# Patient Record
Sex: Female | Born: 1960
Health system: Southern US, Community
[De-identification: ages and names within clinical notes are randomized; demographics above are authoritative.]

## PROBLEM LIST (undated history)

## (undated) DIAGNOSIS — I10 Essential (primary) hypertension: Secondary | ICD-10-CM

## (undated) DIAGNOSIS — M1990 Unspecified osteoarthritis, unspecified site: Secondary | ICD-10-CM

## (undated) DIAGNOSIS — J349 Unspecified disorder of nose and nasal sinuses: Secondary | ICD-10-CM

## (undated) DIAGNOSIS — M2550 Pain in unspecified joint: Secondary | ICD-10-CM

## (undated) DIAGNOSIS — N644 Mastodynia: Secondary | ICD-10-CM

## (undated) DIAGNOSIS — K219 Gastro-esophageal reflux disease without esophagitis: Secondary | ICD-10-CM

## (undated) DIAGNOSIS — U071 COVID-19: Secondary | ICD-10-CM

## (undated) HISTORY — DX: Pain in unspecified joint: M25.50

## (undated) HISTORY — DX: Mastodynia: N64.4

## (undated) HISTORY — DX: COVID-19: U07.1

## (undated) HISTORY — PX: COLONOSCOPY: SHX174

## (undated) HISTORY — DX: Unspecified disorder of nose and nasal sinuses: J34.9

## (undated) HISTORY — DX: Unspecified osteoarthritis, unspecified site: M19.90

---

## 2003-11-02 ENCOUNTER — Ambulatory Visit: Payer: Self-pay | Admitting: Family Medicine

## 2003-11-07 ENCOUNTER — Ambulatory Visit: Payer: Self-pay | Admitting: Family Medicine

## 2004-11-28 ENCOUNTER — Ambulatory Visit: Payer: Self-pay | Admitting: Family Medicine

## 2005-05-16 ENCOUNTER — Ambulatory Visit: Payer: Self-pay | Admitting: Internal Medicine

## 2005-06-16 ENCOUNTER — Ambulatory Visit: Payer: Self-pay

## 2006-01-08 ENCOUNTER — Ambulatory Visit: Payer: Self-pay

## 2007-02-11 ENCOUNTER — Ambulatory Visit: Payer: Self-pay | Admitting: Family Medicine

## 2007-07-08 DIAGNOSIS — J309 Allergic rhinitis, unspecified: Secondary | ICD-10-CM | POA: Insufficient documentation

## 2008-02-15 ENCOUNTER — Ambulatory Visit: Payer: Self-pay | Admitting: Family Medicine

## 2009-01-27 HISTORY — PX: COLONOSCOPY: SHX174

## 2009-02-15 ENCOUNTER — Ambulatory Visit: Payer: Self-pay

## 2009-04-17 ENCOUNTER — Ambulatory Visit: Payer: Self-pay | Admitting: Gastroenterology

## 2009-04-17 LAB — HM COLONOSCOPY

## 2010-03-13 ENCOUNTER — Ambulatory Visit: Payer: Self-pay

## 2011-03-19 ENCOUNTER — Ambulatory Visit: Payer: Self-pay

## 2011-09-25 ENCOUNTER — Ambulatory Visit: Payer: Self-pay | Admitting: Internal Medicine

## 2012-03-23 ENCOUNTER — Ambulatory Visit: Payer: Self-pay

## 2012-11-11 ENCOUNTER — Ambulatory Visit: Payer: Self-pay | Admitting: Internal Medicine

## 2013-04-01 ENCOUNTER — Ambulatory Visit: Payer: Self-pay

## 2014-04-03 ENCOUNTER — Ambulatory Visit: Payer: Self-pay | Admitting: Family Medicine

## 2014-06-19 ENCOUNTER — Other Ambulatory Visit: Payer: Self-pay | Admitting: Otolaryngology

## 2014-06-19 DIAGNOSIS — R1310 Dysphagia, unspecified: Secondary | ICD-10-CM

## 2014-06-29 ENCOUNTER — Ambulatory Visit
Admission: RE | Admit: 2014-06-29 | Discharge: 2014-06-29 | Disposition: A | Discharge status: Home / Self Care | Payer: BLUE CROSS/BLUE SHIELD | Source: Ambulatory Visit | Attending: Otolaryngology | Admitting: Otolaryngology

## 2014-06-29 DIAGNOSIS — R1310 Dysphagia, unspecified: Secondary | ICD-10-CM | POA: Insufficient documentation

## 2014-06-29 DIAGNOSIS — R1314 Dysphagia, pharyngoesophageal phase: Secondary | ICD-10-CM

## 2014-06-29 NOTE — Therapy (Addendum)
[facility] DIAGNOSTIC RADIOLOGY [address] [place], [place], [address] Phone: [phone]   Fax:     Modified Barium Swallow  Patient Details  Name: Susan Allison MRN: [id number] Date of Birth: 1961/01/14 Referring Provider:  Bud Face, MD  Encounter Date: 06/29/2014   Subjective: Patient behavior: (alertness, ability to follow instructions, etc.): Pt A/O x4, verbally conversive. Followed all instructions.  Chief complaint: feeling of having to swallow "over something" when swallowing during meals and even w/ just saliva; further description of globus feeling. Pt denied any overt s/s of aspiration (coughing or choking w/ po's) or declined respiratory status in the past 6-12 months.   Objective:  Radiological Procedure: A videoflouroscopic evaluation of oral-preparatory, reflex initiation, and pharyngeal phases of the swallow was performed; as well as a screening of the upper esophageal phase.  I. POSTURE: upright II. VIEW: lateral; A-P  III. COMPENSATORY STRATEGIES: none IV. BOLUSES ADMINISTERED:  Thin Liquid: 6 via cup  Nectar-thick Liquid: 1 via cup  Honey-thick Liquid: n/a  Puree: 3 via tsp  Mechanical Soft: 2 trials V. RESULTS OF EVALUATION: A. ORAL PREPARATORY PHASE: (The lips, tongue, and velum are observed for strength and coordination)       **Overall Severity Rating: WFL.  Pt demo. adequate oral phase c/b good bolus control and timely oral control/management w/ timely A-P transfer of bolus material; appropriate oral clearing post swallowing.  B. SWALLOW INITIATION/REFLEX: (The reflex is normal if "triggered" by the time the bolus reached the base of the tongue)  **Overall Severity Rating: [facility].  Pt exhibited adequate pharyngeal swallow initiation w/ all consistencies; thin liquids tended to trigger the swallow at/slightly spilling from the valleculae. No laryngeal penetration or aspiration occurred during this study.    C. PHARYNGEAL PHASE: (Pharyngeal function is normal if the bolus shows rapid, smooth, and continuous transit through the pharynx and there is no pharyngeal residue after the swallow)  **Overall Severity Rating: [employer].  Bolus material cleared the pharynx w/ each trial consistency; no residue remained indicating adequate laryngeal excursion and pharyngeal pressure.   D. LARYNGEAL PENETRATION: (Material entering into the laryngeal inlet/vestibule but not aspirated): NONE  E. ASPIRATION: NONE F. ESOPHAGEAL PHASE: (Screening of the upper esophagus): Pt exhibited decreased Esophageal motility w/ trials of solids; bolus material remained in the Esophagus longer.  G. ASSESSMENT:  Pt appeared to present w/ a wfl oropharyngeal swallow function during this exam today. No oral phase deficits noted, and pharyngeal phase of swallowing was adequate in it's timeliness of the swallow initiation as well as the bolus transit and clearing of the pharynx. During the Esophageal phase, pt exhibited decreased Esophageal motility w/ trials of solids; bolus material remained in the Esophagus longer. This may be impacting the pharyngeal phase of pt's swallowing giving her the "globus feeling" she describes as her complaint bringing her in for this study today. Education was given on the Esophageal phase of swallowing; pt viewed the study results as well. General education was given on aspiration and Reflux precautions.    PLAN/RECOMMENDATIONS:  A. Diet: Regular; thin liquids  B. Swallowing Precautions: general aspiration precautions; Reflux precautions  C. Recommended consultation to GI for full Esophageal assessment as indicated  D. Therapy recommendations: none  E. Results and recommendations were explained to pt; faxed to MD.      End of Session - 06/29/14 1426    Visit Number 1   Number of Visits 1   SLP Start Time 1300   SLP Stop  Time  1400   SLP Time Calculation (min) 60 min   Activity Tolerance Patient  tolerated treatment well      No past medical history on file.  No past surgical history on file.  There were no vitals filed for this visit.  Visit Diagnosis: Pharyngoesophageal dysphagia  Dysphagia - Plan: DG SWALLOWING FUNC-SPEECH PATHOLOGY, DG SWALLOWING FUNC-SPEECH PATHOLOGY                               Problem List There are no active problems to display for this patient.   Mccormick Macon 06/29/2014, 2:32 PM  [facility] DIAGNOSTIC RADIOLOGY [address] [place], [place], [address] Phone: [phone]   Fax:

## 2014-08-14 ENCOUNTER — Encounter
Admission: RE | Disposition: A | Discharge status: Home Health | Payer: Self-pay | Source: Ambulatory Visit | Attending: Gastroenterology

## 2014-08-14 ENCOUNTER — Ambulatory Visit: Payer: BLUE CROSS/BLUE SHIELD | Admitting: Anesthesiology

## 2014-08-14 ENCOUNTER — Ambulatory Visit
Admission: RE | Admit: 2014-08-14 | Discharge: 2014-08-14 | Disposition: A | Discharge status: Home Health | Payer: BLUE CROSS/BLUE SHIELD | Source: Ambulatory Visit | Attending: Gastroenterology | Admitting: Gastroenterology

## 2014-08-14 ENCOUNTER — Encounter: Payer: Self-pay | Admitting: Anesthesiology

## 2014-08-14 DIAGNOSIS — R1310 Dysphagia, unspecified: Secondary | ICD-10-CM | POA: Insufficient documentation

## 2014-08-14 DIAGNOSIS — K317 Polyp of stomach and duodenum: Secondary | ICD-10-CM | POA: Insufficient documentation

## 2014-08-14 DIAGNOSIS — K219 Gastro-esophageal reflux disease without esophagitis: Secondary | ICD-10-CM | POA: Insufficient documentation

## 2014-08-14 DIAGNOSIS — K224 Dyskinesia of esophagus: Secondary | ICD-10-CM | POA: Diagnosis not present

## 2014-08-14 DIAGNOSIS — R1013 Epigastric pain: Secondary | ICD-10-CM | POA: Diagnosis present

## 2014-08-14 DIAGNOSIS — Z79899 Other long term (current) drug therapy: Secondary | ICD-10-CM | POA: Diagnosis not present

## 2014-08-14 HISTORY — DX: Gastro-esophageal reflux disease without esophagitis: K21.9

## 2014-08-14 HISTORY — PX: ESOPHAGOGASTRODUODENOSCOPY: SHX5428

## 2014-08-14 SURGERY — EGD (ESOPHAGOGASTRODUODENOSCOPY)
Anesthesia: General

## 2014-08-14 MED ORDER — FENTANYL CITRATE (PF) 100 MCG/2ML IJ SOLN
INTRAMUSCULAR | Status: DC | PRN
  Administered 2014-08-14: 50 ug via INTRAVENOUS

## 2014-08-14 MED ORDER — SODIUM CHLORIDE 0.9 % IV SOLN
INTRAVENOUS | Status: DC
  Administered 2014-08-14: 13:00:00 via INTRAVENOUS

## 2014-08-14 MED ORDER — SODIUM CHLORIDE 0.9 % IV SOLN: INTRAVENOUS | Status: DC

## 2014-08-14 MED ORDER — PROPOFOL 10 MG/ML IV BOLUS
INTRAVENOUS | Status: DC | PRN
  Administered 2014-08-14: 50 mg via INTRAVENOUS

## 2014-08-14 MED ORDER — PROPOFOL INFUSION 10 MG/ML OPTIME
INTRAVENOUS | Status: DC | PRN
  Administered 2014-08-14: 75 ug/kg/min via INTRAVENOUS

## 2014-08-14 NOTE — Anesthesia Preprocedure Evaluation (Addendum)
Anesthesia Evaluation  Patient identified by MRN, date of birth, ID band Patient awake    Reviewed: Allergy & Precautions, H&P , NPO status , Patient's Chart, lab work & pertinent test results, reviewed documented beta blocker date and time   Airway Mallampati: II  TM Distance: >3 FB Neck ROM: full    Dental no notable dental hx. (+) Teeth Intact   Pulmonary neg pulmonary ROS,  breath sounds clear to auscultation  Pulmonary exam normal       Cardiovascular negative cardio ROS Normal cardiovascular examRhythm:regular Rate:Normal     Neuro/Psych negative neurological ROS  negative psych ROS   GI/Hepatic Neg liver ROS, GERD-  Controlled and Medicated,  Endo/Other  negative endocrine ROS  Renal/GU negative Renal ROS  negative genitourinary   Musculoskeletal   Abdominal   Peds  Hematology negative hematology ROS (+)   Anesthesia Other Findings Past Medical History:   Ear infection                                                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   Dental Advisory Given  Plan Discussed with: Anesthesiologist, CRNA and Surgeon  Anesthesia Plan Comments:        Anesthesia Quick Evaluation

## 2014-08-14 NOTE — Transfer of Care (Signed)
Immediate Anesthesia Transfer of Care Note  Patient: Susan Allison  Procedure(s) Performed: Procedure(s): ESOPHAGOGASTRODUODENOSCOPY (EGD) (N/A)  Patient Location: PACU and Endoscopy Unit  Anesthesia Type:General  Level of Consciousness: sedated  Airway & Oxygen Therapy: Patient Spontanous Breathing  Post-op Assessment: Report given to RN  Post vital signs: stable  Last Vitals:  Filed Vitals:   08/14/14 1350  BP: 101/60  Pulse: 79  Temp: 35.7 C  Resp: 21    Complications: No apparent anesthesia complications

## 2014-08-14 NOTE — Op Note (Signed)
[facility] Gastroenterology Patient Name: Susan Allison Procedure Date: 08/14/2014 1:06 PM MRN: [id number] Account #: [id number] Date of Birth: 09/17/1960 Admit Type: Outpatient Age: 54 [facility] ENDO ROOM 4 Gender: Female Note Status: Finalized Procedure:         Upper GI endoscopy Indications:       Epigastric abdominal pain, Dysphagia, Suspected esophageal                     reflux Providers:         Ezzard StandingPaul Y. Bluford Kaufmannh, MD Referring MD:      Serita ShellerErnest B. Maryellen PileEason, MD (Referring MD) Medicines:         Monitored Anesthesia Care Complications:     No immediate complications. Procedure:         Pre-Anesthesia Assessment:                    - Prior to the procedure, a History and Physical was                     performed, and patient medications, allergies and                     sensitivities were reviewed. The patient's tolerance of                     previous anesthesia was reviewed.                    - The risks and benefits of the procedure and the sedation                     options and risks were discussed with the patient. All                     questions were answered and informed consent was obtained.                    - After reviewing the risks and benefits, the patient was                     deemed in satisfactory condition to undergo the procedure.                    After obtaining informed consent, the endoscope was passed                     under direct vision. Throughout the procedure, the                     patient's blood pressure, pulse, and oxygen saturations                     were monitored continuously. The Olympus GIF-160 endoscope                     (S#. [id number]) was introduced through the mouth, and                     advanced to the second part of duodenum. The upper GI                     endoscopy was accomplished without difficulty. The patient  tolerated the procedure well. Findings:      No endoscopic  abnormality was evident in the esophagus to explain the       patient's complaint of dysphagia. It was decided, however, to proceed       with dilation of the entire esophagus. The scope was withdrawn. Dilation       was performed with a Maloney dilator with mild resistance at 54 Fr.      A few small sessile polyps were found in the gastric body.      The exam was otherwise without abnormality.      The examined duodenum was normal. Impression:        - No endoscopic esophageal abnormality to explain                     patient's dysphagia. Esophagus dilated. Dilated.                    - A few gastric polyps.                    - The examination was otherwise normal.                    - Normal examined duodenum.                    - No specimens collected. Recommendation:    - Discharge patient to home.                    - Observe patient's clinical course.                    - The findings and recommendations were discussed with the                     patient.                    - Switch to daily PPI Procedure Code(s): --- Professional ---                    [phone], Esophagogastroduodenoscopy, flexible, transoral;                     diagnostic, including collection of specimen(s) by                     brushing or washing, when performed (separate procedure)                    43450, Dilation of esophagus, by unguided sound or bougie,                     single or multiple passes Diagnosis Code(s): --- Professional ---                    R13.10, Dysphagia, unspecified                    K31.7, Polyp of stomach and duodenum                    R10.13, Epigastric pain CPT copyright 2014 American Medical Association. All rights reserved. The codes documented in this report are preliminary and upon coder review may  be revised to meet current compliance requirements. [name], MD 08/14/2014 1:38:10 PM This report has been signed  electronically. Number of Addenda: 0 Note Initiated  On: 08/14/2014 1:06 PM      [facility]

## 2014-08-14 NOTE — H&P (Signed)
  Date of Initial H&P: 07/20/2014 History reviewed, patient examined, no change in status, stable for surgery. 

## 2014-08-14 NOTE — Anesthesia Postprocedure Evaluation (Signed)
  Anesthesia Post-op Note  Patient: Susan Allison  Procedure(s) Performed: Procedure(s): ESOPHAGOGASTRODUODENOSCOPY (EGD) (N/A)  Anesthesia type:General  Patient location: PACU  Post pain: Pain level controlled  Post assessment: Post-op Vital signs reviewed, Patient's Cardiovascular Status Stable, Respiratory Function Stable, Patent Airway and No signs of Nausea or vomiting  Post vital signs: Reviewed and stable  Last Vitals:  Filed Vitals:   08/14/14 1420  BP: 120/74  Pulse: 75  Temp:   Resp: 18    Level of consciousness: awake, alert  and patient cooperative  Complications: No apparent anesthesia complications

## 2014-08-21 ENCOUNTER — Encounter: Payer: Self-pay | Admitting: Gastroenterology

## 2015-02-27 LAB — HM PAP SMEAR: HM PAP: NORMAL

## 2015-03-14 ENCOUNTER — Other Ambulatory Visit: Payer: Self-pay | Admitting: Obstetrics and Gynecology

## 2015-03-14 DIAGNOSIS — Z1231 Encounter for screening mammogram for malignant neoplasm of breast: Secondary | ICD-10-CM

## 2015-04-04 ENCOUNTER — Ambulatory Visit
Admission: RE | Admit: 2015-04-04 | Discharge: 2015-04-04 | Disposition: A | Discharge status: Home / Self Care | Payer: BLUE CROSS/BLUE SHIELD | Source: Ambulatory Visit | Attending: Obstetrics and Gynecology | Admitting: Obstetrics and Gynecology

## 2015-04-04 DIAGNOSIS — Z1231 Encounter for screening mammogram for malignant neoplasm of breast: Secondary | ICD-10-CM | POA: Diagnosis not present

## 2016-03-04 ENCOUNTER — Other Ambulatory Visit: Payer: Self-pay | Admitting: Obstetrics and Gynecology

## 2016-03-20 DIAGNOSIS — Z01419 Encounter for gynecological examination (general) (routine) without abnormal findings: Secondary | ICD-10-CM | POA: Diagnosis not present

## 2016-03-20 DIAGNOSIS — Z1239 Encounter for other screening for malignant neoplasm of breast: Secondary | ICD-10-CM | POA: Diagnosis not present

## 2016-03-31 ENCOUNTER — Other Ambulatory Visit: Payer: Self-pay | Admitting: Obstetrics and Gynecology

## 2016-03-31 DIAGNOSIS — Z1231 Encounter for screening mammogram for malignant neoplasm of breast: Secondary | ICD-10-CM

## 2016-04-15 DIAGNOSIS — K5900 Constipation, unspecified: Secondary | ICD-10-CM | POA: Diagnosis not present

## 2016-04-15 DIAGNOSIS — D500 Iron deficiency anemia secondary to blood loss (chronic): Secondary | ICD-10-CM | POA: Diagnosis not present

## 2016-04-15 DIAGNOSIS — Z0000 Encounter for general adult medical examination without abnormal findings: Secondary | ICD-10-CM | POA: Diagnosis not present

## 2016-04-15 DIAGNOSIS — E559 Vitamin D deficiency, unspecified: Secondary | ICD-10-CM | POA: Diagnosis not present

## 2016-04-15 DIAGNOSIS — E782 Mixed hyperlipidemia: Secondary | ICD-10-CM | POA: Diagnosis not present

## 2016-04-15 DIAGNOSIS — K760 Fatty (change of) liver, not elsewhere classified: Secondary | ICD-10-CM | POA: Diagnosis not present

## 2016-04-23 ENCOUNTER — Other Ambulatory Visit: Payer: Self-pay | Admitting: Otolaryngology

## 2016-04-23 DIAGNOSIS — H9209 Otalgia, unspecified ear: Secondary | ICD-10-CM | POA: Diagnosis not present

## 2016-04-29 ENCOUNTER — Ambulatory Visit
Admission: RE | Admit: 2016-04-29 | Discharge: 2016-04-29 | Disposition: A | Discharge status: Home / Self Care | Payer: BLUE CROSS/BLUE SHIELD | Source: Ambulatory Visit | Attending: Obstetrics and Gynecology | Admitting: Obstetrics and Gynecology

## 2016-04-29 ENCOUNTER — Encounter: Payer: Self-pay | Admitting: Obstetrics and Gynecology

## 2016-04-29 DIAGNOSIS — Z1231 Encounter for screening mammogram for malignant neoplasm of breast: Secondary | ICD-10-CM | POA: Diagnosis not present

## 2016-04-30 ENCOUNTER — Ambulatory Visit
Admission: RE | Admit: 2016-04-30 | Discharge: 2016-04-30 | Disposition: A | Discharge status: Home / Self Care | Payer: BLUE CROSS/BLUE SHIELD | Source: Ambulatory Visit | Attending: Otolaryngology | Admitting: Otolaryngology

## 2016-04-30 DIAGNOSIS — R930 Abnormal findings on diagnostic imaging of skull and head, not elsewhere classified: Secondary | ICD-10-CM | POA: Diagnosis not present

## 2016-04-30 DIAGNOSIS — H9202 Otalgia, left ear: Secondary | ICD-10-CM | POA: Insufficient documentation

## 2016-04-30 DIAGNOSIS — R51 Headache: Secondary | ICD-10-CM | POA: Diagnosis not present

## 2016-04-30 DIAGNOSIS — H9209 Otalgia, unspecified ear: Secondary | ICD-10-CM

## 2016-04-30 MED ORDER — IOPAMIDOL (ISOVUE-300) INJECTION 61%
75.0000 mL | Freq: Once | INTRAVENOUS | Status: AC | PRN
Start: 2016-04-30 — End: 2016-04-30
  Administered 2016-04-30: 75 mL via INTRAVENOUS

## 2016-06-25 DIAGNOSIS — H40003 Preglaucoma, unspecified, bilateral: Secondary | ICD-10-CM | POA: Diagnosis not present

## 2016-07-02 DIAGNOSIS — H40003 Preglaucoma, unspecified, bilateral: Secondary | ICD-10-CM | POA: Diagnosis not present

## 2016-09-23 DIAGNOSIS — H6982 Other specified disorders of Eustachian tube, left ear: Secondary | ICD-10-CM | POA: Diagnosis not present

## 2017-02-27 DIAGNOSIS — M1990 Unspecified osteoarthritis, unspecified site: Secondary | ICD-10-CM | POA: Diagnosis not present

## 2017-02-27 DIAGNOSIS — R031 Nonspecific low blood-pressure reading: Secondary | ICD-10-CM | POA: Diagnosis not present

## 2017-02-27 DIAGNOSIS — R011 Cardiac murmur, unspecified: Secondary | ICD-10-CM | POA: Diagnosis not present

## 2017-03-03 ENCOUNTER — Ambulatory Visit: Payer: Self-pay | Admitting: Medical

## 2017-03-03 VITALS — BP 118/78 | HR 80 | Temp 97.5°F | Resp 16 | Wt 129.8 lb

## 2017-03-03 DIAGNOSIS — Z0130 Encounter for examination of blood pressure without abnormal findings: Secondary | ICD-10-CM

## 2017-03-03 DIAGNOSIS — Z8669 Personal history of other diseases of the nervous system and sense organs: Secondary | ICD-10-CM

## 2017-03-03 NOTE — Progress Notes (Signed)
   Subjective:    Patient ID: Susan Allison, female    DOB: [birth year], 57 y.o.   MRN: [id number]  HPI  57 yo female in non acute distress Came in for blood pressure check for her doctor . Wanted ears checked for wax.   Review of Systems  body aches being worked up for  Rhematoid Arthritis  by her doctor.  Pending lab work. Sister with a hix of RA.    Objective:   Physical Exam    Bilateral ears within normal limits no wax.    Assessment & Plan:  Ear check , blood pressure check Return to the clinic as needed for blood pressure check per your doctor.  BP 118/78 today. Return to clinic as needed. Patient verbalizes understanding and has no questions at this time.

## 2017-03-23 ENCOUNTER — Encounter: Payer: Self-pay | Admitting: Obstetrics and Gynecology

## 2017-03-23 ENCOUNTER — Ambulatory Visit (INDEPENDENT_AMBULATORY_CARE_PROVIDER_SITE_OTHER): Payer: BLUE CROSS/BLUE SHIELD | Admitting: Obstetrics and Gynecology

## 2017-03-23 VITALS — BP 138/96 | Ht 59.0 in | Wt 131.0 lb

## 2017-03-23 DIAGNOSIS — Z1231 Encounter for screening mammogram for malignant neoplasm of breast: Secondary | ICD-10-CM | POA: Diagnosis not present

## 2017-03-23 DIAGNOSIS — Z01419 Encounter for gynecological examination (general) (routine) without abnormal findings: Secondary | ICD-10-CM

## 2017-03-23 DIAGNOSIS — Z1239 Encounter for other screening for malignant neoplasm of breast: Secondary | ICD-10-CM

## 2017-03-23 NOTE — Progress Notes (Signed)
PCP: Derwood KaplanEason, Ernest B, MD   Chief Complaint  Patient presents with  . Annual Exam    HPI:      [name] is a 57 y.o. [phone] who LMP was No LMP recorded. Patient is postmenopausal., presents today for her annual examination.  Her menses are absent due to menopause. She does not have intermenstrual bleeding. She does have occas vasomotor sx.   Sex activity: single partner, contraception - post menopausal status. She does not have vaginal dryness.  Last Pap: February 27, 2015  Results were: no abnormalities /neg HPV DNA.  Hx of STDs: none  Last mammogram: April 29, 2016  Results were: normal--routine follow-up in 12 months There is a FH of breast cancer in her mat niece, genetic testing not indicated for pt. There is no FH of ovarian cancer. The patient does do self-breast exams.  Colonoscopy: colonoscopy 8 years ago without abnormalities.  Repeat due after 5 years.   Tobacco use: The patient denies current or previous tobacco use. Alcohol use: none Exercise: moderately active  She does get adequate calcium and Vitamin D in her diet.  Labs with PCP. Having some elevated BP readings recently.   Past Medical History:  Diagnosis Date  . Ear infection   . GERD (gastroesophageal reflux disease)   . Mastodynia     Past Surgical History:  Procedure Laterality Date  . COLONOSCOPY    . ESOPHAGOGASTRODUODENOSCOPY N/A 08/14/2014   Procedure: ESOPHAGOGASTRODUODENOSCOPY (EGD);  Surgeon: Wallace CullensPaul Y Oh, MD;  [facility];  Service: Gastroenterology;  Laterality: N/A;    Family History  Problem Relation Age of Onset  . Breast cancer Other        40's  . Hypertension Mother   . Liver disease Mother   . Prostate cancer Father     Social History   Socioeconomic History  . Marital status: Married    Spouse name: Not on file  . Number of children: Not on file  . Years of education: Not on file  . Highest education level: Not on file  Social Needs  . Financial  resource strain: Not on file  . Food insecurity - worry: Not on file  . Food insecurity - inability: Not on file  . Transportation needs - medical: Not on file  . Transportation needs - non-medical: Not on file  Occupational History  . Not on file  Tobacco Use  . Smoking status: Never Smoker  . Smokeless tobacco: Never Used  Substance and Sexual Activity  . Alcohol use: No  . Drug use: No  . Sexual activity: Yes    Birth control/protection: Post-menopausal  Other Topics Concern  . Not on file  Social History Narrative  . Not on file    Current Meds  Medication Sig  . calcium carbonate (OS-CAL) 600 MG TABS tablet Take 600 mg by mouth 2 (two) times daily with a meal.  . cetirizine (ZYRTEC) 10 MG tablet Take 10 mg by mouth daily.  . Vitamin D, Ergocalciferol, (DRISDOL) 50000 units CAPS capsule Take 50,000 Units by mouth every 7 (seven) days.      ROS:  Review of Systems  Constitutional: Negative for fatigue, fever and unexpected weight change.  Respiratory: Negative for cough, shortness of breath and wheezing.   Cardiovascular: Negative for chest pain, palpitations and leg swelling.  Gastrointestinal: Negative for blood in stool, constipation, diarrhea, nausea and vomiting.  Endocrine: Negative for cold intolerance, heat intolerance and polyuria.  Genitourinary: Negative for  dyspareunia, dysuria, flank pain, frequency, genital sores, hematuria, menstrual problem, pelvic pain, urgency, vaginal bleeding, vaginal discharge and vaginal pain.  Musculoskeletal: Positive for arthralgias. Negative for back pain, joint swelling and myalgias.  Skin: Negative for rash.  Neurological: Positive for headaches. Negative for dizziness, syncope, light-headedness and numbness.  Hematological: Negative for adenopathy.  Psychiatric/Behavioral: Negative for agitation, confusion, sleep disturbance and suicidal ideas. The patient is not nervous/anxious.      Objective: BP (!) 138/96   Ht 4'  11" (1.499 m)   Wt 131 lb (59.4 kg)   BMI 26.46 kg/m    Physical Exam  Constitutional: She is oriented to person, place, and time. She appears well-developed and well-nourished.  Genitourinary: Vagina normal and uterus normal. There is no rash or tenderness on the right labia. There is no rash or tenderness on the left labia. No erythema or tenderness in the vagina. No vaginal discharge found. Right adnexum does not display mass and does not display tenderness. Left adnexum does not display mass and does not display tenderness. Cervix does not exhibit motion tenderness or polyp. Uterus is not enlarged or tender.  Neck: Normal range of motion. No thyromegaly present.  Cardiovascular: Normal rate, regular rhythm and normal heart sounds.  No murmur heard. Pulmonary/Chest: Effort normal and breath sounds normal. Right breast exhibits no mass, no nipple discharge, no skin change and no tenderness. Left breast exhibits no mass, no nipple discharge, no skin change and no tenderness.  Abdominal: Soft. There is no tenderness. There is no guarding.  Musculoskeletal: Normal range of motion.  Neurological: She is alert and oriented to person, place, and time. No cranial nerve deficit.  Psychiatric: She has a normal mood and affect. Her behavior is normal.  Vitals reviewed.  Assessment/Plan:  Encounter for annual routine gynecological examination  Screening for breast cancer - Pt to sched mammo. - Plan: MM DIGITAL SCREENING BILATERAL          GYN counsel breast self exam, mammography screening, menopause, adequate intake of calcium and vitamin D, diet and exercise    F/U  Return in about 1 year (around 03/23/2018).  Alicia B. Copland, PA-C 03/23/2017 8:34 AM

## 2017-03-23 NOTE — Patient Instructions (Addendum)
I value your feedback and entrusting us with your care. If you get a [facility] patient survey, I would appreciate you taking the time to let us know about your experience today. Thank you!  [facility]: [phone]  [facility]: [phone]  

## 2017-04-01 DIAGNOSIS — R030 Elevated blood-pressure reading, without diagnosis of hypertension: Secondary | ICD-10-CM | POA: Diagnosis not present

## 2017-04-09 DIAGNOSIS — R748 Abnormal levels of other serum enzymes: Secondary | ICD-10-CM | POA: Diagnosis not present

## 2017-04-09 DIAGNOSIS — I10 Essential (primary) hypertension: Secondary | ICD-10-CM | POA: Diagnosis not present

## 2017-04-28 ENCOUNTER — Ambulatory Visit
Admission: RE | Admit: 2017-04-28 | Discharge: 2017-04-28 | Disposition: A | Discharge status: Home / Self Care | Payer: BLUE CROSS/BLUE SHIELD | Source: Ambulatory Visit | Attending: Medical | Admitting: Medical

## 2017-04-28 ENCOUNTER — Ambulatory Visit: Payer: BLUE CROSS/BLUE SHIELD | Admitting: Medical

## 2017-04-28 VITALS — BP 142/75 | HR 77 | Temp 97.5°F | Resp 16 | Wt 125.8 lb

## 2017-04-28 DIAGNOSIS — M79642 Pain in left hand: Secondary | ICD-10-CM

## 2017-04-28 MED ORDER — PREDNISONE 10 MG (21) PO TBPK: ORAL_TABLET | ORAL | 6 refills | Status: DC

## 2017-04-28 NOTE — Progress Notes (Signed)
   Subjective:    Patient ID: Susan Allison, female    DOB: 1960-08-17, 57 y.o.   MRN: [id number]  HPI 57 yo female in non acute distress.  Presents to day with complaints of left hand starting " for a while" 6 months historyt per patient. Tingling  In hand  all fingers.Swelling in base of fingers 1-3 rd fingers and into hand.  Patient is left handed. Has noticed some burning in fingers with using hand during work.   Review of Systems  Constitutional: Positive for chills (last week hot and cold went home early last week , resolved.). Negative for fever.  HENT: Positive for congestion and ear pain (left ear radiates from her  left TMJ). Negative for ear discharge and sore throat.   Respiratory: Negative for cough (dry cough, started the lisinopril started it 3 weeks ago  started by Dr. Laural BenesJohnson).    Dr. Maryellen PileEason retired and she just started seeing Dr. Laural BenesJohnson.  Sister with Rheumatoid Arthritis. Patient  was tested by her doctor. And she states the  tests came back negative.      Objective:   Physical Exam  Constitutional: She is oriented to person, place, and time.  Cardiovascular: Normal rate, regular rhythm and normal heart sounds.  Pulmonary/Chest: Effort normal and breath sounds normal. No respiratory distress. She has no wheezes. She has no rales.  Neurological: She is alert and oriented to person, place, and time.  Skin: Skin is warm and dry.  Psychiatric: She has a normal mood and affect. Her behavior is normal. Judgment and thought content normal.   No cough noted in room 5/5 grip strength  Most painful at the  2nd metacarpal with the most swelling and more painful than the  1st or 2nd metacarpal which have minimal swelling. No erthyma, some warmth. Able to have  FROM of fingers and able to oppose thumb without difficulty.  Tinels test negtive Phalens test  Painful at  2nd metacarpal site not into fingers).    Assessment & Plan:  Left hand pain  ( mostly at the 2nd  metacarpal). Most likely arthritis or tendonitis  x-ray  Of left hand, Will call patient in the morning with results. Meds ordered this encounter  Medications  . predniSONE (STERAPRED UNI-PAK 21 TAB) 10 MG (21) TBPK tablet    Sig: Take  6 tablets by mouth today then  5 tablets tomorrow then one less each day thereafter. Take with food.    Dispense:  21 tablet    Refill:  6  changed to  0 refills. I called pharmacy. [employer]  1st shift.  Using right hand more at work, declines restrictive note for work at this point.  Call your Dr. Laural BenesJohnson for recheck on hypertensive medication (cough to make follow up appointment,) and to recheck on her left hand.  CLINICAL DATA:  Worsening pain in the first, second, and third metacarpals.  EXAM: LEFT HAND - COMPLETE 3+ VIEW  COMPARISON:  None.  FINDINGS: No evidence for an acute fracture. No subluxation or dislocation. Foreshortening of the fourth metacarpal is chronic and may be developmental or related to remote trauma. No worrisome lytic or sclerotic osseous abnormality.  IMPRESSION: No acute bony abnormality.   Electronically Signed   By: Kennith CenterEric  Mansell M.D.  Called patient with results. Patient to follow up on  05/08/17. Patient verbalizes understanding and has no questions at discharge.

## 2017-04-28 NOTE — Patient Instructions (Signed)
Arthritis Arthritis means joint pain. It can also mean joint disease. A joint is a place where bones come together. People who have arthritis may have:  Red joints.  Swollen joints.  Stiff joints.  Warm joints.  A fever.  A feeling of being sick.  Follow these instructions at home: Pay attention to any changes in your symptoms. Take these actions to help with your pain and swelling. Medicines  Take over-the-counter and prescription medicines only as told by your doctor.  Do not take aspirin for pain if your doctor says that you may have gout. Activity  Rest your joint if your doctor tells you to.  Avoid activities that make the pain worse.  Exercise your joint regularly as told by your doctor. Try doing exercises like: ? Swimming. ? Water aerobics. ? Biking. ? Walking. Joint Care   If your joint is swollen, keep it raised (elevated) if told by your doctor.  If your joint feels stiff in the morning, try taking a warm shower.  If you have diabetes, do not apply heat without asking your doctor.  If told, apply heat to the joint: ? Put a towel between the joint and the hot pack or heating pad. ? Leave the heat on the area for 20-30 minutes.  If told, apply ice to the joint: ? Put ice in a plastic bag. ? Place a towel between your skin and the bag. ? Leave the ice on for 20 minutes, 2-3 times per day.  Keep all follow-up visits as told by your doctor. Contact a doctor if:  The pain gets worse.  You have a fever. Get help right away if:  You have very bad pain in your joint.  You have swelling in your joint.  Your joint is red.  Many joints become painful and swollen.  You have very bad back pain.  Your leg is very weak.  You cannot control your pee (urine) or poop (stool). This information is not intended to replace advice given to you by your health care provider. Make sure you discuss any questions you have with your health care provider. Document  Released: 04/09/2009 Document Revised: 06/21/2015 Document Reviewed: 04/10/2014 Elsevier Interactive Patient Education  2018 [employer]  

## 2017-04-29 ENCOUNTER — Telehealth: Payer: Self-pay | Admitting: *Deleted

## 2017-04-30 ENCOUNTER — Ambulatory Visit
Admission: RE | Admit: 2017-04-30 | Discharge: 2017-04-30 | Disposition: A | Discharge status: Home / Self Care | Payer: BLUE CROSS/BLUE SHIELD | Source: Ambulatory Visit | Attending: Obstetrics and Gynecology | Admitting: Obstetrics and Gynecology

## 2017-04-30 DIAGNOSIS — Z1239 Encounter for other screening for malignant neoplasm of breast: Secondary | ICD-10-CM

## 2017-04-30 DIAGNOSIS — Z1231 Encounter for screening mammogram for malignant neoplasm of breast: Secondary | ICD-10-CM | POA: Insufficient documentation

## 2017-05-01 ENCOUNTER — Encounter: Payer: Self-pay | Admitting: Obstetrics and Gynecology

## 2017-05-08 ENCOUNTER — Ambulatory Visit: Payer: BLUE CROSS/BLUE SHIELD | Admitting: Adult Health

## 2017-05-08 VITALS — BP 141/66 | HR 84 | Temp 97.9°F | Resp 16 | Ht 60.0 in | Wt 127.0 lb

## 2017-05-08 DIAGNOSIS — I10 Essential (primary) hypertension: Secondary | ICD-10-CM

## 2017-05-08 DIAGNOSIS — Z0189 Encounter for other specified special examinations: Secondary | ICD-10-CM

## 2017-05-08 DIAGNOSIS — M25532 Pain in left wrist: Secondary | ICD-10-CM | POA: Insufficient documentation

## 2017-05-08 DIAGNOSIS — R202 Paresthesia of skin: Secondary | ICD-10-CM | POA: Insufficient documentation

## 2017-05-08 MED ORDER — LISINOPRIL-HYDROCHLOROTHIAZIDE 10-12.5 MG PO TABS: 1.0000 | ORAL_TABLET | Freq: Every day | ORAL | 0 refills | Status: DC

## 2017-05-08 NOTE — Progress Notes (Addendum)
Subjective:     Patient ID: Susan Allison, female   DOB: [birth year], 57 y.o.   MRN: [id number]  HPI  Blood pressure (!) 141/66, pulse 84, temperature 97.9 F (36.6 C), resp. rate 16, height 5' (1.524 m), weight 127 lb (57.6 kg), SpO2 100 %.  Patient is a 57 year old female who comes to the clinic for recheck on her left hand, was diagnosed with tendonitis. She took prednisone dose pack and had no improvement. She notices with twisting, lifting or putting pressure on her hand that she has burning and stinging. She reports this has radiated from her hand to her lower arm at times with these activities. 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 Onset of symptoms " 6 months ago " or over per patient. Paresthesia from wrist to lower left forearm started since last office visit 04/28/16.  She reports she was in the 160's systolic before starting Lisinopril/HCTZ one month ago prescribed by Garnet KoyanagiJohnson, Julie R, FNP , she reports her MD Maryellen PileEason retired and she saw Garnet KoyanagiJohnson, Julie R, FNP as her new primary care. She reports she went to her doctor for her left hand and  blood pressure follow up ( [facility] as she had an appointment but office was not open and had a sign on the door closed. She has called this morning but unable to get office on the phone to speak with her provider. She requests information on primary care providers available if this does not work out with the other office.  She reports blood pressure at home has been 144/80 - is highest home reading on lisinopril other documented home readings patient brings with her while on antihypertensive medication is  119/76, 129/74. Heart rate ranging from 76 to 86.   She report she ran out of her blood pressure medication for the past three days and has not taken. She reports she feels lisinopril has given her a mild occasional dry cough that started after the medication. She denies any other  side effects.   Sister history of Rheumatoid Arthritis - she reports her tests all came back negative.   Patient  denies any fever, body aches,chills, rash, chest pain, shortness of breath, nausea, vomiting, or diarrhea currently.  Denies any recent infections, denies any known injury or trauma.   No LMP recorded. Patient is postmenopausal.   Review of Systems  Constitutional: Negative.   HENT: Negative.   Respiratory: Positive for cough (dry cough mild- intermittent since starting Lisinopril three weeks ago). Negative for apnea, choking, chest tightness, shortness of breath, wheezing and stridor.   Cardiovascular: Negative for chest pain, palpitations and leg swelling.  Gastrointestinal: Negative.   Genitourinary: Negative.   Musculoskeletal: Positive for arthralgias (" generalized " ). Negative for back pain, gait problem, joint swelling, myalgias, neck pain and neck stiffness.       Left hand pain - see HPI   Neurological: Negative.   Hematological: Negative.   Psychiatric/Behavioral: Negative.        Objective:   Physical Exam  Constitutional: She is oriented to person, place, and time. She appears well-developed and well-nourished.  HENT:  Head: Normocephalic and atraumatic.  Nose: Nose normal.  Mouth/Throat: Oropharynx is clear and moist.  Eyes: Pupils are equal, round, and reactive to light. Conjunctivae are normal.  Neck: Normal range of motion. Neck supple. No JVD present. No tracheal deviation present. No thyromegaly  present.  Cardiovascular: Normal rate, regular rhythm, normal heart sounds and intact distal pulses. Exam reveals no gallop and no friction rub.  No murmur heard. Pulmonary/Chest: Effort normal and breath sounds normal. No stridor. No respiratory distress. She has no wheezes. She has no rales. She exhibits no tenderness.  Abdominal: Soft. Bowel sounds are normal. She exhibits no distension and no mass. There is no tenderness. There is no rebound and no  guarding.  Musculoskeletal: Normal range of motion.       Left wrist: She exhibits tenderness.       Arms:      Left hand: She exhibits tenderness and swelling (minimal swelling metacarpal area ). She exhibits normal range of motion, no bony tenderness, normal two-point discrimination, normal capillary refill, no deformity and no laceration. Normal sensation noted. Normal strength noted. She exhibits no finger abduction, no thumb/finger opposition and no wrist extension trouble.   Most painful at the  2nd metacarpal with moderate  Swelling. 1st or 2nd metacarpal which have minimal swelling. No erythema. Full range of motion of  bilateral fingers and able to oppose thumb bilaterally  without difficulty. Rates pain 5/10   Tinels test negtive Phalens test painful 2nd and 3rd metacarpal not into finger.  5/5 grip strength  2+ left radial pulse. Skin appears normal color and texture. Normal skin temperature.  Lymphadenopathy:    She has no cervical adenopathy.  Neurological: She is alert and oriented to person, place, and time. She has normal reflexes.  Skin: Skin is warm and dry. No rash noted. No erythema. No pallor.  Psychiatric: She has a normal mood and affect. Her behavior is normal. Judgment and thought content normal.  Vitals reviewed.      Assessment:     Left wrist pain - Plan: Ambulatory referral to Orthopedic Surgery  Routine lab draw - Plan: Comprehensive metabolic panel, CBC w/Diff, TSH  Paresthesias in left hand - Plan: Ambulatory referral to Orthopedic Surgery  Paresthesia of left arm - Plan: Ambulatory referral to Orthopedic Surgery  Hypertension, unspecified type       Plan:      Meds ordered this encounter  Medications  . lisinopril-hydrochlorothiazide (PRINZIDE,ZESTORETIC) 10-12.5 MG tablet    Sig: Take 1 tablet by mouth daily.    Dispense:  15 tablet    Refill:  0   Orders Placed This Encounter  Procedures  . Comprehensive metabolic panel    Order  Specific Question:   Has the patient fasted?    Answer:   No  . CBC w/Diff  . TSH  . Ambulatory referral to Orthopedic Surgery    Referral Priority:   Routine    Referral Type:   Surgical    Referral Reason:   Specialty Services Required    Referred to Provider:   Dominica Severin, MD    Requested Specialty:   Orthopedic Surgery    Number of Visits Requested:   1       Advised she needs to follow up with Primary and if indeed office of Garnet Koyanagi, FNP is closed - gave information for Provider also recommends patient see primary care physician for a routine physical and to establish primary care. Patient may chose provider of choice. Also gave the [place]  PHYSICIAN REFERRAL LINE at [phone] or web site at [email or web address] to help assist with finding a primary care doctor. Patient understands this office is acute care and no longer taking new primary care patients. Patient verbalized  understanding of all instructions given and denies any further questions at this time.    Follow up after restarting blood pressure medication since today's reading is without medication for three days. Recheck next week and review of labs since no labs in Epic or available to provider. Discussed side effect of cough with lisinopril and since minimal will give bridge of 15 days until recheck and labs return and can discuss with supervising after labs regarding changing medication or having PCP if established or able to return to office of Garnet Koyanagi, FNP. Return to this office by next Thursday for recheck blood pressure and review of labs.   Advised patient call the office or your primary care doctor for an appointment if no improvement within 72 hours or if any symptoms change or worsen at any time  Advised ER or urgent Care if after hours or on weekend. Call 911 for emergency symptoms at any time.Patinet verbalized understanding of all instructions given/reviewed and treatment plan and has no  further questions or concerns at this time.   Patient verbalized understanding of all instructions given and denies any further questions at this time.

## 2017-05-08 NOTE — Patient Instructions (Signed)
Hypertension Hypertension, commonly called high blood pressure, is when the force of blood pumping through the arteries is too strong. The arteries are the blood vessels that carry blood from the heart throughout the body. Hypertension forces the heart to work harder to pump blood and may cause arteries to become narrow or stiff. Having untreated or uncontrolled hypertension can cause heart attacks, strokes, kidney disease, and other problems. A blood pressure reading consists of a higher number over a lower number. Ideally, your blood pressure should be below 120/80. The first ("top") number is called the systolic pressure. It is a measure of the pressure in your arteries as your heart beats. The second ("bottom") number is called the diastolic pressure. It is a measure of the pressure in your arteries as the heart relaxes. What are the causes? The cause of this condition is not known. What increases the risk? Some risk factors for high blood pressure are under your control. Others are not. Factors you can change  Smoking.  Having type 2 diabetes mellitus, high cholesterol, or both.  Not getting enough exercise or physical activity.  Being overweight.  Having too much fat, sugar, calories, or salt (sodium) in your diet.  Drinking too much alcohol. Factors that are difficult or impossible to change  Having chronic kidney disease.  Having a family history of high blood pressure.  Age. Risk increases with age.  Race. You may be at higher risk if you are African-American.  Gender. Men are at higher risk than women before age 47. After age 23, women are at higher risk than men.  Having obstructive sleep apnea.  Stress. What are the signs or symptoms? Extremely high blood pressure (hypertensive crisis) may cause:  Headache.  Anxiety.  Shortness of breath.  Nosebleed.  Nausea and vomiting.  Severe chest pain.  Jerky movements you cannot control (seizures).  How  is this diagnosed? This condition is diagnosed by measuring your blood pressure while you are seated, with your arm resting on a surface. The cuff of the blood pressure monitor will be placed directly against the skin of your upper arm at the level of your heart. It should be measured at least twice using the same arm. Certain conditions can cause a difference in blood pressure between your right and left arms. Certain factors can cause blood pressure readings to be lower or higher than normal (elevated) for a short period of time:  When your blood pressure is higher when you are in a health care provider's office than when you are at home, this is called white coat hypertension. Most people with this condition do not need medicines.  When your blood pressure is higher at home than when you are in a health care provider's office, this is called masked hypertension. Most people with this condition may need medicines to control blood pressure.  If you have a high blood pressure reading during one visit or you have normal blood pressure with other risk factors:  You may be asked to return on a different day to have your blood pressure checked again.  You may be asked to monitor your blood pressure at home for 1 week or longer.  If you are diagnosed with hypertension, you may have other blood or imaging tests to help your health care provider understand your overall risk for other conditions. How is this treated? This condition is treated by making healthy lifestyle changes, such as eating healthy foods, exercising more, and reducing your  alcohol intake. Your health care provider may prescribe medicine if lifestyle changes are not enough to get your blood pressure under control, and if:  Your systolic blood pressure is above 130.  Your diastolic blood pressure is above 80.  Your personal target blood pressure may vary depending on your medical conditions, your age, and other factors. Follow these  instructions at home: Eating and drinking  Eat a diet that is high in fiber and potassium, and low in sodium, added sugar, and fat. An example eating plan is called the DASH (Dietary Approaches to Stop Hypertension) diet. To eat this way: ? Eat plenty of fresh fruits and vegetables. Try to fill half of your plate at each meal with fruits and vegetables. ? Eat whole grains, such as whole wheat pasta, brown rice, or whole grain bread. Fill about one quarter of your plate with whole grains. ? Eat or drink low-fat dairy products, such as skim milk or low-fat yogurt. ? Avoid fatty cuts of meat, processed or cured meats, and poultry with skin. Fill about one quarter of your plate with lean proteins, such as fish, chicken without skin, beans, eggs, and tofu. ? Avoid premade and processed foods. These tend to be higher in sodium, added sugar, and fat.  Reduce your daily sodium intake. Most people with hypertension should eat less than 1,500 mg of sodium a day.  Limit alcohol intake to no more than 1 drink a day for nonpregnant women and 2 drinks a day for men. One drink equals 12 oz of beer, 5 oz of wine, or 1 oz of hard liquor. Lifestyle  Work with your health care provider to maintain a healthy body weight or to lose weight. Ask what an ideal weight is for you.  Get at least 30 minutes of exercise that causes your heart to beat faster (aerobic exercise) most days of the week. Activities may include walking, swimming, or biking.  Include exercise to strengthen your muscles (resistance exercise), such as pilates or lifting weights, as part of your weekly exercise routine. Try to do these types of exercises for 30 minutes at least 3 days a week.  Do not use any products that contain nicotine or tobacco, such as cigarettes and e-cigarettes. If you need help quitting, ask your health care provider.  Monitor your blood pressure at home as told by your health care provider.  Keep all follow-up visits as  told by your health care provider. This is important. Medicines  Take over-the-counter and prescription medicines only as told by your health care provider. Follow directions carefully. Blood pressure medicines must be taken as prescribed.  Do not skip doses of blood pressure medicine. Doing this puts you at risk for problems and can make the medicine less effective.  Ask your health care provider about side effects or reactions to medicines that you should watch for. Contact a health care provider if:  You think you are having a reaction to a medicine you are taking.  You have headaches that keep coming back (recurring).  You feel dizzy.  You have swelling in your ankles.  You have trouble with your vision. Get help right away if:  You develop a severe headache or confusion.  You have unusual weakness or numbness.  You feel faint.  You have severe pain in your chest or abdomen.  You vomit repeatedly.  You have trouble breathing. Summary  Hypertension is when the force of blood pumping through your arteries is too strong. If this  condition is not controlled, it may put you at risk for serious complications.  Your personal target blood pressure may vary depending on your medical conditions, your age, and other factors. For most people, a normal blood pressure is less than 120/80.  Hypertension is treated with lifestyle changes, medicines, or a combination of both. Lifestyle changes include weight loss, eating a healthy, low-sodium diet, exercising more, and limiting alcohol. This information is not intended to replace advice given to you by your health care provider. Make sure you discuss any questions you have with your health care provider. Document Released: 01/13/2005 Document Revised: 12/12/2015 Document Reviewed: 12/12/2015 Elsevier Interactive Patient Education  2018 [employer]. Possible Carpal Tunnel Syndrome Carpal tunnel syndrome is a condition that causes pain  in your hand and arm. The carpal tunnel is a narrow area that is on the palm side of your wrist. Repeated wrist motion or certain diseases may cause swelling in the tunnel. This swelling can pinch the main nerve in the wrist (median nerve). Follow these instructions at home: If you have a splint:  Wear it as told by your doctor. Remove it only as told by your doctor.  Loosen the splint if your fingers: ? Become numb and tingle. ? Turn blue and cold.  Keep the splint clean and dry. General instructions  Take over-the-counter and prescription medicines only as told by your doctor.  Rest your wrist from any activity that may be causing your pain. If needed, talk to your employer about changes that can be made in your work, such as getting a wrist pad to use while typing.  If directed, apply ice to the painful area: ? Put ice in a plastic bag. ? Place a towel between your skin and the bag. ? Leave the ice on for 20 minutes, 2-3 times per day.  Keep all follow-up visits as told by your doctor. This is important.  Do any exercises as told by your doctor, physical therapist, or occupational therapist. Contact a doctor if:  You have new symptoms.  Medicine does not help your pain.  Your symptoms get worse. This information is not intended to replace advice given to you by your health care provider. Make sure you discuss any questions you have with your health care provider. Document Released: 01/02/2011 Document Revised: 06/21/2015 Document Reviewed: 05/31/2014 Elsevier Interactive Patient Education  [employer]2018 Else[employer] Inc.

## 2017-05-09 LAB — CBC WITH DIFFERENTIAL/PLATELET
BASOS: 0 %
Basophils Absolute: 0 10*3/uL (ref 0.0–0.2)
EOS (ABSOLUTE): 0.2 10*3/uL (ref 0.0–0.4)
Eos: 5 %
HEMOGLOBIN: 12.4 g/dL (ref 11.1–15.9)
Hematocrit: 38 % (ref 34.0–46.6)
IMMATURE GRANS (ABS): 0 10*3/uL (ref 0.0–0.1)
Immature Granulocytes: 0 %
LYMPHS: 32 %
Lymphocytes Absolute: 1.3 10*3/uL (ref 0.7–3.1)
MCH: 27.9 pg (ref 26.6–33.0)
MCHC: 32.6 g/dL (ref 31.5–35.7)
MCV: 86 fL (ref 79–97)
MONOCYTES: 9 %
Monocytes Absolute: 0.3 10*3/uL (ref 0.1–0.9)
Neutrophils Absolute: 2.1 10*3/uL (ref 1.4–7.0)
Neutrophils: 54 %
Platelets: 260 10*3/uL (ref 150–379)
RBC: 4.44 x10E6/uL (ref 3.77–5.28)
RDW: 14.9 % (ref 12.3–15.4)
WBC: 3.9 10*3/uL (ref 3.4–10.8)

## 2017-05-09 LAB — COMPREHENSIVE METABOLIC PANEL
A/G RATIO: 2 (ref 1.2–2.2)
ALT: 11 IU/L (ref 0–32)
AST: 17 IU/L (ref 0–40)
Albumin: 4.4 g/dL (ref 3.5–5.5)
Alkaline Phosphatase: 86 IU/L (ref 39–117)
BUN/Creatinine Ratio: 11 (ref 9–23)
BUN: 11 mg/dL (ref 6–24)
Bilirubin Total: <0.2 mg/dL (ref 0.0–1.2)
CALCIUM: 9.3 mg/dL (ref 8.7–10.2)
CO2: 26 mmol/L (ref 20–29)
Chloride: 104 mmol/L (ref 96–106)
Creatinine, Ser: 0.97 mg/dL (ref 0.57–1.00)
GFR, EST AFRICAN AMERICAN: 74 mL/min/{1.73_m2} (ref >59)
GFR, EST NON AFRICAN AMERICAN: 65 mL/min/{1.73_m2} (ref >59)
GLUCOSE: 90 mg/dL (ref 65–99)
Globulin, Total: 2.2 g/dL (ref 1.5–4.5)
Potassium: 5.1 mmol/L (ref 3.5–5.2)
Sodium: 143 mmol/L (ref 134–144)
TOTAL PROTEIN: 6.6 g/dL (ref 6.0–8.5)

## 2017-05-09 LAB — TSH: TSH: 1.41 u[IU]/mL (ref 0.450–4.500)

## 2017-05-12 ENCOUNTER — Ambulatory Visit: Payer: BLUE CROSS/BLUE SHIELD | Admitting: Medical

## 2017-05-12 VITALS — BP 146/89 | HR 90 | Temp 98.1°F | Resp 16 | Wt 125.2 lb

## 2017-05-12 DIAGNOSIS — N3000 Acute cystitis without hematuria: Secondary | ICD-10-CM

## 2017-05-12 DIAGNOSIS — J0100 Acute maxillary sinusitis, unspecified: Secondary | ICD-10-CM

## 2017-05-12 DIAGNOSIS — E559 Vitamin D deficiency, unspecified: Secondary | ICD-10-CM

## 2017-05-12 DIAGNOSIS — R079 Chest pain, unspecified: Secondary | ICD-10-CM

## 2017-05-12 DIAGNOSIS — R300 Dysuria: Secondary | ICD-10-CM

## 2017-05-12 LAB — POCT URINALYSIS DIPSTICK
Bilirubin, UA: NEGATIVE
GLUCOSE UA: NEGATIVE
Ketones, UA: NEGATIVE
Nitrite, UA: 0.2
PROTEIN UA: NEGATIVE
Spec Grav, UA: 1.01 (ref 1.010–1.025)
Urobilinogen, UA: NEGATIVE E.U./dL — AB
pH, UA: 7.5 (ref 5.0–8.0)

## 2017-05-12 MED ORDER — AMOXICILLIN-POT CLAVULANATE 875-125 MG PO TABS: 1.0000 | ORAL_TABLET | Freq: Two times a day (BID) | ORAL | 0 refills | Status: DC

## 2017-05-12 NOTE — Progress Notes (Signed)
Subjective:    Patient ID: Susan Allison, female    DOB: 18-Feb-1960, 58 y.o.   MRN: [id number]  HPI 57 yo female in non acute distress returns to clinic for blood presssure recheck and review of labs. Patient restarted blood pressure medication on  (lisiniopril -HCTZ ). Picked up blood pressure medication on 05/08/17 and restarted medication that night.   Primary doctor Susan Allison was closed. So now patient has a appointment May 14th with Susan Rieger NP at [facility].    Review of Systems  Constitutional: Positive for fatigue. Negative for chills and fever.  HENT: Positive for ear pain (left mild). Negative for congestion and sore throat.   Eyes: Negative for discharge, itching and visual disturbance.  Respiratory: Positive for cough (comes and goes ) and shortness of breath.   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 In no pain now.Marland Kitchen).  Gastrointestinal: Negative for abdominal pain.  Genitourinary: Positive for dysuria (still feels like she has to go after going).  Musculoskeletal: Positive for myalgias (feels ill).  Skin: Negative for rash.  Allergic/Immunologic: Positive for environmental allergies. Negative for food allergies.  Neurological: Positive for dizziness (getting up quickly) and light-headedness. Negative for syncope.  Hematological: Negative for adenopathy.  Psychiatric/Behavioral: Negative for behavioral problems, self-injury and suicidal ideas.   Still having left hand pain that radiates upwards into arm.   Takes OTC Zyrtec ( takes it 3 days a week) and Astelin (Takes it some days).   Chest pain would start upper left side of back and then radiate to the front left sided chest.    Objective:   Physical Exam  Constitutional: She appears well-developed and well-nourished.  HENT:  Head: Normocephalic and atraumatic.  Right Ear: Hearing,  external ear and ear canal normal. A middle ear effusion is present.  Left Ear: Hearing, external ear and ear canal normal. A middle ear effusion is present.  Nose: Mucosal edema and rhinorrhea present.  Mouth/Throat: Uvula is midline, oropharynx is clear and moist and mucous membranes are normal.  Eyes: Pupils are equal, round, and reactive to light. Conjunctivae and EOM are normal.  Neck: Normal range of motion. Neck supple.  Cardiovascular: Normal rate and normal heart sounds.  Pulmonary/Chest: Effort normal and breath sounds normal.  Lymphadenopathy:    She has no cervical adenopathy.  Nursing note and vitals reviewed.  [name] gets on /off the exam table without difficulty , gait steady and wnl. May be a little erythema on the left TM  Urine dip  small to moderate  Leuks, blood trace  EKG HR 76  non specific T wave changes Reviewed with [name].( start Asprin and patient to follow up with her primary doctor.)    No cough noted in room Assessment & Plan  Sinusitis maxillalry , Eustachian tube dysfunction Dysuria, UTI  Non specific Chest pain Reviewed blood labs with patient all wnl. Will add Vit D level since patient is taking Vitamin D.  Drink plenty of fluids, Rest. Work note given for today and tomorrow. Continue Zyrtec and Nasal spray Astelin every day for allergies wile allergy season is in .  Follow up with your doctor as scheduled.  Reviewed with patient if chest pain returns to call  911 and go to the hospital where they can do further evaluation. She verbalizes understanding and has no questions at dishcarge.  Placed  on Asprin  / day. Meds ordered this encounter  Medications  . amoxicillin-clavulanate (AUGMENTIN) 875-125 MG tablet    Sig: Take 1 tablet by mouth 2 (two) times daily.    Dispense:  20 tablet    Refill:  0   If urinary symptoms do not improve in 3 days to return to the clinic for reevaluation. Patient also verbalizes understanding and has no  questions about her UTI or Sinus infection.

## 2017-05-12 NOTE — Patient Instructions (Addendum)
If chest pain returns to follow up with an Emergency Department, do not drive yourself. Call 911.  Otherwise follow up with your doctor as scheduled. Take  Asprin 81 mg daily .     Nonspecific Chest Pain Chest pain can be caused by many different conditions. There is a chance that your pain could be related to something serious, such as a heart attack or a blood clot in your lungs. Chest pain can also be caused by conditions that are not life-threatening. If you have chest pain, it is very important to follow up with your doctor. Follow these instructions at home: Medicines  If you were prescribed an antibiotic medicine, take it as told by your doctor. Do not stop taking the antibiotic even if you start to feel better.  Take over-the-counter and prescription medicines only as told by your doctor. Lifestyle  Do not use any products that contain nicotine or tobacco, such as cigarettes and e-cigarettes. If you need help quitting, ask your doctor.  Do not drink alcohol.  Make lifestyle changes as told by your doctor. These may include: ? Getting regular exercise. Ask your doctor for some activities that are safe for you. ? Eating a heart-healthy diet. A diet specialist (dietitian) can help you to learn healthy eating options. ? Staying at a healthy weight. ? Managing diabetes, if needed. ? Lowering your stress, as with deep breathing or spending time in nature. General instructions  Avoid any activities that make you feel chest pain.  If your chest pain is because of heartburn: ? Raise (elevate) the head of your bed about 6 inches (15 cm). You can do this by putting blocks under the bed legs at the head of the bed. ? Do not sleep with extra pillows under your head. That does not help heartburn.  Keep all follow-up visits as told by your doctor. This is important. This includes any further testing if your chest pain does not go away. Contact a doctor if:  Your chest pain does not go  away.  You have a rash with blisters on your chest.  You have a fever.  You have chills. Get help right away if:  Your chest pain is worse.  You have a cough that gets worse, or you cough up blood.  You have very bad (severe) pain in your belly (abdomen).  You are very weak.  You pass out (faint).  You have either of these for no clear reason: ? Sudden chest discomfort. ? Sudden discomfort in your arms, back, neck, or jaw.  You have shortness of breath at any time.  You suddenly start to sweat, or your skin gets clammy.  You feel sick to your stomach (nauseous).  You throw up (vomit).  You suddenly feel light-headed or dizzy.  Your heart starts to beat fast, or it feels like it is skipping beats. These symptoms may be an emergency. Do not wait to see if the symptoms will go away. Get medical help right away. Call your local emergency services (911 in the [place] Do not drive yourself to the hospital. This information is not intended to replace advice given to you by your health care provider. Make sure you discuss any questions you have with your health care provider. Document Released: 07/02/2007 Document Revised: 10/08/2015 Document Reviewed: 10/08/2015 Elsevier Interactive Patient Education  2017 [employer]. Sinusitis, Adult Sinusitis is soreness and inflammation of your sinuses. Sinuses are hollow spaces in the bones around your face. They are located:  Around your eyes.  In the middle of your forehead.  Behind your nose.  In your cheekbones.  Your sinuses and nasal passages are lined with a stringy fluid (mucus). Mucus normally drains out of your sinuses. When your nasal tissues get inflamed or swollen, the mucus can get trapped or blocked so air cannot flow through your sinuses. This lets bacteria, viruses, and funguses grow, and that leads to infection. Follow these instructions at home: Medicines  Take, use, or apply over-the-counter and prescription  medicines only as told by your doctor. These may include nasal sprays.  If you were prescribed an antibiotic medicine, take it as told by your doctor. Do not stop taking the antibiotic even if you start to feel better. Hydrate and Humidify  Drink enough water to keep your pee (urine) clear or pale yellow.  Use a cool mist humidifier to keep the humidity level in your home above 50%.  Breathe in steam for 10-15 minutes, 3-4 times a day or as told by your doctor. You can do this in the bathroom while a hot shower is running.  Try not to spend time in cool or dry air. Rest  Rest as much as possible.  Sleep with your head raised (elevated).  Make sure to get enough sleep each night. General instructions  Put a warm, moist washcloth on your face 3-4 times a day or as told by your doctor. This will help with discomfort.  Wash your hands often with soap and water. If there is no soap and water, use hand sanitizer.  Do not smoke. Avoid being around people who are smoking (secondhand smoke).  Keep all follow-up visits as told by your doctor. This is important. Contact a doctor if:  You have a fever.  Your symptoms get worse.  Your symptoms do not get better within 10 days. Get help right away if:  You have a very bad headache.  You cannot stop throwing up (vomiting).  You have pain or swelling around your face or eyes.  You have trouble seeing.  You feel confused.  Your neck is stiff.  You have trouble breathing. This information is not intended to replace advice given to you by your health care provider. Make sure you discuss any questions you have with your health care provider. Document Released: 07/02/2007 Document Revised: 09/09/2015 Document Reviewed: 11/08/2014 Elsevier Interactive Patient Education  2018 [employer].  Urinary Tract Infection, Adult A urinary tract infection (UTI) is an infection of any part of the urinary tract. The urinary tract includes  the:  Kidneys.  Ureters.  Bladder.  Urethra.  These organs make, store, and get rid of pee (urine) in the body. Follow these instructions at home:  Take over-the-counter and prescription medicines only as told by your doctor.  If you were prescribed an antibiotic medicine, take it as told by your doctor. Do not stop taking the antibiotic even if you start to feel better.  Avoid the following drinks: ? Alcohol. ? Caffeine. ? Tea. ? Carbonated drinks.  Drink enough fluid to keep your pee clear or pale yellow.  Keep all follow-up visits as told by your doctor. This is important.  Make sure to: ? Empty your bladder often and completely. Do not to hold pee for long periods of time. ? Empty your bladder before and after sex. ? Wipe from front to back after a bowel movement if you are female. Use each tissue one time when you wipe. Contact a doctor if:  You have back pain.  You have a fever.  You feel sick to your stomach (nauseous).  You throw up (vomit).  Your symptoms do not get better after 3 days.  Your symptoms go away and then come back. Get help right away if:  You have very bad back pain.  You have very bad lower belly (abdominal) pain.  You are throwing up and cannot keep down any medicines or water. This information is not intended to replace advice given to you by your health care provider. Make sure you discuss any questions you have with your health care provider. Document Released: 07/02/2007 Document Revised: 06/21/2015 Document Reviewed: 12/04/2014 Elsevier Interactive Patient Education  2018 Elsevier Inc.  Eustachian Tube Dysfunction The eustachian tube connects the middle ear to the back of the nose. It regulates air pressure in the middle ear by allowing air to move between the ear and nose. It also helps to drain fluid from the middle ear space. When the eustachian tube does not function properly, air pressure, fluid, or both can build up in the  middle ear. Eustachian tube dysfunction can affect one or both ears. What are the causes? This condition happens when the eustachian tube becomes blocked or cannot open normally. This may result from:  Ear infections.  Colds and other upper respiratory infections.  Allergies.  Irritation, such as from cigarette smoke or acid from the stomach coming up into the esophagus (gastroesophageal reflux).  Sudden changes in air pressure, such as from descending in an airplane.  Abnormal growths in the nose or throat, such as nasal polyps, tumors, or enlarged tissue at the back of the throat (adenoids).  What increases the risk? This condition may be more likely to develop in people who smoke and people who are overweight. Eustachian tube dysfunction may also be more likely to develop in children, especially children who have:  Certain birth defects of the mouth, such as cleft palate.  Large tonsils and adenoids.  What are the signs or symptoms? Symptoms of this condition may include:  A feeling of fullness in the ear.  Ear pain.  Clicking or popping noises in the ear.  Ringing in the ear.  Hearing loss.  Loss of balance.  Symptoms may get worse when the air pressure around you changes, such as when you travel to an area of high elevation or fly on an airplane. How is this diagnosed? This condition may be diagnosed based on:  Your symptoms.  A physical exam of your ear, nose, and throat.  Tests, such as those that measure: ? The movement of your eardrum (tympanogram). ? Your hearing (audiometry).  How is this treated? Treatment depends on the cause and severity of your condition. If your symptoms are mild, you may be able to relieve your symptoms by moving air into ("popping") your ears. If you have symptoms of fluid in your ears, treatment may include:  Decongestants.  Antihistamines.  Nasal sprays or ear drops that contain medicines that reduce swelling  (steroids).  In some cases, you may need to have a procedure to drain the fluid in your eardrum (myringotomy). In this procedure, a small tube is placed in the eardrum to:  Drain the fluid.  Restore the air in the middle ear space.  Follow these instructions at home:  Take over-the-counter and prescription medicines only as told by your health care provider.  Use techniques to help pop your ears as recommended by your health care provider. These may include: ?  Chewing gum. ? Yawning. ? Frequent, forceful swallowing. ? Closing your mouth, holding your nose closed, and gently blowing as if you are trying to blow air out of your nose.  Do not do any of the following until your health care provider approves: ? Travel to high altitudes. ? Fly in airplanes. ? Work in a Estate agent or room. ? Scuba dive.  Keep your ears dry. Dry your ears completely after showering or bathing.  Do not smoke.  Keep all follow-up visits as told by your health care provider. This is important. Contact a health care provider if:  Your symptoms do not go away after treatment.  Your symptoms come back after treatment.  You are unable to pop your ears.  You have: ? A fever. ? Pain in your ear. ? Pain in your head or neck. ? Fluid draining from your ear.  Your hearing suddenly changes.  You become very dizzy.  You lose your balance. This information is not intended to replace advice given to you by your health care provider. Make sure you discuss any questions you have with your health care provider. Document Released: 02/09/2015 Document Revised: 06/21/2015 Document Reviewed: 02/01/2014 Elsevier Interactive Patient Education  [employer].

## 2017-05-14 LAB — VITAMIN D 25 HYDROXY (VIT D DEFICIENCY, FRACTURES): Vit D, 25-Hydroxy: 46.6 ng/mL (ref 30.0–100.0)

## 2017-05-14 LAB — SPECIMEN STATUS REPORT

## 2017-05-26 ENCOUNTER — Ambulatory Visit: Payer: BLUE CROSS/BLUE SHIELD | Admitting: Medical

## 2017-05-26 ENCOUNTER — Encounter: Payer: Self-pay | Admitting: Medical

## 2017-05-26 VITALS — BP 138/79 | HR 95 | Resp 16 | Wt 124.0 lb

## 2017-05-26 DIAGNOSIS — R748 Abnormal levels of other serum enzymes: Secondary | ICD-10-CM

## 2017-05-26 DIAGNOSIS — R110 Nausea: Secondary | ICD-10-CM

## 2017-05-26 DIAGNOSIS — R112 Nausea with vomiting, unspecified: Secondary | ICD-10-CM

## 2017-05-26 LAB — POCT URINALYSIS DIPSTICK
Bilirubin, UA: NEGATIVE
Blood, UA: NEGATIVE
Glucose, UA: NEGATIVE
Ketones, UA: NEGATIVE
LEUKOCYTES UA: NEGATIVE
NITRITE UA: NEGATIVE
PH UA: 6.5 (ref 5.0–8.0)
PROTEIN UA: NEGATIVE
Spec Grav, UA: 1.02 (ref 1.010–1.025)
UROBILINOGEN UA: 0.2 U/dL

## 2017-05-26 MED ORDER — ONDANSETRON 4 MG PO TBDP
4.0000 mg | ORAL_TABLET | Freq: Once | ORAL | Status: AC
  Administered 2017-05-26: 4 mg via ORAL

## 2017-05-26 MED ORDER — ONDANSETRON 4 MG PO TBDP: 4.0000 mg | ORAL_TABLET | Freq: Three times a day (TID) | ORAL | 0 refills | Status: DC | PRN

## 2017-05-26 NOTE — Progress Notes (Addendum)
Subjective:    Patient ID: Susan Allison, female    DOB: 1961/01/15, 57 y.o.   MRN: [id number]  HPI  57 yo female in non acute distress. Nausea on Sunday and Monday, ate lightly, after work ate a regular meal and then vomited one time. Mild nausea now. Itching notice on Sunday. Takes Zyrtec daily.  Itching all over now. Ate grits/ tosast and orange juice for breakfast.Takes nothing for her reflux. Only gets it some of the time and feels she does not need any medication daily.  Sinus infection feels better.Denies shortness of breath and trouble swallowing. Patient works first shift.  Sees Ce[facility] Vaught  ENT last seen 8-9 months ago for diagnosied with Eustachain tube dysfunction.  Appointment on June  14  With Alma Friendly FNP .  Blood pressure 138/79, pulse 95, resp. rate 16, weight 124 lb (56.2 kg), SpO2 100 %.  Review of Systems  Constitutional: Positive for appetite change (decreased ). Negative for chills and fever.  HENT: Negative for congestion, ear pain and sore throat.   Eyes: Negative for discharge, itching and visual disturbance.  Respiratory: Positive for cough and shortness of breath.   Cardiovascular: Positive for chest pain (periodically and ache on the left side.).  Gastrointestinal: Positive for abdominal pain (cramping), nausea and vomiting. Negative for abdominal distention, anal bleeding, blood in stool, constipation and diarrhea.  Endocrine: Positive for polydipsia (has cut back fluids due to nausea). Negative for polyphagia and polyuria.  Genitourinary: Negative for dysuria.  Musculoskeletal: Negative for myalgias.  Skin: Negative for rash.  Allergic/Immunologic: Positive for environmental allergies (seasonal allergies).  Neurological: Positive for light-headedness. Negative for dizziness.  Hematological: Negative for adenopathy.  Psychiatric/Behavioral: Negative for behavioral problems, self-injury and suicidal ideas.   Denies blood in vomit, may have  been a little green she is not sure. Denies any rash or hives, or trouble swallowing or breathing. Mother with heart disease.mother with history of non alcoholic cirrohsis.  She is decased now  At the ago  57 yo.  No tick removal this year  Improvement of chest pain since treatment with antibiotic and ASA 81 mg started.    Objective:   Physical Exam  Constitutional: She is oriented to person, place, and time. She appears well-developed and well-nourished.  HENT:  Head: Normocephalic and atraumatic.  Eyes: Pupils are equal, round, and reactive to light. Conjunctivae and EOM are normal.  Neck: Normal range of motion. Neck supple.  Cardiovascular: Normal rate, regular rhythm and normal heart sounds.  Pulmonary/Chest: Effort normal and breath sounds normal.  Abdominal: Soft. Bowel sounds are normal. She exhibits no distension and no mass. There is no tenderness. There is no rebound and no guarding. No hernia.  Lymphadenopathy:    She has no cervical adenopathy.  Neurological: She is alert and oriented to person, place, and time.  Skin: Skin is warm and dry.  Psychiatric: She has a normal mood and affect. Her behavior is normal. Judgment and thought content normal.  Nursing note and vitals reviewed.   Urine dip wnl  Polyp possible right side of nose vs swollen pale turbinate..     patient not itching in room Easily gets off the exam table , gait steady and wnl.  Assessment & Plan:  Nausea and vomiting non intractable CBC and Met C and Urine dip ordered in Epic.will call patient once received. Given Zofran 4 mg ODT for nausea  25 min later patient felt better. Meds ordered this encounter  Medications  .  ondansetron (ZOFRAN ODT) 4 MG disintegrating tablet    Sig: Take 1 tablet (4 mg total) by mouth every 8 (eight) hours as needed for nausea or vomiting.    Dispense:  6 tablet    Refill:  0  Take OTC Zantac 75 mg to prevent any heartburn. Gatorade/Giinger Ale/ Clear liquid diet if  tolerating can move to a bland diet. Return back to clinic as needed.  Patient verbalizes understanding and has no questions at discharge.  Reviwed labs with Dr. Rosanna Randy Stat Limited RUQ) patient ate breakfast so needs do be 6 hours since last eaten).  Alk phos 322 AST 312 ALT 703. Scheduled for ultrasound limited abdomen 2:15 pm today at [facility]. Patient called and made aware of location and schedule. Lipase and Amylase ordered STAT.

## 2017-05-26 NOTE — Patient Instructions (Signed)
Gastritis, Adult Gastritis is swelling (inflammation) of the stomach. When you have this condition, you can have these problems (symptoms):  Pain in your stomach.  A burning feeling in your stomach.  Feeling sick to your stomach (nauseous).  Throwing up (vomiting).  Feeling too full after you eat.  It is important to get help for this condition. Without help, your stomach can bleed, and you can get sores (ulcers) in your stomach. Follow these instructions at home:  Take over-the-counter and prescription medicines only as told by your doctor.  If you were prescribed an antibiotic medicine, take it as told by your doctor. Do not stop taking it even if you start to feel better.  Drink enough fluid to keep your pee (urine) clear or pale yellow.  Instead of eating big meals, eat small meals often. Contact a health care provider if:  Your problems get worse.  Your problems go away and then come back. Get help right away if:  You throw up blood or something that looks like coffee grounds.  You have black or dark red poop (stools).  You cannot keep fluids down.  Your stomach pain gets worse.  You have a fever.  You do not feel better after 1 week. This information is not intended to replace advice given to you by your health care provider. Make sure you discuss any questions you have with your health care provider. Document Released: 07/02/2007 Document Revised: 09/12/2015 Document Reviewed: 10/07/2014 Elsevier Interactive Patient Education  2018 [employer] Inc. Clear Liquid Diet A clear liquid diet means that you only have liquids that you can see through. You do not eat any food on this diet. Most people need to follow this diet for only a short time. What do I need to know about this diet?  A clear liquid is a liquid that you can see through when you hold it up to a light.  This diet does not give you all the nutrients that you need. Choose a variety of the liquids that  your doctor says you can drink on this diet. That way, you will get as many nutrients as possible.  If you are not sure whether you can have certain items, ask your doctor. What can I have?  Water and flavored water.  Fruit juices that do not have pulp, such as cranberry juice and apple juice.  Tea and coffee without milk or cream.  Clear bouillon or broth.  Broth-based soups that have been strained.  Flavored gelatins.  Honey.  Sugar water.  Frozen ice or frozen ice pops that do not have any milk, yogurt, fruit pieces, or fruit pulp in them.  Clear sodas.  Clear sports drinks. The items listed above may not be a complete list of recommended liquids. Contact your food and nutrition expert (dietitian) for more options. What can I not have?  Juices that have pulp.  Milk.  Cream or cream-based soups.  Yogurt. The items listed above may not be a complete list of liquids to avoid. Contact your food and nutrition expert for more information. Summary  A clear liquid diet is a diet that includes only liquids that you can see through.  The goal of this diet is to help you recover.  Make sure to avoid liquids with milk, cream, or pulp while you are on this diet. This information is not intended to replace advice given to you by your health care provider. Make sure you discuss any questions you have with your  health care provider. Document Released: 12/27/2007 Document Revised: 08/27/2015 Document Reviewed: 12/10/2012 Elsevier Interactive Patient Education  2017 [employer].

## 2017-05-27 ENCOUNTER — Telehealth: Payer: Self-pay | Admitting: Adult Health

## 2017-05-27 ENCOUNTER — Other Ambulatory Visit: Payer: Self-pay | Admitting: Medical

## 2017-05-27 ENCOUNTER — Telehealth: Payer: Self-pay | Admitting: *Deleted

## 2017-05-27 ENCOUNTER — Ambulatory Visit
Admission: RE | Admit: 2017-05-27 | Discharge: 2017-05-27 | Disposition: A | Discharge status: Home / Self Care | Payer: BLUE CROSS/BLUE SHIELD | Source: Ambulatory Visit | Attending: Medical | Admitting: Medical

## 2017-05-27 DIAGNOSIS — R112 Nausea with vomiting, unspecified: Secondary | ICD-10-CM

## 2017-05-27 DIAGNOSIS — R748 Abnormal levels of other serum enzymes: Secondary | ICD-10-CM | POA: Diagnosis not present

## 2017-05-27 DIAGNOSIS — K8020 Calculus of gallbladder without cholecystitis without obstruction: Secondary | ICD-10-CM

## 2017-05-27 DIAGNOSIS — R110 Nausea: Secondary | ICD-10-CM

## 2017-05-27 LAB — CBC WITH DIFFERENTIAL/PLATELET
Basophils Absolute: 0 10*3/uL (ref 0.0–0.2)
Basos: 1 %
EOS (ABSOLUTE): 0.1 10*3/uL (ref 0.0–0.4)
EOS: 3 %
HEMATOCRIT: 40.2 % (ref 34.0–46.6)
Hemoglobin: 13.3 g/dL (ref 11.1–15.9)
IMMATURE GRANULOCYTES: 0 %
Immature Grans (Abs): 0 10*3/uL (ref 0.0–0.1)
Lymphocytes Absolute: 1.2 10*3/uL (ref 0.7–3.1)
Lymphs: 35 %
MCH: 28.5 pg (ref 26.6–33.0)
MCHC: 33.1 g/dL (ref 31.5–35.7)
MCV: 86 fL (ref 79–97)
MONOS ABS: 0.4 10*3/uL (ref 0.1–0.9)
Monocytes: 11 %
NEUTROS PCT: 50 %
Neutrophils Absolute: 1.7 10*3/uL (ref 1.4–7.0)
Platelets: 266 10*3/uL (ref 150–379)
RBC: 4.67 x10E6/uL (ref 3.77–5.28)
RDW: 15.7 % — AB (ref 12.3–15.4)
WBC: 3.5 10*3/uL (ref 3.4–10.8)

## 2017-05-27 LAB — COMPREHENSIVE METABOLIC PANEL
ALT: 703 IU/L — AB (ref 0–32)
AST: 312 IU/L — AB (ref 0–40)
Albumin/Globulin Ratio: 1.7 (ref 1.2–2.2)
Albumin: 4.8 g/dL (ref 3.5–5.5)
Alkaline Phosphatase: 322 IU/L — ABNORMAL HIGH (ref 39–117)
BUN/Creatinine Ratio: 7 — ABNORMAL LOW (ref 9–23)
BUN: 8 mg/dL (ref 6–24)
Bilirubin Total: 1.2 mg/dL (ref 0.0–1.2)
CALCIUM: 9.9 mg/dL (ref 8.7–10.2)
CO2: 25 mmol/L (ref 20–29)
CREATININE: 1.07 mg/dL — AB (ref 0.57–1.00)
Chloride: 102 mmol/L (ref 96–106)
GFR calc Af Amer: 66 mL/min/{1.73_m2} (ref >59)
GFR, EST NON AFRICAN AMERICAN: 57 mL/min/{1.73_m2} — AB (ref >59)
GLOBULIN, TOTAL: 2.9 g/dL (ref 1.5–4.5)
GLUCOSE: 99 mg/dL (ref 65–99)
Potassium: 4.3 mmol/L (ref 3.5–5.2)
SODIUM: 145 mmol/L — AB (ref 134–144)
Total Protein: 7.7 g/dL (ref 6.0–8.5)

## 2017-05-27 LAB — SPECIMEN STATUS REPORT

## 2017-05-27 LAB — AMYLASE: Amylase: 85 U/L (ref 31–124)

## 2017-05-27 LAB — LIPASE: Lipase: 97 U/L — ABNORMAL HIGH (ref 14–72)

## 2017-05-27 NOTE — Telephone Encounter (Signed)
   Patient called with results of ultrasound results of RUQ limited abdominal showing as below EXAM: ULTRASOUND ABDOMEN LIMITED RIGHT UPPER QUADRANT COMPARISON:  None FINDINGS: Gallbladder:Dependent calculi in gallbladder up to 11 mm diameter. No gallbladder wall thickening, pericholecystic fluid or sonographic Murphy sign. Common bile duct: Diameter: 3 mm diameter, normal Liver:Upper normal echogenicity. No attic mass or nodularity. Portal vein is patent on color Doppler imaging with normal direction of blood flow towards the liver.  No RIGHT upper quadrant free fluid.  IMPRESSION: Cholelithiasis without evidence acute cholecystitis. - no obstruction  she reports she has no more vomiting since seen in office and denies any abdominal pain resolved so far with diet changes and decreased eating the past day - she was advised to do clear liquid diet and could advance to band diet as tolerated. She denied any other new symptoms since office visit with Ellie Lunch PA-C on 05/15/17.  Reviewed labs as well- elevated liver enzymes avoidance of alcohol, tylenol containing products as well.  Kidney function mildly decreased - avoiding use of NSAID's if not needed for pain. Discussed hydration.  Discussed referral to surgeon for evaluation and need for repeat labs on 06/02/17. She is advised to call office if she has not heard from referral by Friday this week. She will call the office if symptoms change or worsen at anytime. She will seek after hours e emergency room if office is closed and emergency symptoms arise or symptoms worsen.  Advised 911 for emergencies. She will call back to schedule lab appointment when office reopens 05/28/17. Urgent referral placed and Armando Gang RN aware and send over referral.   Orders Placed This Encounter  Procedures  . Comprehensive metabolic panel    Standing Status:   Future    Standing Expiration Date:   05/28/2018  . CBC w/Diff    Standing Status:    Future    Standing Expiration Date:   06/27/2017  . Lipase    Standing Status:   Future    Standing Expiration Date:   05/28/2018  . Amylase    Standing Status:   Future    Standing Expiration Date:   05/28/2018  . Ambulatory referral to General Surgery    Referral Priority:   Urgent    Referral Type:   Surgical    Referral Reason:   Specialty Services Required    Referred to Provider:   Earline Mayotte, MD    Requested Specialty:   General Surgery    Number of Visits Requested:   1

## 2017-05-27 NOTE — Addendum Note (Signed)
Addended by: Sharin Grave on: 05/27/2017 08:53 AM   Modules accepted: Orders

## 2017-05-27 NOTE — Addendum Note (Signed)
Addended by: RATCLIFFE, Herbert Seta R on: 05/27/2017 11:12 AM   Modules accepted: Orders

## 2017-05-28 ENCOUNTER — Encounter: Payer: Self-pay | Admitting: *Deleted

## 2017-05-29 ENCOUNTER — Telehealth: Payer: Self-pay

## 2017-05-29 NOTE — Telephone Encounter (Signed)
Contacted patient to follow up with urgent referral made to [facility].  Patient was contacted by [facility] and appointment was made for May 7th with Dr.  Lemar Livings.

## 2017-06-01 NOTE — Progress Notes (Signed)
05/26/2017 urine results were negative

## 2017-06-02 ENCOUNTER — Encounter: Payer: Self-pay | Admitting: General Surgery

## 2017-06-02 ENCOUNTER — Ambulatory Visit: Payer: BLUE CROSS/BLUE SHIELD | Admitting: General Surgery

## 2017-06-02 VITALS — BP 122/82 | HR 79 | Resp 12 | Ht 59.0 in | Wt 124.0 lb

## 2017-06-02 DIAGNOSIS — R945 Abnormal results of liver function studies: Secondary | ICD-10-CM | POA: Diagnosis not present

## 2017-06-02 DIAGNOSIS — K8020 Calculus of gallbladder without cholecystitis without obstruction: Secondary | ICD-10-CM | POA: Diagnosis not present

## 2017-06-02 DIAGNOSIS — R7989 Other specified abnormal findings of blood chemistry: Secondary | ICD-10-CM | POA: Insufficient documentation

## 2017-06-02 NOTE — Patient Instructions (Signed)
Follow up appointment to be announced.  

## 2017-06-02 NOTE — Progress Notes (Signed)
Patient ID: Susan Allison, female   DOB: 1960/03/06, 57 y.o.   MRN: [id number]  Chief Complaint  Patient presents with  . Other    HPI Susan Allison is a 57 y.o. female.  Here for evaluation of gall stones referred by Marvell Fuller NP. She states she hasn't been feeling well with epigastric pain. She states she had an ultrasound that showed gall stones. She states she has had the pain in the past lasting about an hour. She did have nausea and vomited couple a weeks ago as well. Bowels are regular. Not associated with an particular foods.She has been having blood pressure problems for six week.  Marland Kitchen  HPI   Past Medical History:  Diagnosis Date  . Ear infection   . GERD (gastroesophageal reflux disease)   . Joint pain   . Mastodynia   . Sinus problem     Past Surgical History:  Procedure Laterality Date  . COLONOSCOPY  2011  . ESOPHAGOGASTRODUODENOSCOPY N/A 08/14/2014   Procedure: ESOPHAGOGASTRODUODENOSCOPY (EGD);  Surgeon: [name], MD;  Location: [facility] ENDOSCOPY;  Service: Gastroenterology;  Laterality: N/A;    Family History  Problem Relation Age of Onset  . Breast cancer Other        40's  . Hypertension Mother   . Liver disease Mother   . Prostate cancer Father     Social History Social History   Tobacco Use  . Smoking status: Never Smoker  . Smokeless tobacco: Never Used  Substance Use Topics  . Alcohol use: No  . Drug use: No    No Known Allergies  Current Outpatient Medications  Medication Sig Dispense Refill  . aspirin EC 81 MG tablet Take 81 mg by mouth daily.    Marland Kitchen azelastine (ASTELIN) 0.1 % nasal spray Place 2 sprays into both nostrils 2 (two) times daily. Use in each nostril as directed    . calcium carbonate (OS-CAL) 600 MG TABS tablet Take 600 mg by mouth 2 (two) times daily with a meal.    . cetirizine (ZYRTEC) 10 MG tablet Take 10 mg by mouth daily.    Marland Kitchen lisinopril-hydrochlorothiazide (PRINZIDE,ZESTORETIC) 10-12.5 MG tablet Take 1 tablet by  mouth daily. 15 tablet 0  . ondansetron (ZOFRAN ODT) 4 MG disintegrating tablet Take 1 tablet (4 mg total) by mouth every 8 (eight) hours as needed for nausea or vomiting. 6 tablet 0  . Vitamin D, Ergocalciferol, (DRISDOL) 50000 units CAPS capsule Take 50,000 Units by mouth every 7 (seven) days.     No current facility-administered medications for this visit.     Review of Systems Review of Systems  Constitutional: Negative.   Respiratory: Negative.   Cardiovascular: Negative.   Gastrointestinal: Positive for abdominal pain, nausea and vomiting. Negative for constipation and diarrhea.    Blood pressure 122/82, pulse 79, resp. rate 12, height  (1.499 m), weight 124 lb (56.2 kg), SpO2 99 %.  Physical Exam Physical Exam  Constitutional: She is oriented to person, place, and time. She appears well-developed and well-nourished.  Eyes: Conjunctivae are normal.  No scleral icterus.  Neck: Normal range of motion.  Cardiovascular: Normal rate, regular rhythm and normal heart sounds.  Pulmonary/Chest: Effort normal and breath sounds normal.  Abdominal: Soft. Bowel sounds are normal. There is no tenderness.  Lymphadenopathy:    She has no cervical adenopathy.  Neurological: She is alert and oriented to person, place, and time.  Skin: Skin is warm and dry.  The patient is very  dark skin, although I see no evidence of jaundice.    Data Reviewed  From [employer] re: lisinopril. Dermatologic Uncommon (0.1% to 1%): Rash, pruritus, erythema Hepatic Common (1% to 10%): Creatinine increased Very rare (less than 0.01%): Cholestatic jaundice that progresses to fulminant hepatic necrosis and sometimes death (discontinue of therapy if jaundice or markedly elevated hepatic serum enzymes develop)  Laboratory studies dated May 26, 2017 were reviewed.   Lipase 97 (upper limit of normal 72). Comprehensive metabolic panel notable for alkaline phosphatase of 322, AST: 312, ALT: 703.  Total  bilirubin 1.2. CBC of the same date showed a hemoglobin of 13.3, MCV of 86, white blood cell count 3500 with normal differential.  Platelet count of 266,000.  Laboratory studies of May 08, 2017: Comprehensive metabolic panel entirely normal.  Bilirubin less than 0.2.  Alkaline phosphatase 86, AST: 17; ALT: 11.  Abdominal ultrasound of May 27, 2017: FINDINGS: Gallbladder:  Dependent calculi in gallbladder up to 11 mm diameter. No gallbladder wall thickening, pericholecystic fluid or sonographic Murphy sign.  Common bile duct:  Diameter: 3 mm diameter, normal  Liver:  Upper normal echogenicity. No attic mass or nodularity. Portal vein is patent on color Doppler imaging with normal direction of blood flow towards the liver.  No RIGHT upper quadrant free fluid.  IMPRESSION: Cholelithiasis without evidence acute cholecystitis.  Assessment    Sudden jump and serum transaminases and alkaline phosphatase temporally related to a 24-hour history of illness with associated with nausea and vomiting.  6 weeks post initiation of lisinopril for hypertension.  Pruritus since initiation of lisinopril therapy.  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    Plan    Patient to get her function studies repeated as part today.    If her liver functions have returned to normal, it is most likely that she passed a common bile duct stone based on the tiny rise in the serum lipase and the marked elevation in the transaminases.  If they remain elevated we will need to look for other occult causes including possible lisinopril reaction or hepatitis.   If her LFTs have returned to normal, I would encourage her to have a cholecystectomy with the idea that this recent episode could have been a passed duct stone even with a normal common bile duct on ultrasound 1 week after symptoms had resolved.  Patient will present to the [facility] today for repeat LFTs. HPI, Physical Exam, Assessment and Plan have been scribed under the direction and in the presence of Earline Mayotte, MD. Dorathy Daft, RN   I have completed the exam and reviewed the above documentation for accuracy and completeness.  I agree with the above.  [employer] has been used and any errors in dictation or transcription are unintentional.  Donnalee Curry, M.D., F.A.C.S. .  Merrily Pew Yandiel Bergum 06/02/2017, 5:30 PM

## 2017-06-03 ENCOUNTER — Other Ambulatory Visit: Payer: BLUE CROSS/BLUE SHIELD | Admitting: Medical

## 2017-06-03 ENCOUNTER — Telehealth: Payer: Self-pay | Admitting: General Surgery

## 2017-06-03 ENCOUNTER — Telehealth: Payer: Self-pay | Admitting: Adult Health

## 2017-06-03 DIAGNOSIS — R748 Abnormal levels of other serum enzymes: Secondary | ICD-10-CM

## 2017-06-03 LAB — HEPATIC FUNCTION PANEL
ALT: 505 IU/L (ref 0–32)
AST: 215 IU/L — ABNORMAL HIGH (ref 0–40)
Albumin: 4.5 g/dL (ref 3.5–5.5)
Alkaline Phosphatase: 243 IU/L — ABNORMAL HIGH (ref 39–117)
Bilirubin Total: 1.9 mg/dL — ABNORMAL HIGH (ref 0.0–1.2)
Bilirubin, Direct: 1.4 mg/dL — ABNORMAL HIGH (ref 0.00–0.40)
TOTAL PROTEIN: 7.5 g/dL (ref 6.0–8.5)

## 2017-06-03 NOTE — Telephone Encounter (Signed)
Function results reviewed.  Improvement in the serum transaminases and alkaline phosphatase, increase in the bilirubin to 1.9 with a direct component of 1.4. Cholestatic jaundice is not uncommon problem associated with lisinopril, and the present laboratory studies speak more to a direct hyperbilirubinemia.  With a normal common bile duct, no abdominal symptoms after the 1 day of nausea and vomiting 8-10 days ago and yesterday's benign exam, I suspect that this is more likely drug related than related to hepatitis or common bile duct stones.  I have spoken with Marvell Fuller, FNP, at the [facility] clinic.  We will make arrangements for the patient to have an acute hepatitis panel now, and repeat liver function studies next week.  The patient has been asked to discontinue her lisinopril.  If she has worsening abdominal symptoms, will reassess for possible choledocholithiasis, although I do not think that the issue at this time.  Follow-up will be based on next week liver function studies.

## 2017-06-03 NOTE — Addendum Note (Signed)
Addended by: Berniece Pap on: 06/03/2017 02:52 PM   Modules accepted: Orders

## 2017-06-03 NOTE — Telephone Encounter (Addendum)
Provider spoke with Dr. Lemar Livings by phone and see below note from surgeon. He consulted with gastrointestinal MD and suspects suspect that this is more likely drug related than related to hepatitis or common bile duct stones as patient had benign exam in his office on 06/02/17 and complained of worsening itching.  Patient is not a primary care patinet here at [facility], she has primary care appointment scheduled 06/09/17 with Doreene Nest, NP.  She will come to this office today 06/03/17 for Acute Hepatitis panel today at 2:30pm for lab and will at that time schedule a repeat lab appointment for recheck of Hepatic function panel.  Patient will discontinue blood pressure medication Lisinopril and is advised to record her blood pressure readings at home and follow anti-hypertensive diet at home. Call if blood pressure outside of parameters or over 140/90 given.  She denies any other symptoms besides itching at time of call. She denies any signs of angioedema.Will send hepatic panel to Dr. Lemar Livings when resulted.  Patient verbalized understanding of all instructions given and denies any further questions at this time.    Medications Discontinued During This Encounter  Medication Reason  . lisinopril-hydrochlorothiazide (PRINZIDE,ZESTORETIC) 10-12.5 MG tablet Side effect (s)   Orders Placed This Encounter  Procedures  . Hepatitis panel, acute  . Hepatic function panel    Standing Status:   Future    Standing Expiration Date:   06/04/2018   I have already ordered this as a future order in the computer.   - Message from Earline Mayotte, MD sent at 06/03/2017 12:07 PM EDT ----- Discussed with PCP. They will obtain acute hepatitis panel, d/c lisinopril/ HCT and arrange for a repeat hepatic function panel in one week. Function results reviewed.  Improvement in the serum transaminases and alkaline phosphatase, increase in the bilirubin to 1.9 with a direct component of 1.4. Cholestatic jaundice is not uncommon  problem associated with lisinopril, and the present laboratory studies speak more to a direct hyperbilirubinemia.  With a normal common bile duct, no abdominal symptoms after the 1 day of nausea and vomiting 8-10 days ago and yesterday's benign exam, I suspect that this is more likely drug related than related to hepatitis or common bile duct stones.  I have spoken with Marvell Fuller, FNP, at the [facility] clinic.  We will make arrangements for the patient to have an acute hepatitis panel now, and repeat liver function studies next week.  The patient has been asked to discontinue her lisinopril.  If she has worsening abdominal symptoms, will reassess for possible choledocholithiasis, although I do not think that the issue at this time.  Follow-up will be based on next week liver function studies.

## 2017-06-04 LAB — HEPATITIS PANEL, ACUTE
HEP B S AG: NEGATIVE
Hep A IgM: NEGATIVE
Hep B C IgM: NEGATIVE
Hep C Virus Ab: 0.2 s/co ratio (ref 0.0–0.9)

## 2017-06-09 ENCOUNTER — Ambulatory Visit: Payer: BLUE CROSS/BLUE SHIELD | Admitting: Primary Care

## 2017-06-09 ENCOUNTER — Encounter: Payer: Self-pay | Admitting: Primary Care

## 2017-06-09 ENCOUNTER — Ambulatory Visit: Payer: BLUE CROSS/BLUE SHIELD | Admitting: General Surgery

## 2017-06-09 VITALS — BP 130/80 | HR 78 | Temp 98.1°F | Ht 59.0 in | Wt 126.2 lb

## 2017-06-09 DIAGNOSIS — R7989 Other specified abnormal findings of blood chemistry: Secondary | ICD-10-CM

## 2017-06-09 DIAGNOSIS — R945 Abnormal results of liver function studies: Secondary | ICD-10-CM | POA: Diagnosis not present

## 2017-06-09 DIAGNOSIS — L299 Pruritus, unspecified: Secondary | ICD-10-CM | POA: Diagnosis not present

## 2017-06-09 DIAGNOSIS — K8020 Calculus of gallbladder without cholecystitis without obstruction: Secondary | ICD-10-CM

## 2017-06-09 DIAGNOSIS — R079 Chest pain, unspecified: Secondary | ICD-10-CM | POA: Diagnosis not present

## 2017-06-09 DIAGNOSIS — I10 Essential (primary) hypertension: Secondary | ICD-10-CM

## 2017-06-09 MED ORDER — FAMOTIDINE 20 MG PO TABS: 20.0000 mg | ORAL_TABLET | Freq: Two times a day (BID) | ORAL | 0 refills | Status: DC

## 2017-06-09 NOTE — Patient Instructions (Signed)
Continue Zyrtec daily.   Start famotidine (Pepcid) 20 mg once to twice daily for itching.  Continue to monitor your blood pressure as discussed and report readings at or above 140/90.  Recheck your labs as scheduled tomorrow, please have them send me the results.  Please schedule a follow up appointment in 3 months for blood pressure check  It was a pleasure to meet you today! Please don't hesitate to call or message me with any questions. Welcome to [facility]!   DASH Eating Plan DASH stands for "Dietary Approaches to Stop Hypertension." The DASH eating plan is a healthy eating plan that has been shown to reduce high blood pressure (hypertension). It may also reduce your risk for type 2 diabetes, heart disease, and stroke. The DASH eating plan may also help with weight loss. What are tips for following this plan? General guidelines  Avoid eating more than 2,300 mg (milligrams) of salt (sodium) a day. If you have hypertension, you may need to reduce your sodium intake to 1,500 mg a day.  Limit alcohol intake to no more than 1 drink a day for nonpregnant women and 2 drinks a day for men. One drink equals 12 oz of beer, 5 oz of wine, or 1 oz of hard liquor.  Work with your health care provider to maintain a healthy body weight or to lose weight. Ask what an ideal weight is for you.  Get at least 30 minutes of exercise that causes your heart to beat faster (aerobic exercise) most days of the week. Activities may include walking, swimming, or biking.  Work with your health care provider or diet and nutrition specialist (dietitian) to adjust your eating plan to your individual calorie needs. Reading food labels  Check food labels for the amount of sodium per serving. Choose foods with less than 5 percent of the Daily Value of sodium. Generally, foods with less than 300 mg of sodium per serving fit into this eating plan.  To find whole grains, look for the word "whole" as the first word in  the ingredient list. Shopping  Buy products labeled as "low-sodium" or "no salt added."  Buy fresh foods. Avoid canned foods and premade or frozen meals. Cooking  Avoid adding salt when cooking. Use salt-free seasonings or herbs instead of table salt or sea salt. Check with your health care provider or pharmacist before using salt substitutes.  Do not fry foods. Cook foods using healthy methods such as baking, boiling, grilling, and broiling instead.  Cook with heart-healthy oils, such as olive, canola, soybean, or sunflower oil. Meal planning   Eat a balanced diet that includes: ? 5 or more servings of fruits and vegetables each day. At each meal, try to fill half of your plate with fruits and vegetables. ? Up to 6-8 servings of whole grains each day. ? Less than 6 oz of lean meat, poultry, or fish each day. A 3-oz serving of meat is about the same size as a deck of cards. One egg equals 1 oz. ? 2 servings of low-fat dairy each day. ? A serving of nuts, seeds, or beans 5 times each week. ? Heart-healthy fats. Healthy fats called Omega-3 fatty acids are found in foods such as flaxseeds and coldwater fish, like sardines, salmon, and mackerel.  Limit how much you eat of the following: ? Canned or prepackaged foods. ? Food that is high in trans fat, such as fried foods. ? Food that is high in saturated fat, such as fatty  meat. ? Sweets, desserts, sugary drinks, and other foods with added sugar. ? Full-fat dairy products.  Do not salt foods before eating.  Try to eat at least 2 vegetarian meals each week.  Eat more home-cooked food and less restaurant, buffet, and fast food.  When eating at a restaurant, ask that your food be prepared with less salt or no salt, if possible. What foods are recommended? The items listed may not be a complete list. Talk with your dietitian about what dietary choices are best for you. Grains Whole-grain or whole-wheat bread. Whole-grain or  whole-wheat pasta. Brown rice. [place]. Bulgur. Whole-grain and low-sodium cereals. Pita bread. Low-fat, low-sodium crackers. Whole-wheat flour tortillas. Vegetables Fresh or frozen vegetables (raw, steamed, roasted, or grilled). Low-sodium or reduced-sodium tomato and vegetable juice. Low-sodium or reduced-sodium tomato sauce and tomato paste. Low-sodium or reduced-sodium canned vegetables. Fruits All fresh, dried, or frozen fruit. Canned fruit in natural juice (without added sugar). Meat and other protein foods Skinless chicken or [place]. Ground chicken or [place]. Pork with fat trimmed off. Fish and seafood. Egg whites. Dried beans, peas, or lentils. Unsalted nuts, nut butters, and seeds. Unsalted canned beans. Lean cuts of beef with fat trimmed off. Low-sodium, lean deli meat. Dairy Low-fat (1%) or fat-free (skim) milk. Fat-free, low-fat, or reduced-fat cheeses. Nonfat, low-sodium ricotta or cottage cheese. Low-fat or nonfat yogurt. Low-fat, low-sodium cheese. Fats and oils Soft margarine without trans fats. Vegetable oil. Low-fat, reduced-fat, or light mayonnaise and salad dressings (reduced-sodium). Canola, safflower, olive, soybean, and sunflower oils. Avocado. Seasoning and other foods Herbs. Spices. Seasoning mixes without salt. Unsalted popcorn and pretzels. Fat-free sweets. What foods are not recommended? The items listed may not be a complete list. Talk with your dietitian about what dietary choices are best for you. Grains Baked goods made with fat, such as croissants, muffins, or some breads. Dry pasta or rice meal packs. Vegetables Creamed or fried vegetables. Vegetables in a cheese sauce. Regular canned vegetables (not low-sodium or reduced-sodium). Regular canned tomato sauce and paste (not low-sodium or reduced-sodium). Regular tomato and vegetable juice (not low-sodium or reduced-sodium). Angie Fava. Olives. Fruits Canned fruit in a light or heavy syrup. Fried fruit. Fruit  in cream or butter sauce. Meat and other protein foods Fatty cuts of meat. Ribs. Fried meat. Berniece Salines. Sausage. Bologna and other processed lunch meats. Salami. Fatback. Hotdogs. Bratwurst. Salted nuts and seeds. Canned beans with added salt. Canned or smoked fish. Whole eggs or egg yolks. Chicken or [place] with skin. Dairy Whole or 2% milk, cream, and half-and-half. Whole or full-fat cream cheese. Whole-fat or sweetened yogurt. Full-fat cheese. Nondairy creamers. Whipped toppings. Processed cheese and cheese spreads. Fats and oils Butter. Stick margarine. Lard. Shortening. Ghee. Bacon fat. Tropical oils, such as coconut, palm kernel, or palm oil. Seasoning and other foods Salted popcorn and pretzels. Onion salt, garlic salt, seasoned salt, table salt, and sea salt. Worcestershire sauce. Tartar sauce. Barbecue sauce. Teriyaki sauce. Soy sauce, including reduced-sodium. Steak sauce. Canned and packaged gravies. Fish sauce. Oyster sauce. Cocktail sauce. Horseradish that you find on the shelf. Ketchup. Mustard. Meat flavorings and tenderizers. Bouillon cubes. Hot sauce and Tabasco sauce. Premade or packaged marinades. Premade or packaged taco seasonings. Relishes. Regular salad dressings. Where to find more information:  National Heart, Lung, and [place]: https://wilson-eaton.com/  American Heart Association: www.heart.org Summary  The DASH eating plan is a healthy eating plan that has been shown to reduce high blood pressure (hypertension). It may also reduce your risk for type  2 diabetes, heart disease, and stroke.  With the DASH eating plan, you should limit salt (sodium) intake to 2,300 mg a day. If you have hypertension, you may need to reduce your sodium intake to 1,500 mg a day.  When on the DASH eating plan, aim to eat more fresh fruits and vegetables, whole grains, lean proteins, low-fat dairy, and heart-healthy fats.  Work with your health care provider or diet and nutrition specialist  (dietitian) to adjust your eating plan to your individual calorie needs. This information is not intended to replace advice given to you by your health care provider. Make sure you discuss any questions you have with your health care provider. Document Released: 01/02/2011 Document Revised: 01/07/2016 Document Reviewed: 01/07/2016 Elsevier Interactive Patient Education  [employer].

## 2017-06-09 NOTE — Progress Notes (Signed)
Subjective:    Patient ID: Susan Allison, female    DOB: 05-Sep-1960, 57 y.o.   MRN: [id number]  HPI  Susan Allison is a 57 year old female who presents today to establish care and discuss the problems mentioned below. Will obtain old records.  1) Essential Hypertension: Previously managed on lisinopril-HCTZ 10-12.5 mg which was recently discontinued given evidence of cholestatic jaundice. She had been on lisinopril-HCTZ since April 12th which was a renewed prescription as she ran out of this medication. She couldn't get refills as her prior PCP's office was closed. She did experience a dry/tickle cough occasionally with this medication.  She's been without her medication recently since 06/04/17. She's checking her BP at home off of her medication and is getting readings of 130's-140's/70's. She denies headaches, shortness of breath, dizziness. She has noticed chest pain.   BP Readings from Last 3 Encounters:  06/09/17 130/80  06/02/17 122/82  05/26/17 138/79     2) Susan Allison: Diagnosed in early May 2019 and previously following with general surgery. Abdominal ultrasound in early May with 11 mm dependant calculi in gallbladder.   LFT's on 05/08/17 with Alk Phos of 86, AST of 17, ALT of 11. LFT's on 05/26/17 with Alk Phos of 322, AST of 312, ALT of 703. LFT's from 06/02/17 with Alk Phos of 243, AST of 215, ALT of 505.   She is due [facility]. Her elevated LFT's were presumed to be secondary to lisinopril.   3) Pruritis: Located to the entire body including upper and lower extremities, ears, anterior and posterior chest. This has been present constantly for the past 3 weeks. She's tried OTC cortisone cream, anti itch cream, Eucerin lotion, and benadryl without improvement. She is compliant to her Zyrtec daily for the past one week.   She denies changes in detergents, soaps, foods, medications. No one else in her household is itching. She's not  been working in the yard. Denies rashes.   4) Chest Pain: Located intermittently to the left chest behind left breast lwhich began about one month ago. She'll notice her pain with random, mild movement (preparing for bedtime). Her pain doesn't improve with rest as she'll notice it when laying down. She describes her pain as sharp, "lightening" pain. She denies radiation of pain, nausea, dizziness, increased stress. She also endorses fatigue, and occasional esophageal burning with certain foods.   She mentioned this during an appointment through the employee health clinic on 05/12/17 who completed an ECG. ECG with NSR with rate of 76, "non specific T-wave changes". She was advised to start aspirin 81 mg and follow up with PCP.   Her last mammogram was without evidence of malignancy on 04/30/2017.   Review of Systems  Constitutional: Positive for fatigue.  HENT: Negative for congestion.   Eyes: Negative for visual disturbance.  Respiratory: Negative for shortness of breath.   Cardiovascular: Positive for chest pain.  Gastrointestinal: Negative for abdominal pain and nausea.       Occasional esophageal burning  Endocrine: Negative for polyuria.  Musculoskeletal: Negative for myalgias.  Skin: Negative for color change and rash.       Pruritus   Neurological: Negative for dizziness and headaches.       Past Medical History:  Diagnosis Date  . GERD (gastroesophageal reflux disease)   . Joint pain   . Mastodynia   . Sinus problem      Social History   Socioeconomic History  .  Marital status: Married    Spouse name: Not on file  . Number of children: Not on file  . Years of education: Not on file  . Highest education level: Not on file  Occupational History  . Not on file  Social Needs  . Financial resource strain: Not on file  . Food insecurity:    Worry: Not on file    Inability: Not on file  . Transportation needs:    Medical: Not on file    Non-medical: Not on file    Tobacco Use  . Smoking status: Never Smoker  . Smokeless tobacco: Never Used  Substance and Sexual Activity  . Alcohol use: No  . Drug use: No  . Sexual activity: Yes    Birth control/protection: Post-menopausal  Lifestyle  . Physical activity:    Days per week: Not on file    Minutes per session: Not on file  . Stress: Not on file  Relationships  . Social connections:    Talks on phone: Not on file    Gets together: Not on file    Attends religious service: Not on file    Active member of club or organization: Not on file    Attends meetings of clubs or organizations: Not on file    Relationship status: Not on file  . Intimate partner violence:    Fear of current or ex partner: Not on file    Emotionally abused: Not on file    Physically abused: Not on file    Forced sexual activity: Not on file  Other Topics Concern  . Not on file  Social History Narrative   Married.   2 children.    Works as a Administrator at [employer].   Enjoys gardening, Secretary/administrator, baking, movies.     Past Surgical History:  Procedure Laterality Date  . COLONOSCOPY  2011  . ESOPHAGOGASTRODUODENOSCOPY N/A 08/14/2014   Procedure: ESOPHAGOGASTRODUODENOSCOPY (EGD);  Surgeon: Hulen Luster, MD;  Location: [facility] ENDOSCOPY;  Service: Gastroenterology;  Laterality: N/A;    Family History  Problem Relation Age of Onset  . Breast cancer Other        40's  . Hypertension Mother   . Liver disease Mother   . Prostate cancer Father   . Diabetes Brother     Allergies  Allergen Reactions  . Lisinopril     Possible cause of itching/ elevated liver enzymes - discontinued 06/03/17.     Current Outpatient Medications on File Prior to Visit  Medication Sig Dispense Refill  . aspirin EC 81 MG tablet Take 81 mg by mouth daily.    Marland Kitchen azelastine (ASTELIN) 0.1 % nasal spray Place 2 sprays into both nostrils 2 (two) times daily. Use in each nostril as directed    . calcium carbonate (OS-CAL) 600 MG TABS tablet  Take 600 mg by mouth 2 (two) times daily with a meal.    . cetirizine (ZYRTEC) 10 MG tablet Take 10 mg by mouth daily.    . cholecalciferol (VITAMIN D) 1000 units tablet Take 1,000 Units by mouth daily.     No current facility-administered medications on file prior to visit.     BP 130/80   Pulse 78   Temp 98.1 F (36.7 C) (Oral)   Ht 4' 11"  (1.499 m)   Wt 126 lb 4 oz (57.3 kg)   SpO2 99%   BMI 25.50 kg/m    Objective:   Physical Exam  Constitutional: She is oriented to  person, place, and time. She appears well-nourished.  Neck: Neck supple.  Cardiovascular: Normal rate and regular rhythm.  Pulmonary/Chest: Effort normal and breath sounds normal.  Abdominal: Soft.  Musculoskeletal: Normal range of motion.  Neurological: She is alert and oriented to person, place, and time.  Skin: Skin is warm and dry.  Psychiatric: She has a normal mood and affect.          Assessment & Plan:

## 2017-06-09 NOTE — Assessment & Plan Note (Signed)
Steady rise since re-initiation of lisinopril on 05/08/17, decline overall since medication was removed. Due for repeat LFT's tomorrow. Continue off lisinopril.

## 2017-06-09 NOTE — Assessment & Plan Note (Signed)
Stable in the office today off of BP medications. Discussed for her to continue to monitor BP at home and report readings at or above 140/90.

## 2017-06-09 NOTE — Assessment & Plan Note (Signed)
To her entire body for the last 3 weeks. Could have been secondary to lisinopril and disruption to liver enzymes as there have been no there changes/encouters to suggest otherwise.  Continue Zyrtec daily, add in Pepcid once to twice daily.   She will update if symptoms persist.

## 2017-06-09 NOTE — Assessment & Plan Note (Signed)
Occurs with movement, continues with rest. Exam today without tenderness upon palpation. ECG today with NSR, rate of 70. No ST elevation/depression. T-wave inversion in AVR and V1, otherwise unremarkable.  ECG from 05/12/17 similar.  She's really unsure of when her pain will present so she's going to monitor. Also no obvious signs of ACS today, especially given grossly normal ECG. Continue aspirin, consider stress test to rule out exertional ischemia.

## 2017-06-09 NOTE — Assessment & Plan Note (Signed)
Evidence on Ultrasound on 05/27/17. Suspected to be secondary to re-initiation of lisinopril which was discontinued.   Repeat LFT's pending for tomorrow.

## 2017-06-10 ENCOUNTER — Other Ambulatory Visit: Payer: BLUE CROSS/BLUE SHIELD

## 2017-06-10 DIAGNOSIS — R748 Abnormal levels of other serum enzymes: Secondary | ICD-10-CM

## 2017-06-10 DIAGNOSIS — K8020 Calculus of gallbladder without cholecystitis without obstruction: Secondary | ICD-10-CM

## 2017-06-11 LAB — HEPATIC FUNCTION PANEL: Bilirubin, Direct: 1.08 mg/dL — ABNORMAL HIGH (ref 0.00–0.40)

## 2017-06-11 LAB — COMPREHENSIVE METABOLIC PANEL
ALBUMIN: 4.1 g/dL (ref 3.5–5.5)
ALT: 306 IU/L — ABNORMAL HIGH (ref 0–32)
AST: 149 IU/L — AB (ref 0–40)
Albumin/Globulin Ratio: 1.7 (ref 1.2–2.2)
Alkaline Phosphatase: 188 IU/L — ABNORMAL HIGH (ref 39–117)
BUN / CREAT RATIO: 10 (ref 9–23)
BUN: 11 mg/dL (ref 6–24)
Bilirubin Total: 1.5 mg/dL — ABNORMAL HIGH (ref 0.0–1.2)
CALCIUM: 9.4 mg/dL (ref 8.7–10.2)
CO2: 22 mmol/L (ref 20–29)
CREATININE: 1.11 mg/dL — AB (ref 0.57–1.00)
Chloride: 108 mmol/L — ABNORMAL HIGH (ref 96–106)
GFR calc Af Amer: 63 mL/min/{1.73_m2} (ref >59)
GFR, EST NON AFRICAN AMERICAN: 55 mL/min/{1.73_m2} — AB (ref >59)
GLOBULIN, TOTAL: 2.4 g/dL (ref 1.5–4.5)
GLUCOSE: 86 mg/dL (ref 65–99)
Potassium: 4.9 mmol/L (ref 3.5–5.2)
Sodium: 144 mmol/L (ref 134–144)
Total Protein: 6.5 g/dL (ref 6.0–8.5)

## 2017-06-11 LAB — CBC WITH DIFFERENTIAL/PLATELET
BASOS ABS: 0 10*3/uL (ref 0.0–0.2)
Basos: 1 %
EOS (ABSOLUTE): 0.2 10*3/uL (ref 0.0–0.4)
EOS: 5 %
HEMATOCRIT: 35.3 % (ref 34.0–46.6)
HEMOGLOBIN: 11.6 g/dL (ref 11.1–15.9)
IMMATURE GRANULOCYTES: 0 %
Immature Grans (Abs): 0 10*3/uL (ref 0.0–0.1)
LYMPHS: 33 %
Lymphocytes Absolute: 1.2 10*3/uL (ref 0.7–3.1)
MCH: 28.4 pg (ref 26.6–33.0)
MCHC: 32.9 g/dL (ref 31.5–35.7)
MCV: 86 fL (ref 79–97)
MONOCYTES: 7 %
Monocytes Absolute: 0.2 10*3/uL (ref 0.1–0.9)
NEUTROS PCT: 54 %
Neutrophils Absolute: 1.9 10*3/uL (ref 1.4–7.0)
Platelets: 309 10*3/uL (ref 150–379)
RBC: 4.09 x10E6/uL (ref 3.77–5.28)
RDW: 16.4 % — ABNORMAL HIGH (ref 12.3–15.4)
WBC: 3.5 10*3/uL (ref 3.4–10.8)

## 2017-06-11 LAB — LIPASE: Lipase: 59 U/L (ref 14–72)

## 2017-06-11 LAB — AMYLASE: Amylase: 87 U/L (ref 31–124)

## 2017-06-12 ENCOUNTER — Telehealth: Payer: Self-pay | Admitting: Primary Care

## 2017-06-12 DIAGNOSIS — R079 Chest pain, unspecified: Secondary | ICD-10-CM

## 2017-06-12 NOTE — Telephone Encounter (Signed)
Copied from CRM [id number]. Topic: Quick Communication - Lab Results >> Jun 11, 2017  5:13 PM Nanci Pina, [place] wrote: Called patient to inform them of 06/10/17 lab results. When patient returns call, triage nurse may disclose results.  Also, asked pt to call back letting Jae Dire know if she agrees to the stress test or not. >> Jun 12, 2017 10:53 AM Rudi Coco, [place] wrote: Pt. Agreed to take the stress test

## 2017-06-12 NOTE — Telephone Encounter (Signed)
Copied from CRM [id number]. Topic: Quick Communication - Lab Results >> Jun 11, 2017  5:13 PM Nanci Pina, [place] wrote: Called patient to inform them of 06/10/17 lab results. When patient returns call, triage nurse may disclose results.  Also, asked pt to call back letting Jae Dire know if she agrees to the stress test or not.

## 2017-06-15 ENCOUNTER — Ambulatory Visit: Payer: BLUE CROSS/BLUE SHIELD | Admitting: Family Medicine

## 2017-06-15 ENCOUNTER — Telehealth: Payer: Self-pay

## 2017-06-15 ENCOUNTER — Encounter: Payer: Self-pay | Admitting: Family Medicine

## 2017-06-15 VITALS — BP 142/78 | HR 81 | Temp 98.0°F | Ht 59.0 in | Wt 125.8 lb

## 2017-06-15 DIAGNOSIS — I10 Essential (primary) hypertension: Secondary | ICD-10-CM

## 2017-06-15 NOTE — Patient Instructions (Addendum)
Good to see you today  Your blood pressure is not dropping when you change position- this is good  Please only check your blood pressure 1 or 2 times a week, bring your readings to your next appointment  You can take Tylenol 2 tablets every 8 to 12 hours for headache  Be sure to drink enough water to make your urine light yellow How to Take Your Blood Pressure You can take your blood pressure at home with a machine. You may need to check your blood pressure at home:  To check if you have high blood pressure (hypertension).  To check your blood pressure over time.  To make sure your blood pressure medicine is working.  Supplies needed: You will need a blood pressure machine, or monitor. You can buy one at a drugstore or online. When choosing one:  Choose one with an arm cuff.  Choose one that wraps around your upper arm. Only one finger should fit between your arm and the cuff.  Do not choose one that measures your blood pressure from your wrist or finger.  Your doctor can suggest a monitor. How to prepare Avoid these things for 30 minutes before checking your blood pressure:  Drinking caffeine.  Drinking alcohol.  Eating.  Smoking.  Exercising.  Five minutes before checking your blood pressure:  Pee.  Sit in a dining chair. Avoid sitting in a soft couch or armchair.  Be quiet. Do not talk.  How to take your blood pressure Follow the instructions that came with your machine. If you have a digital blood pressure monitor, these may be the instructions: 1. Sit up straight. 2. Place your feet on the floor. Do not cross your ankles or legs. 3. Rest your left arm at the level of your heart. You may rest it on a table, desk, or chair. 4. Pull up your shirt sleeve. 5. Wrap the blood pressure cuff around the upper part of your left arm. The cuff should be 1 inch (2.5 cm) above your elbow. It is best to wrap the cuff around bare skin. 6. Fit the cuff snugly around your  arm. You should be able to place only one finger between the cuff and your arm. 7. Put the cord inside the groove of your elbow. 8. Press the power button. 9. Sit quietly while the cuff fills with air and loses air. 10. Write down the numbers on the screen. 11. Wait 2-3 minutes and then repeat steps 1-10.  What do the numbers mean? Two numbers make up your blood pressure. The first number is called systolic pressure. The second is called diastolic pressure. An example of a blood pressure reading is "120 over 80" (or 120/80). If you are an adult and do not have a medical condition, use this guide to find out if your blood pressure is normal: Normal  First number: below 120.  Second number: below 80. Elevated  First number: 120-129.  Second number: below 80. Hypertension stage 1  First number: 130-139.  Second number: 80-89. Hypertension stage 2  First number: 140 or above.  Second number: 90 or above. Your blood pressure is above normal even if only the top or bottom number is above normal. Follow these instructions at home:  Check your blood pressure as often as your doctor tells you to.  Take your monitor to your next doctor's appointment. Your doctor will: ? Make sure you are using it correctly. ? Make sure it is working right.  Make  sure you understand what your blood pressure numbers should be.  Tell your doctor if your medicines are causing side effects. Contact a doctor if:  Your blood pressure keeps being high. Get help right away if:  Your first blood pressure number is higher than 180.  Your second blood pressure number is higher than 120. This information is not intended to replace advice given to you by your health care provider. Make sure you discuss any questions you have with your health care provider. Document Released: 12/27/2007 Document Revised: 12/12/2015 Document Reviewed: 06/22/2015 Elsevier Interactive Patient Education  [employer].

## 2017-06-15 NOTE — Telephone Encounter (Signed)
I spoke with Susan Allison and she did not get seen over weekend; Susan Allison has not taken BP today; Susan Allison has just got up; Susan Allison has slight light headedness; no other symptoms. Susan Allison scheduled appt today at 3:30 with Harlin Heys FNP. If Susan Allison condition changes or worsens prior to appt Susan Allison will go to ED.

## 2017-06-15 NOTE — Progress Notes (Signed)
Subjective:    Patient ID: Susan Allison, female    DOB: October 14, 1960, 57 y.o.   MRN: [id number]  HPI This is a 57 yo female who presents today with elevated blood pressures over last several days. Was checking her blood pressure over the last couple of weeks after coming off of lisinopril. Currently not on anything for her blood pressure. Has been having a lot of itching and was recently started on daily pepcid and Zyrtec. Has headaches with blood pressure being elevated, across eyes. Feeling lightheaded with movement a couple days a week. Dinks 5 glasses of liquid a day. Feels off and on jitteriness. Noticed after starting pepcid. Not sleeping well.  Started feeling poorly for about 2 months. Feels anxious about her health. Had elevated LFTs thought secondary to lisinopril, numbers improving. Acute hep panel negative.  No palpitations or presyncope. She has had some non specific chest pain and an exercise tolerance test has been ordered and is awaiting scheduling.    Past Medical History:  Diagnosis Date  . GERD (gastroesophageal reflux disease)   . Joint pain   . Mastodynia   . Sinus problem    Past Surgical History:  Procedure Laterality Date  . COLONOSCOPY  2011  . ESOPHAGOGASTRODUODENOSCOPY N/A 08/14/2014   Procedure: ESOPHAGOGASTRODUODENOSCOPY (EGD);  Surgeon: [name], MD;  Location: [facility] ENDOSCOPY;  Service: Gastroenterology;  Laterality: N/A;   Family History  Problem Relation Age of Onset  . Breast cancer Other        40's  . Hypertension Mother   . Liver disease Mother   . Prostate cancer Father   . Diabetes Brother    Social History   Tobacco Use  . Smoking status: Never Smoker  . Smokeless tobacco: Never Used  Substance Use Topics  . Alcohol use: No  . Drug use: No      Review of Systems Per HPI    Objective:   Physical Exam  Constitutional: She is oriented to person, place, and time. She appears well-developed and well-nourished.  HENT:  Head:  Normocephalic and atraumatic.  Eyes: Conjunctivae are normal.  Cardiovascular: Normal rate, regular rhythm and normal heart sounds.  Pulmonary/Chest: Effort normal and breath sounds normal.  Neurological: She is alert and oriented to person, place, and time.  Skin: Skin is warm and dry.  Psychiatric: She has a normal mood and affect. Her behavior is normal. Judgment and thought content normal.  Mildly anxious appearing.   Vitals reviewed.     BP (!) 142/78 (BP Location: Left Arm, Patient Position: Sitting, Cuff Size: Normal)   Pulse 81   Temp 98 F (36.7 C) (Oral)   Ht  (1.499 m)   Wt 125 lb 12 oz (57 kg)   SpO2 99%   BMI 25.40 kg/m  Wt Readings from Last 3 Encounters:  06/15/17 125 lb 12 oz (57 kg)  06/09/17 126 lb 4 oz (57.3 kg)  06/02/17 124 lb (56.2 kg)       BP Readings from Last 3 Encounters:  06/09/17 130/80  06/02/17 122/82  05/26/17 138/79   Orthostatic VS for the past 24 hrs:  BP- Lying Pulse- Lying BP- Sitting Pulse- Sitting BP- Standing at 0 minutes Pulse- Standing at 0 minutes  06/15/17 1607 161/82 74 169/84 76 160/78 78     Assessment & Plan:  1. Hypertension, unspecified type - reviewed home readings and encouraged patient to check less frequently, keeping a log to bring with her to next  visit - not orthostatic today - continue low salt diet  - RTC precautions reviewed - follow up on file with PCP   Olean Ree, FNP-BC  [facility], [facility]  06/16/2017 7:09 AM

## 2017-06-15 NOTE — Telephone Encounter (Signed)
PLEASE NOTE: All timestamps contained within this report are represented as [place] Standard Time. CONFIDENTIALTY NOTICE: This fax transmission is intended only for the addressee. It contains information that is legally privileged, confidential or otherwise protected from use or disclosure. If you are not the intended recipient, you are strictly prohibited from reviewing, disclosing, copying using or disseminating any of this information or taking any action in reliance on or regarding this information. If you have received this fax in error, please notify us immediately by telephone so that we can arrange for its return to [place]. Phone: [phone], Toll-Free: [phone], Fax: [phone] Page: 1 of 2 Call Id: [id number] [facility] Night - Client TELEPHONE ADVICE RECORD [facility] Patient Name: Susan Allison Gender: Female DOB: 10/12/1960 Age: 57 Y 6 M 5 D Return Phone Number: [phone] (Primary), [phone] (Secondary) Address: [place] [address] Client [facility] Night - Client Client Site [facility] [place] - Night Physician Vernona Rieger - NP Contact Type Call Who Is Calling Patient / Member / Family / Caregiver Call Type Triage / Clinical Relationship To Patient Self Return Phone Number [phone] (Primary) Chief Complaint Headache Reason for Call Symptomatic / Request for Health Information Initial Comment Caller states if BP went over 140/90 to let the office know; today 147/78; fatigue and light headache; Fri was 158/89, yesterday 166/78; no sx; Translation No Nurse Assessment Nurse: Deatra James RN, Corrie Dandy Date/Time (Eastern Time): 06/14/2017 11:35:54 AM Confirm and document reason for call. If symptomatic, describe symptoms. ---Patient she was told if BP went over 140/90 to let the office know; today 147/78; fatigue and light headache; Fri was 158/89, yesterday 166/78; no  sx; Does the patient have any new or worsening symptoms? ---Yes Will a triage be completed? ---Yes Related visit to physician within the last 2 weeks? ---No Does the PT have any chronic conditions? (i.e. diabetes, asthma, etc.) ---Yes List chronic conditions. ---"abnormal EKG in the past" Is this a behavioral health or substance abuse call? ---No Guidelines Guideline Title Affirmed Question Affirmed Notes Nurse Date/Time (Eastern Time) High Blood Pressure [1] Systolic BP >= 130 OR Diastolic >= 80 AND [2] pregnant Carvel Getting 06/14/2017 11:37:07 AM Disp. Time Lamount Cohen Time) Disposition Final User 06/14/2017 11:39:18 AM See PCP When Office is Open (within 3 days) Yes Noe, RN, Corrie Dandy PLEASE NOTE: All timestamps contained within this report are represented as [place] Standard Time. CONFIDENTIALTY NOTICE: This fax transmission is intended only for the addressee. It contains information that is legally privileged, confidential or otherwise protected from use or disclosure. If you are not the intended recipient, you are strictly prohibited from reviewing, disclosing, copying using or disseminating any of this information or taking any action in reliance on or regarding this information. If you have received this fax in error, please notify us immediately by telephone so that we can arrange for its return to [place]. Phone: [phone], Toll-Free: [phone], Fax: [phone] Page: 2 of 2 Call Id: [id number] Caller Disagree/Comply Comply Caller Understands Yes PreDisposition Call Doctor Care Advice Given Per Guideline SEE PCP WITHIN 3 DAYS: CALL BACK IF: * Weakness or numbness of the face, arm or leg on one side of the body occurs * Difficulty walking, difficulty talking, or severe headache occurs * Chest pain or difficulty breathing occurs * Blurred vision or face swelling occurs * You become worse. CARE ADVICE given per High Blood Pressure (Adult) guideline.

## 2017-06-15 NOTE — Telephone Encounter (Signed)
Noted, orders placed for stress test.

## 2017-06-16 ENCOUNTER — Encounter: Payer: Self-pay | Admitting: Family Medicine

## 2017-06-24 ENCOUNTER — Other Ambulatory Visit (INDEPENDENT_AMBULATORY_CARE_PROVIDER_SITE_OTHER): Payer: BLUE CROSS/BLUE SHIELD

## 2017-06-24 DIAGNOSIS — R945 Abnormal results of liver function studies: Secondary | ICD-10-CM

## 2017-06-24 DIAGNOSIS — R7989 Other specified abnormal findings of blood chemistry: Secondary | ICD-10-CM

## 2017-06-25 DIAGNOSIS — M79642 Pain in left hand: Secondary | ICD-10-CM | POA: Insufficient documentation

## 2017-06-25 LAB — HEPATIC FUNCTION PANEL
ALK PHOS: 135 U/L — AB (ref 39–117)
ALT: 150 U/L — AB (ref 0–35)
AST: 81 U/L — AB (ref 0–37)
Albumin: 4.2 g/dL (ref 3.5–5.2)
BILIRUBIN DIRECT: 0.4 mg/dL — AB (ref 0.0–0.3)
BILIRUBIN TOTAL: 0.9 mg/dL (ref 0.2–1.2)
Total Protein: 7.5 g/dL (ref 6.0–8.3)

## 2017-09-09 ENCOUNTER — Ambulatory Visit: Payer: BLUE CROSS/BLUE SHIELD | Admitting: Primary Care

## 2017-09-09 ENCOUNTER — Encounter: Payer: Self-pay | Admitting: Primary Care

## 2017-09-09 VITALS — BP 130/78 | HR 73 | Temp 98.3°F | Ht 59.0 in | Wt 124.5 lb

## 2017-09-09 DIAGNOSIS — I10 Essential (primary) hypertension: Secondary | ICD-10-CM | POA: Diagnosis not present

## 2017-09-09 DIAGNOSIS — R079 Chest pain, unspecified: Secondary | ICD-10-CM

## 2017-09-09 DIAGNOSIS — R945 Abnormal results of liver function studies: Secondary | ICD-10-CM

## 2017-09-09 DIAGNOSIS — R7989 Other specified abnormal findings of blood chemistry: Secondary | ICD-10-CM

## 2017-09-09 DIAGNOSIS — L299 Pruritus, unspecified: Secondary | ICD-10-CM

## 2017-09-09 LAB — HEPATIC FUNCTION PANEL
ALBUMIN: 4.3 g/dL (ref 3.5–5.2)
ALK PHOS: 78 U/L (ref 39–117)
ALT: 11 U/L (ref 0–35)
AST: 15 U/L (ref 0–37)
BILIRUBIN TOTAL: 0.4 mg/dL (ref 0.2–1.2)
Bilirubin, Direct: 0 mg/dL (ref 0.0–0.3)
Total Protein: 7.1 g/dL (ref 6.0–8.3)

## 2017-09-09 NOTE — Progress Notes (Signed)
Subjective:    Patient ID: Susan Allison, female    DOB: 1960/10/22, 57 y.o.   MRN: [id number]  HPI  Susan Allison is a 57 year old female who presents today for follow up of hypertension.  She was last evaluated in mid May 2019 as a new patient. She had recently been removed from lisinopril after learning that it was causing an elevation in LFT's and cholestatic jaundice. Her blood pressure was stable in the office during her visit so we continued her off of anti-hypertensive treatment and asked her to monitor BP.   Since her last visit she's checking her BP at home and is getting readings of 130's/70's. She denies chest pain, headaches, dizziness, shortness of breath, pruritis, abdominal pain, nausea.   BP Readings from Last 3 Encounters:  09/09/17 130/78  06/15/17 (!) 142/78  06/09/17 130/80     Review of Systems  Respiratory: Negative for shortness of breath.   Cardiovascular: Negative for chest pain.  Gastrointestinal: Negative for abdominal pain and nausea.  Neurological: Negative for dizziness and headaches.       Past Medical History:  Diagnosis Date  . GERD (gastroesophageal reflux disease)   . Joint pain   . Mastodynia   . Sinus problem      Social History   Socioeconomic History  . Marital status: Married    Spouse name: Not on file  . Number of children: Not on file  . Years of education: Not on file  . Highest education level: Not on file  Occupational History  . Not on file  Social Needs  . Financial resource strain: Not on file  . Food insecurity:    Worry: Not on file    Inability: Not on file  . Transportation needs:    Medical: Not on file    Non-medical: Not on file  Tobacco Use  . Smoking status: Never Smoker  . Smokeless tobacco: Never Used  Substance and Sexual Activity  . Alcohol use: No  . Drug use: No  . Sexual activity: Yes    Birth control/protection: Post-menopausal  Lifestyle  . Physical activity:    Days per week: Not on  file    Minutes per session: Not on file  . Stress: Not on file  Relationships  . Social connections:    Talks on phone: Not on file    Gets together: Not on file    Attends religious service: Not on file    Active member of club or organization: Not on file    Attends meetings of clubs or organizations: Not on file    Relationship status: Not on file  . Intimate partner violence:    Fear of current or ex partner: Not on file    Emotionally abused: Not on file    Physically abused: Not on file    Forced sexual activity: Not on file  Other Topics Concern  . Not on file  Social History Narrative   Married.   2 children.    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   Enjoys gardening, Museum/gallery conservatorcanning food, baking, movies.     Past Surgical History:  Procedure Laterality Date  . COLONOSCOPY  2011  . ESOPHAGOGASTRODUODENOSCOPY N/A 08/14/2014   Procedure: ESOPHAGOGASTRODUODENOSCOPY (EGD);  Surgeon: Wallace CullensPaul Y Oh, MD;  [facility];  Service: Gastroenterology;  Laterality: N/A;    Family History  Problem Relation Age of Onset  . Breast cancer Other  40's  . Hypertension Mother   . Liver disease Mother   . Prostate cancer Father   . Diabetes Brother     Allergies  Allergen Reactions  . Lisinopril     Possible cause of itching/ elevated liver enzymes - discontinued 06/03/17.     Current Outpatient Medications on File Prior to Visit  Medication Sig Dispense Refill  . aspirin EC 81 MG tablet Take 81 mg by mouth daily.    . cetirizine (ZYRTEC) 10 MG tablet Take 10 mg by mouth daily.    . cholecalciferol (VITAMIN D) 1000 units tablet Take 1,000 Units by mouth daily.    . famotidine (PEPCID) 20 MG tablet Take 1 tablet (20 mg total) by mouth 2 (two) times daily. 60 tablet 0  . azelastine (ASTELIN) 0.1 % nasal spray Place 2 sprays into both nostrils 2 (two) times daily. Use in each nostril as directed    . calcium carbonate (OS-CAL) 600 MG TABS tablet Take 600 mg by mouth 2  (two) times daily with a meal.     No current facility-administered medications on file prior to visit.     BP 130/78   Pulse 73   Temp 98.3 F (36.8 C) (Oral)   Ht 4\' 11"  (1.499 m)   Wt 124 lb 8 oz (56.5 kg)   SpO2 99%   BMI 25.15 kg/m    Objective:   Physical Exam  Constitutional: She appears well-nourished.  Neck: Neck supple.  Cardiovascular: Normal rate and regular rhythm.  Respiratory: Effort normal and breath sounds normal.  Skin: Skin is warm and dry.           Assessment & Plan:

## 2017-09-09 NOTE — Assessment & Plan Note (Signed)
Stable in the office today, also on home readings. Continue to monitor. She will report consistent readings at or above 140/90.

## 2017-09-09 NOTE — Assessment & Plan Note (Signed)
Repeat LFT's today. Abdominal exam unremarkable.

## 2017-09-09 NOTE — Assessment & Plan Note (Signed)
Denies. Resolved.

## 2017-09-09 NOTE — Patient Instructions (Addendum)
Stop by the lab prior to leaving today. I will notify you of your results once received.   Continue to monitor your blood pressure as discussed, report consistent readings at or above 140/90.  It was a pleasure to see you today!

## 2017-09-09 NOTE — Assessment & Plan Note (Signed)
Resolved

## 2017-11-09 DIAGNOSIS — H6980 Other specified disorders of Eustachian tube, unspecified ear: Secondary | ICD-10-CM | POA: Diagnosis not present

## 2017-11-09 DIAGNOSIS — M26609 Unspecified temporomandibular joint disorder, unspecified side: Secondary | ICD-10-CM | POA: Diagnosis not present

## 2018-01-14 ENCOUNTER — Ambulatory Visit: Payer: Self-pay | Admitting: Adult Health

## 2018-01-14 ENCOUNTER — Encounter: Payer: Self-pay | Admitting: Adult Health

## 2018-01-14 VITALS — BP 179/82 | HR 81 | Temp 98.1°F | Resp 16 | Ht 59.0 in | Wt 131.0 lb

## 2018-01-14 DIAGNOSIS — J069 Acute upper respiratory infection, unspecified: Secondary | ICD-10-CM

## 2018-01-14 NOTE — Patient Instructions (Signed)

## 2018-01-14 NOTE — Progress Notes (Addendum)
Subjective:     Patient ID: Susan Allison, female   DOB: 10-Sep-1960, 57 y.o.   MRN: [id number]  HPI Blood pressure (!) 179/82, pulse 81, temperature 98.1 F (36.7 C), resp. rate 16, height 4\' 11"  (1.499 m), weight 131 lb (59.4 kg), SpO2 100 %. Patient is a 57 year old female in no acute distress who comes to the clinic for complaints of nasal congestion, sinus pressure x 3 days.   Patient  denies any fever, body aches,chills, rash, chest pain, shortness of breath, nausea, vomiting, or diarrhea.    Allergies  Allergen Reactions  . Lisinopril     Possible cause of itching/ elevated liver enzymes - discontinued 06/03/17.     Review of Systems  Constitutional: Negative for activity change, appetite change, chills, diaphoresis, fatigue, fever and unexpected weight change.  HENT: Positive for congestion, postnasal drip, rhinorrhea and sinus pressure. Negative for dental problem, drooling, ear discharge, ear pain, facial swelling, hearing loss, mouth sores, nosebleeds, sinus pain, sneezing, sore throat, tinnitus, trouble swallowing and voice change.   Respiratory: Negative.   Cardiovascular: Negative.   Gastrointestinal: Negative.   Genitourinary: Negative.   Musculoskeletal: Negative.   Skin: Negative.   Neurological: Negative.   Hematological: Negative.   Psychiatric/Behavioral: Negative.        Objective:   Physical Exam Constitutional:      General: She is not in acute distress.    Appearance: Normal appearance. She is not ill-appearing, toxic-appearing or diaphoretic.  HENT:     Head: Normocephalic and atraumatic.     Jaw: There is normal jaw occlusion.     Right Ear: Hearing, tympanic membrane, ear canal and external ear normal.     Left Ear: Hearing, tympanic membrane, ear canal and external ear normal.     Nose: Congestion and rhinorrhea present.     Right Sinus: No maxillary sinus tenderness or frontal sinus tenderness.     Left Sinus: No maxillary sinus tenderness or  frontal sinus tenderness.     Mouth/Throat:     Lips: Pink.     Mouth: Mucous membranes are moist.     Pharynx: Oropharynx is clear. Uvula midline. No pharyngeal swelling, oropharyngeal exudate, posterior oropharyngeal erythema or uvula swelling.     Tonsils: No tonsillar exudate. Swelling: 0 on the right. 0 on the left.  Eyes:     General: Lids are normal.     Pupils: Pupils are equal, round, and reactive to light.  Neck:     Musculoskeletal: Full passive range of motion without pain, normal range of motion and neck supple.     Trachea: Trachea and phonation normal.  Cardiovascular:     Rate and Rhythm: Normal rate and regular rhythm.  Pulmonary:     Effort: Pulmonary effort is normal. No respiratory distress.     Breath sounds: Normal breath sounds. No stridor. No wheezing, rhonchi or rales.  Chest:     Chest wall: No tenderness.  Abdominal:     Palpations: Abdomen is soft.  Musculoskeletal: Normal range of motion.  Lymphadenopathy:     Head:     Right side of head: No submental, submandibular, tonsillar, preauricular, posterior auricular or occipital adenopathy.     Left side of head: No submental, submandibular, tonsillar, preauricular, posterior auricular or occipital adenopathy.     Cervical: No cervical adenopathy.  Skin:    General: Skin is warm.     Capillary Refill: Capillary refill takes less than 2 seconds.  Nails: There is no clubbing.   Neurological:     Mental Status: She is alert and oriented to person, place, and time.  Psychiatric:        Attention and Perception: Attention normal.        Mood and Affect: Mood normal.        Speech: Speech normal.        Behavior: Behavior normal.        Thought Content: Thought content normal.        Cognition and Memory: Cognition normal.        Judgment: Judgment normal.        Assessment:     Upper respiratory tract infection, unspecified type   Results for orders placed or performed in visit on 01/14/18  (from the past 24 hour(s))  POCT Influenza A/B     Status: Normal   Collection Time: 01/15/18  8:38 AM  Result Value Ref Range   Influenza A, POC Negative Negative   Influenza B, POC Negative Negative       Plan:     Continue allergy medications you are currently on.  Discussed likely viral etiology.  Gave and reviewed After Visit Summary(AVS) with patient.   Monitor blood pressure schedule an appointment with your primary care provider for follow up.    Advised patient call the office or your primary care doctor for an appointment if no improvement within 72 hours or if any symptoms change or worsen at any time  Advised ER or urgent Care if after hours or on weekend. Call 911 for emergency symptoms at any time.Patinet verbalized understanding of all instructions given/reviewed and treatment plan and has no further questions or concerns at this time.

## 2018-01-15 ENCOUNTER — Encounter: Payer: Self-pay | Admitting: Adult Health

## 2018-01-15 LAB — POCT INFLUENZA A/B
Influenza A, POC: NEGATIVE
Influenza B, POC: NEGATIVE

## 2018-01-15 NOTE — Addendum Note (Signed)
Addended by: Berniece PapFLINCHUM, MICHELLE S on: 01/15/2018 08:32 AM   Modules accepted: Orders

## 2018-02-25 ENCOUNTER — Ambulatory Visit: Payer: BLUE CROSS/BLUE SHIELD | Admitting: Medical

## 2018-02-25 VITALS — BP 163/84 | HR 72 | Temp 98.0°F | Resp 16 | Ht 60.0 in | Wt 133.0 lb

## 2018-02-25 DIAGNOSIS — H6502 Acute serous otitis media, left ear: Secondary | ICD-10-CM

## 2018-02-25 DIAGNOSIS — J0100 Acute maxillary sinusitis, unspecified: Secondary | ICD-10-CM

## 2018-02-25 MED ORDER — BENZONATATE 100 MG PO CAPS: 100.0000 mg | ORAL_CAPSULE | Freq: Three times a day (TID) | ORAL | 0 refills | Status: DC | PRN

## 2018-02-25 MED ORDER — AMOXICILLIN-POT CLAVULANATE 875-125 MG PO TABS: 1.0000 | ORAL_TABLET | Freq: Two times a day (BID) | ORAL | 0 refills | Status: DC

## 2018-02-25 NOTE — Patient Instructions (Signed)
Sinusitis  Otitis Media, Adult  Otitis media means that the middle ear is red and swollen (inflamed) and full of fluid. The condition usually goes away on its own. Follow these instructions at home:  Take over-the-counter and prescription medicines only as told by your doctor.  If you were prescribed an antibiotic medicine, take it as told by your doctor. Do not stop taking the antibiotic even if you start to feel better.  Keep all follow-up visits as told by your doctor. This is important. Contact a doctor if:  You have bleeding from your nose.  There is a lump on your neck.  You are not getting better in 5 days.  You feel worse instead of better. Get help right away if:  You have pain that is not helped with medicine.  You have swelling, redness, or pain around your ear.  You get a stiff neck.  You cannot move part of your face (paralyzed).  You notice that the bone behind your ear hurts when you touch it.  You get a very bad headache. Summary  Otitis media means that the middle ear is red, swollen, and full of fluid.  This condition usually goes away on its own. In some cases, treatment may be needed.  If you were prescribed an antibiotic medicine, take it as told by your doctor. This information is not intended to replace advice given to you by your health care provider. Make sure you discuss any questions you have with your health care provider. Document Released: 07/02/2007 Document Revised: 02/04/2016 Document Reviewed: 02/04/2016 Elsevier Interactive Patient Education  2019 [employer]  Sinusitis, Adult Sinusitis is soreness and swelling (inflammation) of your sinuses. Sinuses are hollow spaces in the bones around your face. They are located:  Around your eyes.  In the middle of your forehead.  Behind your nose.  In your cheekbones. Your sinuses and nasal passages are lined with a fluid called mucus. Mucus drains out of your sinuses. Swelling can trap  mucus in your sinuses. This lets germs (bacteria, virus, or fungus) grow, which leads to infection. Most of the time, this condition is caused by a virus. What are the causes? This condition is caused by:  Allergies.  Asthma.  Germs.  Things that block your nose or sinuses.  Growths in the nose (nasal polyps).  Chemicals or irritants in the air.  Fungus (rare). What increases the risk? You are more likely to develop this condition if:  You have a weak body defense system (immune system).  You do a lot of swimming or diving.  You use nasal sprays too much.  You smoke. What are the signs or symptoms? The main symptoms of this condition are pain and a feeling of pressure around the sinuses. Other symptoms include:  Stuffy nose (congestion).  Runny nose (drainage).  Swelling and warmth in the sinuses.  Headache.  Toothache.  A cough that may get worse at night.  Mucus that collects in the throat or the back of the nose (postnasal drip).  Being unable to smell and taste.  Being very tired (fatigue).  A fever.  Sore throat.  Bad breath. How is this diagnosed? This condition is diagnosed based on:  Your symptoms.  Your medical history.  A physical exam.  Tests to find out if your condition is short-term (acute) or long-term (chronic). Your doctor may: ? Check your nose for growths (polyps). ? Check your sinuses using a tool that has a light (endoscope). ? Check  for allergies or germs. ? Do imaging tests, such as an MRI or CT scan. How is this treated? Treatment for this condition depends on the cause and whether it is short-term or long-term.  If caused by a virus, your symptoms should go away on their own within 10 days. You may be given medicines to relieve symptoms. They include: ? Medicines that shrink swollen tissue in the nose. ? Medicines that treat allergies (antihistamines). ? A spray that treats swelling of the nostrils. ? Rinses that help  get rid of thick mucus in your nose (nasal saline washes).  If caused by bacteria, your doctor may wait to see if you will get better without treatment. You may be given antibiotic medicine if you have: ? A very bad infection. ? A weak body defense system.  If caused by growths in the nose, you may need to have surgery. Follow these instructions at home: Medicines  Take, use, or apply over-the-counter and prescription medicines only as told by your doctor. These may include nasal sprays.  If you were prescribed an antibiotic medicine, take it as told by your doctor. Do not stop taking the antibiotic even if you start to feel better. Hydrate and humidify   Drink enough water to keep your pee (urine) pale yellow.  Use a cool mist humidifier to keep the humidity level in your home above 50%.  Breathe in steam for 10-15 minutes, 3-4 times a day, or as told by your doctor. You can do this in the bathroom while a hot shower is running.  Try not to spend time in cool or dry air. Rest  Rest as much as you can.  Sleep with your head raised (elevated).  Make sure you get enough sleep each night. General instructions   Put a warm, moist washcloth on your face 3-4 times a day, or as often as told by your doctor. This will help with discomfort.  Wash your hands often with soap and water. If there is no soap and water, use hand sanitizer.  Do not smoke. Avoid being around people who are smoking (secondhand smoke).  Keep all follow-up visits as told by your doctor. This is important. Contact a doctor if:  You have a fever.  Your symptoms get worse.  Your symptoms do not get better within 10 days. Get help right away if:  You have a very bad headache.  You cannot stop throwing up (vomiting).  You have very bad pain or swelling around your face or eyes.  You have trouble seeing.  You feel confused.  Your neck is stiff.  You have trouble breathing. Summary  Sinusitis is  swelling of your sinuses. Sinuses are hollow spaces in the bones around your face.  This condition is caused by tissues in your nose that become inflamed or swollen. This traps germs. These can lead to infection.  If you were prescribed an antibiotic medicine, take it as told by your doctor. Do not stop taking it even if you start to feel better.  Keep all follow-up visits as told by your doctor. This is important. This information is not intended to replace advice given to you by your health care provider. Make sure you discuss any questions you have with your health care provider. Document Released: 07/02/2007 Document Revised: 06/15/2017 Document Reviewed: 06/15/2017 Elsevier Interactive Patient Education  2019 [employer].

## 2018-02-25 NOTE — Progress Notes (Signed)
   Subjective:    Patient ID: Susan Allison, female    DOB: 1960-02-18, 58 y.o.   MRN: [id number]  HPI  58 yo female non acute distress.   About one month cough non productive, throat scartchy at times and left ear pain full ear. Mild shortness of breath with coughing, occasionally has heard wheezing. No fever or chills. No chest pain. Taking childrens mucinex.  Blood pressure (!) 163/84, pulse 72, temperature 98 F (36.7 C), resp. rate 16, height 5' (1.524 m), weight 133 lb (60.3 kg), SpO2 100 %.  Allergies  Allergen Reactions  . Lisinopril     Possible cause of itching/ elevated liver enzymes - discontinued 06/03/17.      Review of Systems  Constitutional: Positive for fatigue. Negative for chills and unexpected weight change.  HENT: Positive for congestion, ear pain (left), sinus pressure and sinus pain. Negative for ear discharge, sore throat and voice change.   Eyes: Negative for discharge, itching and visual disturbance.  Respiratory: Positive for cough (non productive), chest tightness (sometimes), shortness of breath (with coughing) and wheezing.   Cardiovascular: Negative for chest pain.  Musculoskeletal: Negative for myalgias.  Neurological: Negative for headaches.       Objective:   Physical Exam Constitutional:      Appearance: Normal appearance.  HENT:     Head: Normocephalic and atraumatic.     Jaw: There is normal jaw occlusion.     Right Ear: A middle ear effusion is present. Tympanic membrane is erythematous.     Left Ear: A middle ear effusion is present.     Nose: Congestion present.     Right Turbinates: Swollen.     Mouth/Throat:     Mouth: Mucous membranes are moist.     Pharynx: Oropharynx is clear.  Eyes:     Extraocular Movements: Extraocular movements intact.     Conjunctiva/sclera: Conjunctivae normal.     Pupils: Pupils are equal, round, and reactive to light.  Neurological:     Mental Status: She is alert.           Assessment &  Plan:  Sinusitis maxillary otits media left Elevated blood pressure to follow up with primary in the next week. Meds ordered this encounter  Medications  . amoxicillin-clavulanate (AUGMENTIN) 875-125 MG tablet    Sig: Take 1 tablet by mouth 2 (two) times daily.    Dispense:  20 tablet    Refill:  0  . benzonatate (TESSALON PERLES) 100 MG capsule    Sig: Take 1 capsule (100 mg total) by mouth 3 (three) times daily as needed.    Dispense:  30 capsule    Refill:  0  Declines Albuterol inhaler or prescribed cough medications.  reeturn or follow up with your PCP if not improving in 3-5 days. Patient verbalizes understanding and has no questions at discharge.

## 2018-03-02 DIAGNOSIS — H6983 Other specified disorders of Eustachian tube, bilateral: Secondary | ICD-10-CM | POA: Diagnosis not present

## 2018-03-17 ENCOUNTER — Telehealth: Payer: Self-pay

## 2018-03-17 NOTE — Telephone Encounter (Signed)
Ok with me pls sch   tMS

## 2018-03-17 NOTE — Telephone Encounter (Signed)
Copied from CRM [id number]. Topic: Appointment Scheduling - Transfer of Care >> Mar 17, 2018 11:08 AM Leafy Ro wrote: Pt is requesting to transfer FROM: Susan Allison  Pt is requesting to transfer TO: dr French Ana mclean-scocuzza Reason for requested transfer: [place] location is closer to her  Send CRM to patient's current PCP (transferring FROM).

## 2018-03-17 NOTE — Telephone Encounter (Signed)
Okay with me, thank you 

## 2018-03-24 NOTE — Progress Notes (Signed)
PCP: Doreene Nest, NP   Chief Complaint  Patient presents with  . Gynecologic Exam    HPI:      [name] is a 58 y.o. [phone] who LMP was No LMP recorded. Patient is postmenopausal., presents today for her annual examination.  Her menses are absent due to menopause. She does not have intermenstrual bleeding. She does have occas vasomotor sx.   Sex activity: single partner, contraception - post menopausal status. Occas sex active. She does not have vaginal dryness.  Last Pap: February 27, 2015  Results were: no abnormalities /neg HPV DNA.  Hx of STDs: none  Last mammogram: 04/30/17 Results were: normal--routine follow-up in 12 months There is a FH of breast cancer in her mat niece, genetic testing not indicated for pt. There is no FH of ovarian cancer. The patient does do self-breast exams.  Colonoscopy: colonoscopy 9 years ago without abnormalities.  Repeat due after 10 years per pt, but she will check. I had after 5 yrs in my notes.  Tobacco use: The patient denies current or previous tobacco use. Alcohol use: none Exercise: moderately active  She does get adequate calcium and Vitamin D in her diet.  Labs with PCP. Started on BP meds last yr but had allergic reaction to lisinopril, so meds d/c'd. Pt following Bps currently.   Diagnosed with gallstones 4/19 but surgery not needed. Saw Dr. Lemar Livings  Past Medical History:  Diagnosis Date  . GERD (gastroesophageal reflux disease)   . Joint pain   . Mastodynia   . Sinus problem     Past Surgical History:  Procedure Laterality Date  . COLONOSCOPY  2011  . ESOPHAGOGASTRODUODENOSCOPY N/A 08/14/2014   Procedure: ESOPHAGOGASTRODUODENOSCOPY (EGD);  Surgeon: [name], MD;  Location: [facility] ENDOSCOPY;  Service: Gastroenterology;  Laterality: N/A;    Family History  Problem Relation Age of Onset  . Breast cancer Other        40's  . Hypertension Mother   . Liver disease Mother   . Prostate cancer Father 75  .  Diabetes Brother     Social History   Socioeconomic History  . Marital status: Married    Spouse name: Not on file  . Number of children: Not on file  . Years of education: Not on file  . Highest education level: Not on file  Occupational History  . Not on file  Social Needs  . Financial resource strain: Not on file  . Food insecurity:    Worry: Not on file    Inability: Not on file  . Transportation needs:    Medical: Not on file    Non-medical: Not on file  Tobacco Use  . Smoking status: Never Smoker  . Smokeless tobacco: Never Used  Substance and Sexual Activity  . Alcohol use: No  . Drug use: No  . Sexual activity: Yes    Birth control/protection: Post-menopausal  Lifestyle  . Physical activity:    Days per week: Not on file    Minutes per session: Not on file  . Stress: Not on file  Relationships  . Social connections:    Talks on phone: Not on file    Gets together: Not on file    Attends religious service: Not on file    Active member of club or organization: Not on file    Attends meetings of clubs or organizations: Not on file    Relationship status: Not on file  . Intimate partner  violence:    Fear of current or ex partner: Not on file    Emotionally abused: Not on file    Physically abused: Not on file    Forced sexual activity: Not on file  Other Topics Concern  . Not on file  Social History Narrative   Married.   2 children.    Works as a Public house manager at [employer].   Enjoys gardening, Museum/gallery conservator, baking, movies.     Current Meds  Medication Sig  . aspirin EC 81 MG tablet Take 81 mg by mouth daily.  Marland Kitchen azelastine (ASTELIN) 0.1 % nasal spray Place 2 sprays into both nostrils 2 (two) times daily. Use in each nostril as directed  . calcium carbonate (OS-CAL) 600 MG TABS tablet Take 600 mg by mouth 2 (two) times daily with a meal.  . cetirizine (ZYRTEC) 10 MG tablet Take 10 mg by mouth daily.  . cholecalciferol (VITAMIN D) 1000 units tablet Take  1,000 Units by mouth daily.      ROS:  Review of Systems  Constitutional: Negative for fatigue, fever and unexpected weight change.  Respiratory: Negative for cough, shortness of breath and wheezing.   Cardiovascular: Negative for chest pain, palpitations and leg swelling.  Gastrointestinal: Negative for blood in stool, constipation, diarrhea, nausea and vomiting.  Endocrine: Negative for cold intolerance, heat intolerance and polyuria.  Genitourinary: Negative for dyspareunia, dysuria, flank pain, frequency, genital sores, hematuria, menstrual problem, pelvic pain, urgency, vaginal bleeding, vaginal discharge and vaginal pain.  Musculoskeletal: Positive for arthralgias. Negative for back pain, joint swelling and myalgias.  Skin: Negative for rash.  Neurological: Negative for dizziness, syncope, light-headedness, numbness and headaches.  Hematological: Negative for adenopathy.  Psychiatric/Behavioral: Negative for agitation, confusion, sleep disturbance and suicidal ideas. The patient is not nervous/anxious.      Objective: BP (!) 150/76   Pulse 86   Ht 4\' 11"  (1.499 m)   Wt 133 lb (60.3 kg)   BMI 26.86 kg/m    Physical Exam Constitutional:      Appearance: She is well-developed.  Genitourinary:     Vulva, vagina, cervix, uterus, right adnexa and left adnexa normal.     No vulval lesion or tenderness noted.     Vaginal atrophy present.     No vaginal discharge, erythema or tenderness.     No cervical polyp.     Uterus is not enlarged or tender.     No right or left adnexal mass present.     Right adnexa not tender.     Left adnexa not tender.  Neck:     Musculoskeletal: Normal range of motion.     Thyroid: No thyromegaly.  Cardiovascular:     Rate and Rhythm: Normal rate and regular rhythm.     Heart sounds: Normal heart sounds. No murmur.  Pulmonary:     Effort: Pulmonary effort is normal.     Breath sounds: Normal breath sounds.  Chest:     Breasts:         Right: No mass, nipple discharge, skin change or tenderness.        Left: No mass, nipple discharge, skin change or tenderness.  Abdominal:     Palpations: Abdomen is soft.     Tenderness: There is no abdominal tenderness. There is no guarding.  Musculoskeletal: Normal range of motion.  Neurological:     Mental Status: She is alert and oriented to person, place, and time.     Cranial Nerves: No cranial  nerve deficit.  Psychiatric:        Behavior: Behavior normal.  Vitals signs reviewed.    Assessment/Plan:  Encounter for annual routine gynecological examination  Cervical cancer screening - Plan: Cytology - PAP  Screening for HPV (human papillomavirus) - Plan: Cytology - PAP  Screening for breast cancer - Pt to sched mammo - Plan: MM 3D SCREEN BREAST BILATERAL  Screening for colon cancer - Pt to check with next colonoscopy due.           GYN counsel breast self exam, mammography screening, menopause, adequate intake of calcium and vitamin D, diet and exercise    F/U  Return in about 1 year (around 03/26/2019).  Kieren Adkison B. Ayonna Speranza, PA-C 03/25/2018 8:31 AM

## 2018-03-25 ENCOUNTER — Ambulatory Visit (INDEPENDENT_AMBULATORY_CARE_PROVIDER_SITE_OTHER): Payer: BLUE CROSS/BLUE SHIELD | Admitting: Obstetrics and Gynecology

## 2018-03-25 ENCOUNTER — Other Ambulatory Visit (HOSPITAL_COMMUNITY)
Admission: RE | Admit: 2018-03-25 | Discharge: 2018-03-25 | Disposition: A | Discharge status: Home / Self Care | Payer: BLUE CROSS/BLUE SHIELD | Source: Ambulatory Visit | Attending: Obstetrics and Gynecology | Admitting: Obstetrics and Gynecology

## 2018-03-25 ENCOUNTER — Encounter: Payer: Self-pay | Admitting: Obstetrics and Gynecology

## 2018-03-25 VITALS — BP 150/76 | HR 86 | Ht 59.0 in | Wt 133.0 lb

## 2018-03-25 DIAGNOSIS — Z124 Encounter for screening for malignant neoplasm of cervix: Secondary | ICD-10-CM | POA: Diagnosis not present

## 2018-03-25 DIAGNOSIS — Z1151 Encounter for screening for human papillomavirus (HPV): Secondary | ICD-10-CM | POA: Insufficient documentation

## 2018-03-25 DIAGNOSIS — Z01419 Encounter for gynecological examination (general) (routine) without abnormal findings: Secondary | ICD-10-CM | POA: Diagnosis not present

## 2018-03-25 DIAGNOSIS — Z1211 Encounter for screening for malignant neoplasm of colon: Secondary | ICD-10-CM

## 2018-03-25 DIAGNOSIS — Z1239 Encounter for other screening for malignant neoplasm of breast: Secondary | ICD-10-CM

## 2018-03-25 NOTE — Patient Instructions (Signed)
I value your feedback and entrusting us with your care. If you get a [facility] patient survey, I would appreciate you taking the time to let us know about your experience today. Thank you! 

## 2018-03-26 LAB — CYTOLOGY - PAP
Diagnosis: NEGATIVE
HPV: NOT DETECTED

## 2018-03-30 ENCOUNTER — Ambulatory Visit: Payer: Self-pay | Admitting: *Deleted

## 2018-03-30 ENCOUNTER — Encounter: Payer: Self-pay | Admitting: Emergency Medicine

## 2018-03-30 ENCOUNTER — Emergency Department
Admission: EM | Admit: 2018-03-30 | Discharge: 2018-03-30 | Disposition: A | Discharge status: Home / Self Care | Payer: BLUE CROSS/BLUE SHIELD | Attending: Emergency Medicine | Admitting: Emergency Medicine

## 2018-03-30 ENCOUNTER — Emergency Department: Payer: BLUE CROSS/BLUE SHIELD

## 2018-03-30 ENCOUNTER — Other Ambulatory Visit: Payer: Self-pay

## 2018-03-30 DIAGNOSIS — R51 Headache: Secondary | ICD-10-CM | POA: Insufficient documentation

## 2018-03-30 DIAGNOSIS — R519 Headache, unspecified: Secondary | ICD-10-CM

## 2018-03-30 DIAGNOSIS — I10 Essential (primary) hypertension: Secondary | ICD-10-CM | POA: Insufficient documentation

## 2018-03-30 DIAGNOSIS — H538 Other visual disturbances: Secondary | ICD-10-CM | POA: Diagnosis not present

## 2018-03-30 DIAGNOSIS — R079 Chest pain, unspecified: Secondary | ICD-10-CM | POA: Diagnosis not present

## 2018-03-30 HISTORY — DX: Essential (primary) hypertension: I10

## 2018-03-30 LAB — URINALYSIS, COMPLETE (UACMP) WITH MICROSCOPIC
BACTERIA UA: NONE SEEN
Bilirubin Urine: NEGATIVE
Glucose, UA: NEGATIVE mg/dL
Ketones, ur: NEGATIVE mg/dL
Leukocytes,Ua: NEGATIVE
Nitrite: NEGATIVE
Protein, ur: NEGATIVE mg/dL
SQUAMOUS EPITHELIAL / LPF: NONE SEEN (ref 0–5)
Specific Gravity, Urine: 1.004 — ABNORMAL LOW (ref 1.005–1.030)
pH: 7 (ref 5.0–8.0)

## 2018-03-30 LAB — BASIC METABOLIC PANEL
Anion gap: 8 (ref 5–15)
BUN: 12 mg/dL (ref 6–20)
CO2: 27 mmol/L (ref 22–32)
Calcium: 9.7 mg/dL (ref 8.9–10.3)
Chloride: 106 mmol/L (ref 98–111)
Creatinine, Ser: 0.93 mg/dL (ref 0.44–1.00)
GFR calc Af Amer: >60 mL/min (ref >60)
GFR calc non Af Amer: >60 mL/min (ref >60)
Glucose, Bld: 112 mg/dL — ABNORMAL HIGH (ref 70–99)
Potassium: 3.8 mmol/L (ref 3.5–5.1)
Sodium: 141 mmol/L (ref 135–145)

## 2018-03-30 LAB — CBC
HCT: 41 % (ref 36.0–46.0)
Hemoglobin: 13.3 g/dL (ref 12.0–15.0)
MCH: 27.8 pg (ref 26.0–34.0)
MCHC: 32.4 g/dL (ref 30.0–36.0)
MCV: 85.8 fL (ref 80.0–100.0)
Platelets: 298 10*3/uL (ref 150–400)
RBC: 4.78 MIL/uL (ref 3.87–5.11)
RDW: 13.2 % (ref 11.5–15.5)
WBC: 4.6 10*3/uL (ref 4.0–10.5)
nRBC: 0 % (ref 0.0–0.2)

## 2018-03-30 LAB — POCT PREGNANCY, URINE: Preg Test, Ur: NEGATIVE

## 2018-03-30 LAB — TROPONIN I: Troponin I: <0.03 ng/mL (ref <0.03)

## 2018-03-30 MED ORDER — SODIUM CHLORIDE 0.9% FLUSH: 3.0000 mL | Freq: Once | INTRAVENOUS | Status: DC

## 2018-03-30 MED ORDER — HYDRALAZINE HCL 20 MG/ML IJ SOLN: 10.0000 mg | Freq: Once | INTRAMUSCULAR | Status: DC

## 2018-03-30 MED ORDER — METOPROLOL TARTRATE 25 MG PO TABS: 25.0000 mg | ORAL_TABLET | Freq: Two times a day (BID) | ORAL | 0 refills | Status: DC

## 2018-03-30 MED ORDER — METOPROLOL TARTRATE 5 MG/5ML IV SOLN
10.0000 mg | Freq: Once | INTRAVENOUS | Status: AC
  Administered 2018-03-30: 10 mg via INTRAVENOUS
  Filled 2018-03-30: qty 10

## 2018-03-30 NOTE — Telephone Encounter (Signed)
Patient states she took her BP last night and it was 187/86- this morning it was 173/82. Patient is complaining of fatigue, lightheadedness, and she is starting to have breathing problems. Advised patient she needs to go to ED with the symptoms she is presenting with to have her BP evaluated and to get the number down. Patient agrees to go- she will call back with her results.  Reason for Disposition . [1] Systolic BP  >= 160 OR Diastolic >= 100 AND [2] cardiac or neurologic symptoms (e.g., chest pain, difficulty breathing, unsteady gait, blurred vision)  Answer Assessment - Initial Assessment Questions 1. BLOOD PRESSURE: "What is the blood pressure?" "Did you take at least two measurements 5 minutes apart?"     173/82 2. ONSET: "When did you take your blood pressure?"     This morning 3. HOW: "How did you obtain the blood pressure?" (e.g., visiting nurse, automatic home BP monitor)     Automatic cuff 4. HISTORY: "Do you have a history of high blood pressure?"     Recently she reports she has been running higher BP 5. MEDICATIONS: "Are you taking any medications for blood pressure?" "Have you missed any doses recently?"     No medication- she did try BP medications in the past 6. OTHER SYMPTOMS: "Do you have any symptoms?" (e.g., headache, chest pain, blurred vision, difficulty breathing, weakness)     Lightheaded- feels vision is changing, fatigue,breathing changes  7. PREGNANCY: "Is there any chance you are pregnant?" "When was your last menstrual period?"     n/a  Protocols used: HIGH BLOOD PRESSURE-A-AH

## 2018-03-30 NOTE — ED Provider Notes (Signed)
[facility] Emergency Department Provider Note  ____________________________________________  Time seen: Approximately 12:53 PM  I have reviewed the triage vital signs and the nursing notes.   HISTORY  Chief Complaint Hypertension    HPI Susan Allison is a 58 y.o. female with a history of HTN not currently on any medications presenting for hypertension.  The patient reports that for the past several weeks, she has been "ignoring" symptoms of mild headache with occasional intermittent blurred vision and the sensation like she has to sometimes take a deep breath.  She says that she might have some mild chest pain but she is unsure if she might be confusing that with the arthritis she has in her shoulder.  At this time, the patient is completely asymptomatic but her blood pressure was 205/89 upon arrival.  She states that she had been on lisinopril for 30 days in the past but is not currently taking any medications for blood pressure.   Past Medical History:  Diagnosis Date  . GERD (gastroesophageal reflux disease)   . Hypertension   . Joint pain   . Mastodynia   . Sinus problem     Patient Active Problem List   Diagnosis Date Noted  . Pain of left hand 06/25/2017  . Pruritus 06/09/2017  . Chest pain 06/09/2017  . Gallstones 06/02/2017  . Liver function test abnormality 06/02/2017  . Hypertension 05/08/2017    Past Surgical History:  Procedure Laterality Date  . COLONOSCOPY  2011  . ESOPHAGOGASTRODUODENOSCOPY N/A 08/14/2014   Procedure: ESOPHAGOGASTRODUODENOSCOPY (EGD);  Surgeon: [name], MD;  Location: [facility] ENDOSCOPY;  Service: Gastroenterology;  Laterality: N/A;    Current Outpatient Rx  . Order #: [id number] Class: Historical Med  . Order #: [id number] Class: Historical Med  . Order #: [id number] Class: Historical Med  . Order #: [id number] Class: Historical Med  . Order #: [id number] Class: Historical Med    Allergies Lisinopril  Family History   Problem Relation Age of Onset  . Breast cancer Other        40's  . Hypertension Mother   . Liver disease Mother   . Prostate cancer Father 41  . Diabetes Brother     Social History Social History   Tobacco Use  . Smoking status: Never Smoker  . Smokeless tobacco: Never Used  Substance Use Topics  . Alcohol use: No  . Drug use: No    Review of Systems  Constitutional: No fever/chills.  No lightheadedness or syncope. Eyes: No visual changes. ENT: No sore throat. No congestion or rhinorrhea. Cardiovascular: Positive chest pain. Denies palpitations.  Positive hypertension. Respiratory: Denies shortness of breath.  No cough. Gastrointestinal: No abdominal pain.  No nausea, no vomiting.  No diarrhea.  No constipation. Genitourinary: Negative for dysuria. Musculoskeletal: Negative for back pain.  No lower extremity swelling or calf pain.  Positive left shoulder pain, chronic. Skin: Negative for rash. Neurological: Positive intermittent mild headaches and blurred vision, not at this time.  No numbness weakness or tingling.  No changes in speech or difficulty walking.    ____________________________________________   PHYSICAL EXAM:  VITAL SIGNS: ED Triage Vitals  Enc Vitals Group     BP 03/30/18 1052 (!) 209/89     Pulse Rate 03/30/18 1052 (!) 105     Resp 03/30/18 1052 15     Temp 03/30/18 1052 97.8 F (36.6 C)     Temp Source 03/30/18 1052 Oral     SpO2 03/30/18 1052 100 %  Weight 03/30/18 1053 131 lb (59.4 kg)     Height 03/30/18 1053  (1.499 m)     Head Circumference --      Peak Flow --      Pain Score 03/30/18 1052 3     Pain Loc --      Pain Edu? --      Excl. in GC? --     Constitutional: Alert and oriented. Answers questions appropriately. Eyes: Conjunctivae are normal.  EOMI. No scleral icterus. Head: Atraumatic. Nose: No congestion/rhinnorhea. Mouth/Throat: Mucous membranes are moist.  Neck: No stridor.  Supple.  No JVD.  No  meningismus. Cardiovascular: Normal rate, regular rhythm. No murmurs, rubs or gallops.  Respiratory: Normal respiratory effort.  No accessory muscle use or retractions. Lungs CTAB.  No wheezes, rales or ronchi. Gastrointestinal: Soft, nontender and nondistended.  No guarding or rebound.  No peritoneal signs. Musculoskeletal: No LE edema. No ttp in the calves or palpable cords.  Negative Homan's sign. Neurologic:  A&Ox3.  Speech is clear.  Face and smile are symmetric.  EOMI.  Moves all extremities well. Skin:  Skin is warm, dry and intact. No rash noted. Psychiatric: Mood and affect are normal. Speech and behavior are normal.  Normal judgement  ____________________________________________   LABS (all labs ordered are listed, but only abnormal results are displayed)  Labs Reviewed  BASIC METABOLIC PANEL - Abnormal; Notable for the following components:      Result Value   Glucose, Bld 112 (*)    All other components within normal limits  CBC  TROPONIN I  URINALYSIS, COMPLETE (UACMP) WITH MICROSCOPIC  POCT PREGNANCY, URINE  POC URINE PREG, ED   ____________________________________________  EKG  ED ECG REPORT I, Anne-Caroline Sharma Covert, the attending physician, personally viewed and interpreted this ECG.   Date: 03/30/2018  EKG Time: 1054  Rate: 86  Rhythm: normal sinus rhythm  Axis: normal  Intervals:none  ST&T Change: No STEMI  ____________________________________________  RADIOLOGY  Dg Chest 2 View  Result Date: 03/30/2018 CLINICAL DATA:  Chest pain.  Hypertension. EXAM: CHEST - 2 VIEW COMPARISON:  11/11/2012 FINDINGS: The heart size and mediastinal contours are within normal limits. Both lungs are clear. The visualized skeletal structures are unremarkable. IMPRESSION: Normal exam. Electronically Signed   By: Francene Boyers M.D.   On: 03/30/2018 11:21    ____________________________________________   PROCEDURES  Procedure(s) performed: None  Procedures  Critical  Care performed: No ____________________________________________   INITIAL IMPRESSION / ASSESSMENT AND PLAN / ED COURSE  Pertinent labs & imaging results that were available during my care of the patient were reviewed by me and considered in my medical decision making (see chart for details).  58 y.o. F w/ history of hypertension not currently on any medications presenting with intermittent headaches, blurred vision, having to take deep breaths and chest pain.  Overall, the patient is significantly hypertensive here, but I do not see any focal neurologic deficits or any abnormalities on my cardiopulmonary examination.  Her laboratory studies are reassuring.  Her blood counts are normal, her creatinine is normal, and she has negative troponin.  I do not see any ischemic changes on her EKG.  Her chest x-ray shows no acute cardiopulmonary process.  I am awaiting the results of her urine to see if she has any proteinuria.  I have treated the patient with IV metoprolol and plan to discharge her home with oral medication as well as close PMD follow-up.  Return precautions were discussed.  ____________________________________________  FINAL CLINICAL IMPRESSION(S) / ED DIAGNOSES  Final diagnoses:  Hypertension, unspecified type  Blurred vision, bilateral  Nonintractable headache, unspecified chronicity pattern, unspecified headache type         NEW MEDICATIONS STARTED DURING THIS VISIT:  New Prescriptions   No medications on file      Rockne Menghini, MD 03/30/18 1307

## 2018-03-30 NOTE — Discharge Instructions (Addendum)
Please start taking metoprolol, blood pressure medication.  Monitor your blood pressure once daily during a calm part of your day and bring the record with you to your primary care physician's office so your medication can be adjusted as needed.  Turn to the emergency department if you develop severe pain, lightheadedness or fainting, fever, chest pain or shortness of breath, palpitations, or any other symptoms concerning to you.

## 2018-03-30 NOTE — Telephone Encounter (Signed)
Per chart review tab pt is at [facility] ED. 

## 2018-03-30 NOTE — Telephone Encounter (Signed)
Looks like pt was admitted to ED today.

## 2018-03-30 NOTE — Telephone Encounter (Signed)
Noted, patient currently under evaluation in the emergency department.

## 2018-03-30 NOTE — ED Triage Notes (Signed)
Patient presents to ED via POV from home with c/o hypertension. Patient is unable to give a clear picture at this time in regards to timeline of events. Patient states "I've been putting it off for a while. Its been going on for a long time". Patient reports headache and chest discomfort. Patient is unable to state when symptoms began.

## 2018-03-30 NOTE — Telephone Encounter (Signed)
Hi Susan Allison- patient is transitioning care- I triaged her and advised ED due to her symptoms. I hope that was not an overstep- I planned to call you if the disposition was for her to be seen in office- but it was ED. Was not sure where to direct call afterward. Sorry for the confusion

## 2018-03-30 NOTE — ED Notes (Signed)
Upon further questioning and looking at patients chart patient was on lisinopril but taken off due to a reaction (hives). Patient denies being prescribed anything else in place of it.

## 2018-03-30 NOTE — ED Notes (Signed)
Patient discharged from ED, medications and discharge instructions reviewed, questions answered.

## 2018-04-01 ENCOUNTER — Encounter: Payer: Self-pay | Admitting: Nurse Practitioner

## 2018-04-01 ENCOUNTER — Other Ambulatory Visit: Payer: Self-pay

## 2018-04-01 ENCOUNTER — Ambulatory Visit: Payer: BLUE CROSS/BLUE SHIELD | Admitting: Nurse Practitioner

## 2018-04-01 VITALS — BP 162/80 | HR 54 | Temp 97.6°F | Resp 18 | Ht 60.0 in | Wt 131.0 lb

## 2018-04-01 DIAGNOSIS — H65193 Other acute nonsuppurative otitis media, bilateral: Secondary | ICD-10-CM

## 2018-04-01 DIAGNOSIS — I10 Essential (primary) hypertension: Secondary | ICD-10-CM

## 2018-04-01 MED ORDER — HYDROCHLOROTHIAZIDE 25 MG PO TABS: 25.0000 mg | ORAL_TABLET | Freq: Every day | ORAL | 0 refills | Status: DC

## 2018-04-01 NOTE — Addendum Note (Signed)
Addended by: Tresea Mall on: 04/01/2018 12:54 PM   Modules accepted: Level of Service

## 2018-04-01 NOTE — Progress Notes (Addendum)
   Subjective:    Patient ID: Susan Allison, female    DOB: 1960/07/18, 58 y.o.   MRN: [id number]  HPI Susan Allison is here requesting blood pressure check after ED visit 03/30/2018 which she was seen d/t blurred vision, headache and uncontrolled blood pressure. All labs and imaging reviewed and were unremarkable. She has been off b/p meds x 6 months and transitioning care to new PCP with appt 3/26 to Dr. Floyce Stakes. She has been put on Metoprolol 25 mg PO BID and brings b/p log in today with HR in 46-52 range. She denies fatigue but reports continued h/a, blurred vision. She does reports she plans to get her eyes examined as she wears corrective lenses and is due for 2 year check. Meds reviewed and taking as directed. Reports FH: mom; deceased nonalcoholic cirrhosis and HTN, Father: alive and healthy, children: alive and healthy  Review of Systems  Eyes: Positive for visual disturbance.  Respiratory: Negative for shortness of breath.   Cardiovascular: Negative for chest pain.  Neurological: Positive for headaches.       Objective:   Physical Exam Vitals signs reviewed.  Constitutional:      Appearance: Normal appearance. She is well-developed.  HENT:     Head: Normocephalic and atraumatic.     Comments: Bilateral serous fluid with no erythema    Right Ear: Ear canal normal.     Left Ear: Ear canal normal.     Nose: Nose normal.     Mouth/Throat:     Mouth: Mucous membranes are moist.  Neck:     Musculoskeletal: Normal range of motion and neck supple.  Cardiovascular:     Rate and Rhythm: Normal rate and regular rhythm.     Heart sounds: Normal heart sounds. No murmur.  Pulmonary:     Effort: Pulmonary effort is normal. No respiratory distress.     Breath sounds: Normal breath sounds. No wheezing or rhonchi.  Musculoskeletal: Normal range of motion.  Skin:    General: Skin is warm and dry.  Neurological:     Mental Status: She is alert and oriented to person, place, and time.   Psychiatric:        Mood and Affect: Mood normal.           Assessment & Plan:

## 2018-04-01 NOTE — Patient Instructions (Addendum)
Stop Metoprolol and start new med as directed Monitor blood pressure twice daily 3-4 times a week Call me next week if blood pressure not trending down Continue Claritin for allergies Work on cutting back on salt If you develop increased dizziness, blurred vision or chest pain go to urgent or ED Return to clinic in 2 weeks for follow up (will check CMP, urine micro at next OV)

## 2018-04-07 ENCOUNTER — Telehealth: Payer: Self-pay | Admitting: Nurse Practitioner

## 2018-04-07 NOTE — Telephone Encounter (Signed)
Spoke with Susan Allison this morning as she sent b/p readings in over the last week. No med adjustment will be made at this time as readings are trending down and noted a difference with pre and post med administration. She will continue to log her b/p and will re-evaluate next week. Verbalized understanding.

## 2018-04-15 ENCOUNTER — Ambulatory Visit: Payer: BLUE CROSS/BLUE SHIELD | Admitting: Medical

## 2018-04-15 ENCOUNTER — Ambulatory Visit: Payer: BLUE CROSS/BLUE SHIELD | Admitting: Nurse Practitioner

## 2018-04-22 ENCOUNTER — Other Ambulatory Visit: Payer: Self-pay

## 2018-04-22 ENCOUNTER — Ambulatory Visit (INDEPENDENT_AMBULATORY_CARE_PROVIDER_SITE_OTHER): Payer: BLUE CROSS/BLUE SHIELD | Admitting: Internal Medicine

## 2018-04-22 ENCOUNTER — Encounter: Payer: BLUE CROSS/BLUE SHIELD | Admitting: Internal Medicine

## 2018-04-22 ENCOUNTER — Encounter: Payer: Self-pay | Admitting: Internal Medicine

## 2018-04-22 VITALS — BP 152/82 | HR 97 | Temp 98.7°F | Ht 60.0 in | Wt 131.4 lb

## 2018-04-22 DIAGNOSIS — Z1329 Encounter for screening for other suspected endocrine disorder: Secondary | ICD-10-CM

## 2018-04-22 DIAGNOSIS — I10 Essential (primary) hypertension: Secondary | ICD-10-CM

## 2018-04-22 DIAGNOSIS — M25542 Pain in joints of left hand: Secondary | ICD-10-CM | POA: Diagnosis not present

## 2018-04-22 DIAGNOSIS — R945 Abnormal results of liver function studies: Secondary | ICD-10-CM | POA: Diagnosis not present

## 2018-04-22 DIAGNOSIS — R7989 Other specified abnormal findings of blood chemistry: Secondary | ICD-10-CM

## 2018-04-22 DIAGNOSIS — Z1231 Encounter for screening mammogram for malignant neoplasm of breast: Secondary | ICD-10-CM | POA: Diagnosis not present

## 2018-04-22 DIAGNOSIS — G5602 Carpal tunnel syndrome, left upper limb: Secondary | ICD-10-CM

## 2018-04-22 DIAGNOSIS — R319 Hematuria, unspecified: Secondary | ICD-10-CM

## 2018-04-22 MED ORDER — HYDROCHLOROTHIAZIDE 25 MG PO TABS: 25.0000 mg | ORAL_TABLET | Freq: Every day | ORAL | 3 refills | Status: DC

## 2018-04-22 MED ORDER — AMLODIPINE BESYLATE 2.5 MG PO TABS: 2.5000 mg | ORAL_TABLET | Freq: Every day | ORAL | 3 refills | Status: DC

## 2018-04-22 NOTE — Patient Instructions (Addendum)
Shingles vaccine x 2 doses   Recombinant Zoster (Shingles) Vaccine, RZV: What You Need to Know 1. Why get vaccinated? Shingles (also called herpes zoster, or just zoster) is a painful skin rash, often with blisters. Shingles is caused by the varicella zoster virus, the same virus that causes chickenpox. After you have chickenpox, the virus stays in your body and can cause shingles later in life. You can't catch shingles from another person. However, a person who has never had chickenpox (or chickenpox vaccine) could get chickenpox from someone with shingles. A shingles rash usually appears on one side of the face or body and heals within 2 to 4 weeks. Its main symptom is pain, which can be severe. Other symptoms can include fever, headache, chills, and upset stomach. Very rarely, a shingles infection can lead to pneumonia, hearing problems, blindness, brain inflammation (encephalitis), or death. For about 1 person in 5, severe pain can continue even long after the rash has cleared up. This long-lasting pain is called post-herpetic neuralgia (PHN). Shingles is far more common in people 56 years of age and older than in younger people, and the risk increases with age. It is also more common in people whose immune system is weakened because of a disease such as cancer, or by drugs such as steroids or chemotherapy. At least 1 million people a year in the [place] get shingles. 2. Shingles vaccine (recombinant) Recombinant shingles vaccine was approved by FDA in 2017 for the prevention of shingles. In clinical trials, it was more than 90% effective in preventing shingles. It can also reduce the likelihood of PHN. Two doses, 2 to 6 months apart, are recommended for adults 50 and older. This vaccine is also recommended for people who have already gotten the live shingles vaccine (Zostavax). There is no live virus in this vaccine. 3. Some people should not get this vaccine Tell your vaccine provider if  you:  Have any severe, life-threatening allergies. A person who has ever had a life-threatening allergic reaction after a dose of recombinant shingles vaccine, or has a severe allergy to any component of this vaccine, may be advised not to be vaccinated. Ask your health care provider if you want information about vaccine components.  Are pregnant or breastfeeding. There is not much information about use of recombinant shingles vaccine in pregnant or nursing women. Your healthcare provider might recommend delaying vaccination.  Are not feeling well. If you have a mild illness, such as a cold, you can probably get the vaccine today. If you are moderately or severely ill, you should probably wait until you recover. Your doctor can advise you. 4. Risks of a vaccine reaction With any medicine, including vaccines, there is a chance of reactions. After recombinant shingles vaccination, a person might experience:  Pain, redness, soreness, or swelling at the site of the injection  Headache, muscle aches, fever, shivering, fatigue In clinical trials, most people got a sore arm with mild or moderate pain after vaccination, and some also had redness and swelling where they got the shot. Some people felt tired, had muscle pain, a headache, shivering, fever, stomach pain, or nausea. About 1 out of 6 people who got recombinant zoster vaccine experienced side effects that prevented them from doing regular activities. Symptoms went away on their own in about 2 to 3 days. Side effects were more common in younger people. You should still get the second dose of recombinant zoster vaccine even if you had one of these reactions after the  first dose. Other things that could happen after this vaccine:  People sometimes faint after medical procedures, including vaccination. Sitting or lying down for about 15 minutes can help prevent fainting and injuries caused by a fall. Tell your provider if you feel dizzy or have vision  changes or ringing in the ears.  Some people get shoulder pain that can be more severe and longer-lasting than routine soreness that can follow injections. This happens very rarely.  Any medication can cause a severe allergic reaction. Such reactions to a vaccine are estimated at about 1 in a million doses, and would happen within a few minutes to a few hours after the vaccination. As with any medicine, there is a very remote chance of a vaccine causing a serious injury or death. The safety of vaccines is always being monitored. For more information, visit: http://floyd.org/ 5. What if there is a serious problem? What should I look for?  Look for anything that concerns you, such as signs of a severe allergic reaction, very high fever, or unusual behavior. Signs of a severe allergic reaction can include hives, swelling of the face and throat, difficulty breathing, a fast heartbeat, dizziness, and weakness. These would usually start a few minutes to a few hours after the vaccination. What should I do?  If you think it is a severe allergic reaction or other emergency that can't wait, call 9-1-1 or get to the nearest hospital. Otherwise, call your health care provider. Afterward, the reaction should be reported to the Vaccine Adverse Event Reporting System (VAERS). Your doctor should file this report, or you can do it yourself through the VAERS website at www.vaers.LAgents.no, or by calling 1-(506)033-3859. VAERS does not give medical advice. 6. How can I learn more?  Ask your health care provider. He or she can give you the vaccine package insert or suggest other sources of information.  Call your local or state health department.  Contact the Centers for Disease Control and Prevention (CDC): ? Call (386) 209-3621 (1-800-CDC-INFO) or ? Visit CDC's vaccines website at PicCapture.uy CDC Vaccine Information Statement Recombinant Zoster Vaccine (03/10/2016) This information is not  intended to replace advice given to you by your health care provider. Make sure you discuss any questions you have with your health care provider. Document Released: 03/25/2016 Document Revised: 08/19/2017 Document Reviewed: 08/19/2017 Elsevier Interactive Patient Education  2019 Elsevier Inc.   Carpal Tunnel Syndrome  Carpal tunnel syndrome is a condition that causes pain in your hand and arm. The carpal tunnel is a narrow area located on the palm side of your wrist. Repeated wrist motion or certain diseases may cause swelling within the tunnel. This swelling pinches the main nerve in the wrist (median nerve). What are the causes? This condition may be caused by:  Repeated wrist motions.  Wrist injuries.  Arthritis.  A cyst or tumor in the carpal tunnel.  Fluid buildup during pregnancy. Sometimes the cause of this condition is not known. What increases the risk? The following factors may make you more likely to develop this condition:  Having a job, such as being a Haematologist, that requires you to repeatedly move your wrist in the same motion.  Being a woman.  Having certain conditions, such as: ? Diabetes. ? Obesity. ? An underactive thyroid (hypothyroidism). ? Kidney failure. What are the signs or symptoms? Symptoms of this condition include:  A tingling feeling in your fingers, especially in your thumb, index, and middle fingers.  Tingling or numbness in  your hand.  An aching feeling in your entire arm, especially when your wrist and elbow are bent for a long time.  Wrist pain that goes up your arm to your shoulder.  Pain that goes down into your palm or fingers.  A weak feeling in your hands. You may have trouble grabbing and holding items. Your symptoms may feel worse during the night. How is this diagnosed? This condition is diagnosed with a medical history and physical exam. You may also have tests, including:  Electromyogram (EMG). This test  measures electrical signals sent by your nerves into the muscles.  Nerve conduction study. This test measures how well electrical signals pass through your nerves.  Imaging tests, such as X-rays, ultrasound, and MRI. These tests check for possible causes of your condition. How is this treated? This condition may be treated with:  Lifestyle changes. It is important to stop or change the activity that caused your condition.  Doing exercise and activities to strengthen your muscles and bones (physical therapy).  Learning how to use your hand again after diagnosis (occupational therapy).  Medicines for pain and inflammation. This may include medicine that is injected into your wrist.  A wrist splint.  Surgery. Follow these instructions at home: If you have a splint:  Wear the splint as told by your health care provider. Remove it only as told by your health care provider.  Loosen the splint if your fingers tingle, become numb, or turn cold and blue.  Keep the splint clean.  If the splint is not waterproof: ? Do not let it get wet. ? Cover it with a watertight covering when you take a bath or shower. Managing pain, stiffness, and swelling   If directed, put ice on the painful area: ? If you have a removable splint, remove it as told by your health care provider. ? Put ice in a plastic bag. ? Place a towel between your skin and the bag. ? Leave the ice on for 20 minutes, 2-3 times per day. General instructions  Take over-the-counter and prescription medicines only as told by your health care provider.  Rest your wrist from any activity that may be causing your pain. If your condition is work related, talk with your employer about changes that can be made, such as getting a wrist pad to use while typing.  Do any exercises as told by your health care provider, physical therapist, or occupational therapist.  Keep all follow-up visits as told by your health care provider. This is  important. Contact a health care provider if:  You have new symptoms.  Your pain is not controlled with medicines.  Your symptoms get worse. Get help right away if:  You have severe numbness or tingling in your wrist or hand. Summary  Carpal tunnel syndrome is a condition that causes pain in your hand and arm.  It is usually caused by repeated wrist motions.  Lifestyle changes and medicines are used to treat carpal tunnel syndrome. Surgery may be recommended.  Follow your health care provider's instructions about wearing a splint, resting from activity, keeping follow-up visits, and calling for help. This information is not intended to replace advice given to you by your health care provider. Make sure you discuss any questions you have with your health care provider. Document Released: 01/11/2000 Document Revised: 05/22/2017 Document Reviewed: 05/22/2017 Elsevier Interactive Patient Education  2019 [employer].

## 2018-04-22 NOTE — Progress Notes (Signed)
Pre visit review using our clinic review tool, if applicable. No additional management support is needed unless otherwise documented below in the visit note. 

## 2018-04-22 NOTE — Progress Notes (Signed)
Chief Complaint  Patient presents with  . Follow-up  . Transitions Of Care   TOC 1. HTN uncontrolled on hctz 25 mg qam BP om a, running 1 teens to 200s/70s-80s she did have ED visit 03/30/2018 with BP >200 and was started on lopressor 25 mg bid but HR was going down to the 40s. BP is raising at night per pt and log and feels lightheaded   2. C/o tingling in left hand and forearm at times denies neck pain 3. C/o joint pain in hands FH RA sister and lupus in m aunt  4. H/o elevated lfts while she was on lisinopril in the past    Review of Systems  Constitutional: Negative for weight loss.  HENT: Negative for hearing loss.   Eyes: Negative for blurred vision.  Respiratory: Negative for shortness of breath.   Cardiovascular: Negative for chest pain.  Gastrointestinal: Negative for abdominal pain.  Musculoskeletal: Positive for joint pain.  Skin: Negative for rash.  Neurological: Positive for tingling.  Psychiatric/Behavioral: Negative for depression.   Past Medical History:  Diagnosis Date  . Arthritis   . GERD (gastroesophageal reflux disease)   . Hypertension   . Joint pain   . Mastodynia   . Sinus problem    Past Surgical History:  Procedure Laterality Date  . COLONOSCOPY  2011  . ESOPHAGOGASTRODUODENOSCOPY N/A 08/14/2014   Procedure: ESOPHAGOGASTRODUODENOSCOPY (EGD);  Surgeon: [name], MD;  [facility];  Service: Gastroenterology;  Laterality: N/A;   Family History  Problem Relation Age of Onset  . Breast cancer Other        40's  . Hypertension Mother   . Liver disease Mother   . Prostate cancer Father 80  . Diabetes Brother   . Arthritis/Rheumatoid Sister   . Lupus Maternal Aunt    Social History   Socioeconomic History  . Marital status: Married    Spouse name: Not on file  . Number of children: Not on file  . Years of education: Not on file  . Highest education level: Not on file  Occupational History  . Not on file  Social Needs  .  Financial resource strain: Not on file  . Food insecurity:    Worry: Not on file    Inability: Not on file  . Transportation needs:    Medical: Not on file    Non-medical: Not on file  Tobacco Use  . Smoking status: Never Smoker  . Smokeless tobacco: Never Used  Substance and Sexual Activity  . Alcohol use: No  . Drug use: No  . Sexual activity: Yes    Birth control/protection: Post-menopausal  Lifestyle  . Physical activity:    Days per week: Not on file    Minutes per session: Not on file  . Stress: Not on file  Relationships  . Social connections:    Talks on phone: Not on file    Gets together: Not on file    Attends religious service: Not on file    Active member of club or organization: Not on file    Attends meetings of clubs or organizations: Not on file    Relationship status: Not on file  . Intimate partner violence:    Fear of current or ex partner: Not on file    Emotionally abused: Not on file    Physically abused: Not on file    Forced sexual activity: Not on file  Other Topics Concern  . Not on file  Social  History Narrative   Married.   2 children. 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    Current Meds  Medication Sig  . aspirin EC 81 MG tablet Take 81 mg by mouth daily.  Marland Kitchen azelastine (ASTELIN) 0.1 % nasal spray Place 2 sprays into both nostrils 2 (two) times daily. Use in each nostril as directed  . calcium carbonate (OS-CAL) 600 MG TABS tablet Take 600 mg by mouth 2 (two) times daily with a meal.  . cetirizine (ZYRTEC) 10 MG tablet Take 10 mg by mouth daily.  . cholecalciferol (VITAMIN D) 1000 units tablet Take 1,000 Units by mouth daily.  . hydrochlorothiazide (HYDRODIURIL) 25 MG tablet Take 1 tablet (25 mg total) by mouth daily. In the am  . [DISCONTINUED] hydrochlorothiazide (HYDRODIURIL) 25 MG tablet Take 1 tablet (25 mg total) by mouth daily.   Allergies   Allergen Reactions  . Lisinopril     Possible cause of itching/ elevated liver enzymes - discontinued 06/03/17.  Cough    Recent Results (from the past 2160 hour(s))  Cytology - PAP     Status: None   Collection Time: 03/25/18 12:00 AM  Result Value Ref Range   Adequacy      Satisfactory for evaluation  endocervical/transformation zone component PRESENT.   Diagnosis      NEGATIVE FOR INTRAEPITHELIAL LESIONS OR MALIGNANCY.   HPV NOT DETECTED     Comment: Normal Reference Range - NOT Detected   Material Submitted CervicoVaginal Pap [ThinPrep Imaged]   Basic metabolic panel     Status: Abnormal   Collection Time: 03/30/18 10:58 AM  Result Value Ref Range   Sodium 141 135 - 145 mmol/L   Potassium 3.8 3.5 - 5.1 mmol/L   Chloride 106 98 - 111 mmol/L   CO2 27 22 - 32 mmol/L   Glucose, Bld 112 (H) 70 - 99 mg/dL   BUN 12 6 - 20 mg/dL   Creatinine, Ser 4.03 0.44 - 1.00 mg/dL   Calcium 9.7 8.9 - 47.4 mg/dL   GFR calc non Af Amer >60 >60 mL/min   GFR calc Af Amer >60 >60 mL/min   Anion gap 8 5 - 15    Comment: Performed at [facility], [address] [place], [place] [address]  CBC     Status: None   Collection Time: 03/30/18 10:58 AM  Result Value Ref Range   WBC 4.6 4.0 - 10.5 K/uL   RBC 4.78 3.87 - 5.11 MIL/uL   Hemoglobin 13.3 12.0 - 15.0 g/dL   HCT 38.7 56.4 - 33.2 %   MCV 85.8 80.0 - 100.0 fL   MCH 27.8 26.0 - 34.0 pg   MCHC 32.4 30.0 - 36.0 g/dL   RDW 95.1 88.4 - 16.6 %   Platelets 298 150 - 400 K/uL   nRBC 0.0 0.0 - 0.2 %    Comment: Performed at [facility], [address] [place], [place] [address]  Troponin I - ONCE - STAT     Status: None   Collection Time: 03/30/18 10:58 AM  Result Value Ref Range   Troponin I <0.03 <0.03 ng/mL    Comment: Performed at [facility], [address] [place], [place] [address]  Urinalysis, Complete w Microscopic     Status: Abnormal   Collection Time: 03/30/18 12:57 PM  Result Value Ref Range    Color, Urine COLORLESS (  A) YELLOW   APPearance CLEAR (A) CLEAR   Specific Gravity, Urine 1.004 (L) 1.005 - 1.030   pH 7.0 5.0 - 8.0   Glucose, UA NEGATIVE NEGATIVE mg/dL   Hgb urine dipstick SMALL (A) NEGATIVE   Bilirubin Urine NEGATIVE NEGATIVE   Ketones, ur NEGATIVE NEGATIVE mg/dL   Protein, ur NEGATIVE NEGATIVE mg/dL   Nitrite NEGATIVE NEGATIVE   Leukocytes,Ua NEGATIVE NEGATIVE   RBC / HPF 0-5 0 - 5 RBC/hpf   WBC, UA 0-5 0 - 5 WBC/hpf   Bacteria, UA NONE SEEN NONE SEEN   Squamous Epithelial / LPF NONE SEEN 0 - 5    Comment: Performed at [facility], [address] [place], [place] [address]  Pregnancy, urine POC     Status: None   Collection Time: 03/30/18  1:05 PM  Result Value Ref Range   Preg Test, Ur NEGATIVE NEGATIVE    Comment:        THE SENSITIVITY OF THIS METHODOLOGY IS >24 mIU/mL    Objective  Body mass index is 25.66 kg/m. Wt Readings from Last 3 Encounters:  04/22/18 131 lb 6.4 oz (59.6 kg)  04/01/18 131 lb (59.4 kg)  03/30/18 131 lb (59.4 kg)   Temp Readings from Last 3 Encounters:  04/22/18 98.7 F (37.1 C) (Oral)  04/01/18 97.6 F (36.4 C)  03/30/18 97.8 F (36.6 C) (Oral)   BP Readings from Last 3 Encounters:  04/22/18 (!) 152/82  04/01/18 (!) 162/80  03/30/18 (!) 187/83   Pulse Readings from Last 3 Encounters:  04/22/18 97  04/01/18 (!) 54  03/30/18 65    Physical Exam Vitals signs and nursing note reviewed.  Constitutional:      Appearance: Normal appearance. She is well-developed and well-groomed.  HENT:     Head: Normocephalic and atraumatic.     Nose: Nose normal.     Mouth/Throat:     Mouth: Mucous membranes are moist.     Pharynx: Oropharynx is clear.  Eyes:     Conjunctiva/sclera: Conjunctivae normal.     Pupils: Pupils are equal, round, and reactive to light.  Cardiovascular:     Rate and Rhythm: Normal rate and regular rhythm.     Heart sounds: Normal heart sounds. No murmur.  Pulmonary:     Effort:  Pulmonary effort is normal.     Breath sounds: Normal breath sounds.  Skin:    General: Skin is warm and dry.  Neurological:     General: No focal deficit present.     Mental Status: She is alert and oriented to person, place, and time. Mental status is at baseline.     Gait: Gait normal.  Psychiatric:        Attention and Perception: Attention and perception normal.        Mood and Affect: Mood and affect normal.        Speech: Speech normal.        Behavior: Behavior normal. Behavior is cooperative.        Thought Content: Thought content normal.        Cognition and Memory: Cognition and memory normal.        Judgment: Judgment normal.     Assessment   1. HTN  2. ? CTS left hand/wrist with joint pain/arthrlagia  3. Elevated lfts  4. Hematuria w/o UTI sxs 5. HM Plan  1. Check CMET today  Add norvasc 2.5 qhs and hctz 25 qam  Need to check fasting lipid in  future  Stopped lopressor 25 mg bid due to bradycardia  My chart in 2 weeks re BP   2. rx brace if continues refer hand ortho  3. Check cmet today  W/u autoimmune. She does have h/o GS noted [place] 05/27/17  For 2 and 3 labs today to w/u with FH + lupus and RA r/o with joint pain  4. UA and culture today  5.  utd flu shot  Check ncir Tdap  Disc shingrix today   Hep C negative  Declines HIV  Pap 03/25/2018 neg pap neg HPV  mammo due 05/01/2018 ordered today  Get copy of colonoscopy 2016 can see EGD only [facility]  DEXA 03/13/10 normal do another age 7  Provider: Dr. French Ana McLean-Scocuzza-Internal Medicine

## 2018-04-22 NOTE — Progress Notes (Signed)
tdap has been added to chart

## 2018-04-23 LAB — URINALYSIS, ROUTINE W REFLEX MICROSCOPIC
Bilirubin, UA: NEGATIVE
Glucose, UA: NEGATIVE
Ketones, UA: NEGATIVE
LEUKOCYTES UA: NEGATIVE
Nitrite, UA: NEGATIVE
Protein, UA: NEGATIVE
RBC, UA: NEGATIVE
SPEC GRAV UA: 1.008 (ref 1.005–1.030)
Urobilinogen, Ur: 0.2 mg/dL (ref 0.2–1.0)
pH, UA: 7.5 (ref 5.0–7.5)

## 2018-04-24 LAB — COMPREHENSIVE METABOLIC PANEL
ALT: 19 IU/L (ref 0–32)
AST: 20 IU/L (ref 0–40)
Albumin/Globulin Ratio: 1.9 (ref 1.2–2.2)
Albumin: 4.8 g/dL (ref 3.8–4.9)
Alkaline Phosphatase: 117 IU/L (ref 39–117)
BUN/Creatinine Ratio: 12 (ref 9–23)
BUN: 14 mg/dL (ref 6–24)
Bilirubin Total: <0.2 mg/dL (ref 0.0–1.2)
CALCIUM: 10.1 mg/dL (ref 8.7–10.2)
CO2: 28 mmol/L (ref 20–29)
Chloride: 98 mmol/L (ref 96–106)
Creatinine, Ser: 1.13 mg/dL — ABNORMAL HIGH (ref 0.57–1.00)
GFR calc Af Amer: 61 mL/min/{1.73_m2} (ref >59)
GFR calc non Af Amer: 53 mL/min/{1.73_m2} — ABNORMAL LOW (ref >59)
Globulin, Total: 2.5 g/dL (ref 1.5–4.5)
Glucose: 103 mg/dL — ABNORMAL HIGH (ref 65–99)
Potassium: 4.5 mmol/L (ref 3.5–5.2)
SODIUM: 143 mmol/L (ref 134–144)
Total Protein: 7.3 g/dL (ref 6.0–8.5)

## 2018-04-24 LAB — SEDIMENTATION RATE: Sed Rate: 33 mm/hr (ref 0–40)

## 2018-04-24 LAB — RHEUMATOID FACTOR: Rheumatoid fact SerPl-aCnc: <10 IU/mL (ref 0.0–13.9)

## 2018-04-24 LAB — ANA: Anti Nuclear Antibody (ANA): NEGATIVE

## 2018-04-24 LAB — URINE CULTURE

## 2018-04-24 LAB — TSH: TSH: 2.55 u[IU]/mL (ref 0.450–4.500)

## 2018-04-24 LAB — CYCLIC CITRUL PEPTIDE ANTIBODY, IGG/IGA: Cyclic Citrullin Peptide Ab: 10 units (ref 0–19)

## 2018-04-24 LAB — C-REACTIVE PROTEIN: CRP: 2 mg/L (ref 0–10)

## 2018-04-27 ENCOUNTER — Telehealth: Payer: Self-pay | Admitting: Internal Medicine

## 2018-04-27 NOTE — Telephone Encounter (Signed)
Patient was informed.  Patient understood and no questions, comments, or concerns at this time.  

## 2018-04-27 NOTE — Telephone Encounter (Signed)
Inform pt 08/14/2014 they did not do colonoscopy only upper endoscopy [facility])  into her stomach and stretched her stomach.   She will need a colonoscopy in the future for now this is not urgent with everything going on     TMS

## 2018-05-05 ENCOUNTER — Telehealth: Payer: Self-pay | Admitting: Internal Medicine

## 2018-05-05 NOTE — Telephone Encounter (Signed)
Please sch telephone f/u by 05/23/2018 f/u blood pressure telephone visit  Thanks TMS

## 2018-05-10 ENCOUNTER — Encounter: Payer: Self-pay | Admitting: Internal Medicine

## 2018-05-18 NOTE — Telephone Encounter (Signed)
Pt has been scheduled. Thank you.

## 2018-05-19 ENCOUNTER — Ambulatory Visit (INDEPENDENT_AMBULATORY_CARE_PROVIDER_SITE_OTHER): Payer: BLUE CROSS/BLUE SHIELD | Admitting: Internal Medicine

## 2018-05-19 DIAGNOSIS — M545 Low back pain, unspecified: Secondary | ICD-10-CM | POA: Insufficient documentation

## 2018-05-19 DIAGNOSIS — G8929 Other chronic pain: Secondary | ICD-10-CM | POA: Diagnosis not present

## 2018-05-19 DIAGNOSIS — R739 Hyperglycemia, unspecified: Secondary | ICD-10-CM

## 2018-05-19 DIAGNOSIS — I10 Essential (primary) hypertension: Secondary | ICD-10-CM | POA: Diagnosis not present

## 2018-05-19 NOTE — Progress Notes (Signed)
Telephone Note  I connected with Susan Allison  on 05/19/18 at  8:15 AM EDT by telephone and verified that I am speaking with the correct person using two identifiers.  Location patient: home Location provider:work or home office Persons participating in the virtual visit: patient, provider  I discussed the limitations of evaluation and management by telemedicine and the availability of in person appointments. The patient expressed understanding and agreed to proceed.   HPI:  1. HTN BP trend improved hctz 25 mg qd and norvasc 2.5 mg qd she reports looking down she gets lightheaded and has eye appt 06/2017 she has bifocals. Sat BP was 113/69 overall trend lower 1x 144/76 ~ 10 am then ~ 1 pm 128/78 she takes meds around 7:30/8 am each morning  2. X 2 weeks c/o low back pain and legs feel like they are going to give out at times   ROS: See pertinent positives and negatives per HPI.  Past Medical History:  Diagnosis Date  . Arthritis   . GERD (gastroesophageal reflux disease)   . Hypertension   . Joint pain   . Mastodynia   . Sinus problem     Past Surgical History:  Procedure Laterality Date  . COLONOSCOPY  2011  . ESOPHAGOGASTRODUODENOSCOPY N/A 08/14/2014   Procedure: ESOPHAGOGASTRODUODENOSCOPY (EGD);  Surgeon: [name], MD;  Location[facility] ENDOSCOPY;  Service: Gastroenterology;  Laterality: N/A;    Family History  Problem Relation Age of Onset  . Breast cancer Other        40's  . Hypertension Mother   . Liver disease Mother   . Prostate cancer Father 56  . Diabetes Brother   . Arthritis/Rheumatoid Sister   . Lupus Maternal Aunt     SOCIAL HX: married lives at home   Current Outpatient Medications:  .  amLODipine (NORVASC) 2.5 MG tablet, Take 1 tablet (2.5 mg total) by mouth daily at 6 PM., Disp: 90 tablet, Rfl: 3 .  aspirin EC 81 MG tablet, Take 81 mg by mouth daily., Disp: , Rfl:  .  azelastine (ASTELIN) 0.1 % nasal spray, Place 2 sprays into both nostrils 2 (two)  times daily. Use in each nostril as directed, Disp: , Rfl:  .  calcium carbonate (OS-CAL) 600 MG TABS tablet, Take 600 mg by mouth 2 (two) times daily with a meal., Disp: , Rfl:  .  cetirizine (ZYRTEC) 10 MG tablet, Take 10 mg by mouth daily., Disp: , Rfl:  .  cholecalciferol (VITAMIN D) 1000 units tablet, Take 1,000 Units by mouth daily., Disp: , Rfl:  .  hydrochlorothiazide (HYDRODIURIL) 25 MG tablet, Take 1 tablet (25 mg total) by mouth daily. In the am, Disp: 90 tablet, Rfl: 3  EXAM:  VITALS per patient if applicable:  GENERAL: alert, oriented, appears well and in no acute distress  PSYCH/NEURO: pleasant and cooperative, no obvious depression or anxiety, speech and thought processing grossly intact  ASSESSMENT AND PLAN:  Discussed the following assessment and plan:  Hypertension, unspecified type  BP improved  Check fasting labs in 3 months  Encouraged water intake  Cont meds   Low back pain  -will ordered Xray low back in future   HM utd flu shot  Tdap due  Disc shingrix today   Hep C negative  Declines HIV  Pap 03/25/2018 neg pap neg HPV  mammo due 05/01/2018 ordered sch 06/30/2018  Get copy of colonoscopy 2016 can see EGD only [facility]  DEXA 03/13/10 normal do another age  63    I discussed the assessment and treatment plan with the patient. The patient was provided an opportunity to ask questions and all were answered. The patient agreed with the plan and demonstrated an understanding of the instructions.   The patient was advised to call back or seek an in-person evaluation if the symptoms worsen or if the condition fails to improve as anticipated.  Time 15 minutes  Bevelyn Bucklesracy N McLean-Scocuzza, MD

## 2018-06-23 ENCOUNTER — Encounter: Payer: BLUE CROSS/BLUE SHIELD | Admitting: Internal Medicine

## 2018-06-25 ENCOUNTER — Telehealth: Payer: Self-pay

## 2018-06-25 NOTE — Telephone Encounter (Signed)
Called pt to get a little more information and to advise [facility] out of office until Monday, no answer, LVMTRC.

## 2018-06-25 NOTE — Telephone Encounter (Signed)
Pt called triage line stating she had a pap smear with Dr. Patsy Lager in February. She has a question about the HPV. CB# [phone]

## 2018-06-25 NOTE — Telephone Encounter (Signed)
Apparently the question the pt had was already taken care of yesterday when she spoke to Mrs Harriett Sine per pt. It was a billing question.

## 2018-07-23 ENCOUNTER — Other Ambulatory Visit: Payer: Self-pay

## 2018-07-23 ENCOUNTER — Ambulatory Visit
Admission: RE | Admit: 2018-07-23 | Discharge: 2018-07-23 | Disposition: A | Discharge status: Home / Self Care | Payer: BC Managed Care – PPO | Source: Ambulatory Visit | Attending: Obstetrics and Gynecology | Admitting: Obstetrics and Gynecology

## 2018-07-23 DIAGNOSIS — Z1239 Encounter for other screening for malignant neoplasm of breast: Secondary | ICD-10-CM

## 2018-07-23 DIAGNOSIS — Z1231 Encounter for screening mammogram for malignant neoplasm of breast: Secondary | ICD-10-CM | POA: Insufficient documentation

## 2018-07-25 ENCOUNTER — Encounter: Payer: Self-pay | Admitting: Obstetrics and Gynecology

## 2018-08-04 ENCOUNTER — Other Ambulatory Visit (INDEPENDENT_AMBULATORY_CARE_PROVIDER_SITE_OTHER): Payer: BC Managed Care – PPO

## 2018-08-04 ENCOUNTER — Other Ambulatory Visit: Payer: Self-pay

## 2018-08-04 ENCOUNTER — Ambulatory Visit (INDEPENDENT_AMBULATORY_CARE_PROVIDER_SITE_OTHER): Payer: BC Managed Care – PPO

## 2018-08-04 DIAGNOSIS — M545 Low back pain: Secondary | ICD-10-CM

## 2018-08-04 DIAGNOSIS — I10 Essential (primary) hypertension: Secondary | ICD-10-CM

## 2018-08-04 DIAGNOSIS — R739 Hyperglycemia, unspecified: Secondary | ICD-10-CM

## 2018-08-04 DIAGNOSIS — G8929 Other chronic pain: Secondary | ICD-10-CM

## 2018-08-04 LAB — LIPID PANEL
Cholesterol: 179 mg/dL (ref 0–200)
HDL: 52.6 mg/dL (ref >39.00)
LDL Cholesterol: 108 mg/dL — ABNORMAL HIGH (ref 0–99)
NonHDL: 125.9
Total CHOL/HDL Ratio: 3
Triglycerides: 91 mg/dL (ref 0.0–149.0)
VLDL: 18.2 mg/dL (ref 0.0–40.0)

## 2018-08-04 LAB — COMPREHENSIVE METABOLIC PANEL
ALT: 12 U/L (ref 0–35)
AST: 17 U/L (ref 0–37)
Albumin: 4.6 g/dL (ref 3.5–5.2)
Alkaline Phosphatase: 91 U/L (ref 39–117)
BUN: 9 mg/dL (ref 6–23)
CO2: 30 mEq/L (ref 19–32)
Calcium: 9.5 mg/dL (ref 8.4–10.5)
Chloride: 103 mEq/L (ref 96–112)
Creatinine, Ser: 1.02 mg/dL (ref 0.40–1.20)
GFR: 66.97 mL/min (ref >60.00)
Glucose, Bld: 99 mg/dL (ref 70–99)
Potassium: 4.2 mEq/L (ref 3.5–5.1)
Sodium: 140 mEq/L (ref 135–145)
Total Bilirubin: 0.4 mg/dL (ref 0.2–1.2)
Total Protein: 7.3 g/dL (ref 6.0–8.3)

## 2018-08-04 LAB — HEMOGLOBIN A1C: Hgb A1c MFr Bld: 5.8 % (ref 4.6–6.5)

## 2018-08-16 ENCOUNTER — Encounter: Payer: Self-pay | Admitting: Internal Medicine

## 2018-09-07 ENCOUNTER — Ambulatory Visit (INDEPENDENT_AMBULATORY_CARE_PROVIDER_SITE_OTHER): Payer: BC Managed Care – PPO | Admitting: Internal Medicine

## 2018-09-07 ENCOUNTER — Other Ambulatory Visit: Payer: Self-pay

## 2018-09-07 ENCOUNTER — Ambulatory Visit: Payer: BLUE CROSS/BLUE SHIELD | Admitting: Internal Medicine

## 2018-09-07 ENCOUNTER — Encounter: Payer: Self-pay | Admitting: Internal Medicine

## 2018-09-07 DIAGNOSIS — R7303 Prediabetes: Secondary | ICD-10-CM | POA: Insufficient documentation

## 2018-09-07 DIAGNOSIS — E538 Deficiency of other specified B group vitamins: Secondary | ICD-10-CM

## 2018-09-07 DIAGNOSIS — I10 Essential (primary) hypertension: Secondary | ICD-10-CM | POA: Diagnosis not present

## 2018-09-07 DIAGNOSIS — Z1231 Encounter for screening mammogram for malignant neoplasm of breast: Secondary | ICD-10-CM

## 2018-09-07 DIAGNOSIS — Z1211 Encounter for screening for malignant neoplasm of colon: Secondary | ICD-10-CM

## 2018-09-07 DIAGNOSIS — E785 Hyperlipidemia, unspecified: Secondary | ICD-10-CM | POA: Diagnosis not present

## 2018-09-07 DIAGNOSIS — R29898 Other symptoms and signs involving the musculoskeletal system: Secondary | ICD-10-CM

## 2018-09-07 NOTE — Progress Notes (Addendum)
Virtual Visit via Video Note  I connected with Susan ChickAnnie Allison   on 09/07/18 at  3:30 PM EDT by a video enabled telemedicine application and verified that I am speaking with the correct person using two identifiers.  Location patient: home Location provider:work or home office Persons participating in the virtual visit: patient, provider  I discussed the limitations of evaluation and management by telemedicine and the availability of in person appointments. The patient expressed understanding and agreed to proceed.   HPI: 1. HTN BP 117-122-126/69-77 on hctz 25 mg qd and norvasc 2.5 mg qd  2. Leg weakness intermittent still present at times denies knee pain though h/o knee pain low back Xray negative except for gallstones and constipation  3. Constipation backed down to miralax 1x per week and less diarrhea sometime stool comes out in hard balls 3. [facility]   ROS: See pertinent positives and negatives per HPI.  Past Medical History:  Diagnosis Date  . Arthritis   . GERD (gastroesophageal reflux disease)   . Hypertension   . Joint pain   . Mastodynia   . Sinus problem     Past Surgical History:  Procedure Laterality Date  . COLONOSCOPY  2011  . ESOPHAGOGASTRODUODENOSCOPY N/A 08/14/2014   Procedure: ESOPHAGOGASTRODUODENOSCOPY (EGD);  Surgeon: Wallace CullensPaul Y Oh, MD;  Location[facility] ENDOSCOPY;  Service: Gastroenterology;  Laterality: N/A;    Family History  Problem Relation Age of Onset  . Breast cancer Other        40's  . Hypertension Mother   . Liver disease Mother   . Other Mother        brain tumor   . Prostate cancer Father [phone]  . Diabetes Brother   . Arthritis/Rheumatoid Sister   . Lupus Maternal Aunt     SOCIAL HX: lives at home    Current Outpatient Medications:  .  amLODipine (NORVASC) 2.5 MG tablet, Take 1 tablet (2.5 mg total) by mouth daily at 6 PM., Disp: 90 tablet, Rfl: 3 .  aspirin EC 81 MG tablet, Take 81 mg by mouth daily., Disp: ,  Rfl:  .  azelastine (ASTELIN) 0.1 % nasal spray, Place 2 sprays into both nostrils 2 (two) times daily. Use in each nostril as directed, Disp: , Rfl:  .  calcium carbonate (OS-CAL) 600 MG TABS tablet, Take 600 mg by mouth 2 (two) times daily with a meal., Disp: , Rfl:  .  cetirizine (ZYRTEC) 10 MG tablet, Take 10 mg by mouth daily., Disp: , Rfl:  .  cholecalciferol (VITAMIN D) 1000 units tablet, Take 1,000 Units by mouth daily., Disp: , Rfl:  .  hydrochlorothiazide (HYDRODIURIL) 25 MG tablet, Take 1 tablet (25 mg total) by mouth daily. In the am, Disp: 90 tablet, Rfl: 3  EXAM:  VITALS per patient if applicable:  GENERAL: alert, oriented, appears well and in no acute distress  HEENT: atraumatic, conjunttiva clear, no obvious abnormalities on inspection of external nose and ears  NECK: normal movements of the head and neck  LUNGS: on inspection no signs of respiratory distress, breathing rate appears normal, no obvious gross SOB, gasping or wheezing  CV: no obvious cyanosis  MS: moves all visible extremities without noticeable abnormality  PSYCH/NEURO: pleasant and cooperative, no obvious depression or anxiety, speech and thought processing grossly intact  ASSESSMENT AND PLAN:  Discussed the following assessment and plan:  Hypertension, unspecified type - Plan: Basic Metabolic Panel (BMET), CBC w/Diff, reduce hctz 25 to 12.5  mg qd with norvasc 2.5 mg qd   Prediabetes - Plan: Hemoglobin A1c  Hyperlipidemia, unspecified hyperlipidemia type - Plan: Lipid panel  B12 deficiency - Plan: B12  Leg weakness  -consider MRI brain, neurology for EMG in future   HM utd flu shot  Tdap due at f/u  Disc shingrix today again pt declines   Hep C negative  Declines HIV  Pap 03/25/2018 neg pap neg HPV  mammo 07/23/18 neg  04/17/09 colonoscopy  -normal f/u in 10 years  DEXA 03/13/10 normal do another age 73   I discussed the assessment and treatment plan with the patient. The patient was  provided an opportunity to ask questions and all were answered. The patient agreed with the plan and demonstrated an understanding of the instructions.   The patient was advised to call back or seek an in-person evaluation if the symptoms worsen or if the condition fails to improve as anticipated.  Time spent 15 minutes  Delorise Jackson, MD

## 2018-09-08 ENCOUNTER — Encounter: Payer: Self-pay | Admitting: Internal Medicine

## 2018-10-12 ENCOUNTER — Other Ambulatory Visit: Payer: Self-pay | Admitting: Internal Medicine

## 2018-10-12 ENCOUNTER — Encounter: Payer: Self-pay | Admitting: Internal Medicine

## 2018-10-12 DIAGNOSIS — I10 Essential (primary) hypertension: Secondary | ICD-10-CM

## 2018-10-12 MED ORDER — HYDROCHLOROTHIAZIDE 12.5 MG PO TABS: 12.5000 mg | ORAL_TABLET | Freq: Every day | ORAL | 3 refills | Status: DC

## 2018-11-03 ENCOUNTER — Other Ambulatory Visit: Payer: Self-pay

## 2018-11-03 ENCOUNTER — Ambulatory Visit (INDEPENDENT_AMBULATORY_CARE_PROVIDER_SITE_OTHER): Payer: BC Managed Care – PPO

## 2018-11-03 DIAGNOSIS — Z23 Encounter for immunization: Secondary | ICD-10-CM | POA: Diagnosis not present

## 2018-11-24 DIAGNOSIS — H40003 Preglaucoma, unspecified, bilateral: Secondary | ICD-10-CM | POA: Diagnosis not present

## 2018-12-06 ENCOUNTER — Other Ambulatory Visit: Payer: Self-pay

## 2018-12-06 ENCOUNTER — Other Ambulatory Visit (INDEPENDENT_AMBULATORY_CARE_PROVIDER_SITE_OTHER): Payer: BC Managed Care – PPO

## 2018-12-06 DIAGNOSIS — E538 Deficiency of other specified B group vitamins: Secondary | ICD-10-CM | POA: Diagnosis not present

## 2018-12-06 DIAGNOSIS — E785 Hyperlipidemia, unspecified: Secondary | ICD-10-CM

## 2018-12-06 DIAGNOSIS — I10 Essential (primary) hypertension: Secondary | ICD-10-CM

## 2018-12-06 DIAGNOSIS — R7303 Prediabetes: Secondary | ICD-10-CM

## 2018-12-06 LAB — LIPID PANEL
Cholesterol: 164 mg/dL (ref 0–200)
HDL: 47.4 mg/dL (ref >39.00)
LDL Cholesterol: 97 mg/dL (ref 0–99)
NonHDL: 116.98
Total CHOL/HDL Ratio: 3
Triglycerides: 98 mg/dL (ref 0.0–149.0)
VLDL: 19.6 mg/dL (ref 0.0–40.0)

## 2018-12-06 LAB — CBC WITH DIFFERENTIAL/PLATELET
Basophils Absolute: 0 10*3/uL (ref 0.0–0.1)
Basophils Relative: 0.8 % (ref 0.0–3.0)
Eosinophils Absolute: 0.1 10*3/uL (ref 0.0–0.7)
Eosinophils Relative: 1.9 % (ref 0.0–5.0)
HCT: 38 % (ref 36.0–46.0)
Hemoglobin: 12.6 g/dL (ref 12.0–15.0)
Lymphocytes Relative: 39 % (ref 12.0–46.0)
Lymphs Abs: 1.5 10*3/uL (ref 0.7–4.0)
MCHC: 33.1 g/dL (ref 30.0–36.0)
MCV: 85.6 fl (ref 78.0–100.0)
Monocytes Absolute: 0.4 10*3/uL (ref 0.1–1.0)
Monocytes Relative: 9.3 % (ref 3.0–12.0)
Neutro Abs: 1.9 10*3/uL (ref 1.4–7.7)
Neutrophils Relative %: 49 % (ref 43.0–77.0)
Platelets: 246 10*3/uL (ref 150.0–400.0)
RBC: 4.44 Mil/uL (ref 3.87–5.11)
RDW: 14 % (ref 11.5–15.5)
WBC: 3.8 10*3/uL — ABNORMAL LOW (ref 4.0–10.5)

## 2018-12-06 LAB — BASIC METABOLIC PANEL
BUN: 12 mg/dL (ref 6–23)
CO2: 29 mEq/L (ref 19–32)
Calcium: 9.4 mg/dL (ref 8.4–10.5)
Chloride: 104 mEq/L (ref 96–112)
Creatinine, Ser: 1.03 mg/dL (ref 0.40–1.20)
GFR: 66.14 mL/min (ref >60.00)
Glucose, Bld: 101 mg/dL — ABNORMAL HIGH (ref 70–99)
Potassium: 4.4 mEq/L (ref 3.5–5.1)
Sodium: 141 mEq/L (ref 135–145)

## 2018-12-06 LAB — HEMOGLOBIN A1C: Hgb A1c MFr Bld: 6 % (ref 4.6–6.5)

## 2018-12-06 LAB — VITAMIN B12: Vitamin B-12: 286 pg/mL (ref 211–911)

## 2018-12-07 ENCOUNTER — Other Ambulatory Visit: Payer: Self-pay

## 2018-12-09 ENCOUNTER — Ambulatory Visit (INDEPENDENT_AMBULATORY_CARE_PROVIDER_SITE_OTHER): Payer: BC Managed Care – PPO | Admitting: Internal Medicine

## 2018-12-09 ENCOUNTER — Ambulatory Visit (INDEPENDENT_AMBULATORY_CARE_PROVIDER_SITE_OTHER): Payer: BC Managed Care – PPO

## 2018-12-09 ENCOUNTER — Encounter: Payer: Self-pay | Admitting: Internal Medicine

## 2018-12-09 ENCOUNTER — Other Ambulatory Visit: Payer: Self-pay

## 2018-12-09 VITALS — BP 116/70 | HR 77 | Temp 97.3°F | Ht 60.0 in | Wt 135.6 lb

## 2018-12-09 DIAGNOSIS — M542 Cervicalgia: Secondary | ICD-10-CM | POA: Diagnosis not present

## 2018-12-09 DIAGNOSIS — D72819 Decreased white blood cell count, unspecified: Secondary | ICD-10-CM | POA: Diagnosis not present

## 2018-12-09 DIAGNOSIS — R29898 Other symptoms and signs involving the musculoskeletal system: Secondary | ICD-10-CM | POA: Diagnosis not present

## 2018-12-09 NOTE — Progress Notes (Signed)
Chief Complaint  Patient presents with  . Follow-up   F/u  1. HTN on norvasc 2.5 mg qd and hctz 12.5 mg qd BP controlled 120s/70s controlled 2.leukopenia will repeat CBC today  3. C/o chronic neck pain chronic worse qhs 8/10 has spasm moving helps declines to try muscle relaxer. No pain with ROM 4. Chronic leg weakness at times B12 low normal 286 no lab etiology for cause will refer to neurology to further w/u   Review of Systems  Constitutional: Negative for weight loss.  HENT: Negative for hearing loss.   Eyes: Negative for blurred vision.  Respiratory: Negative for shortness of breath.   Cardiovascular: Negative for chest pain.  Gastrointestinal: Negative for abdominal pain.  Musculoskeletal: Positive for neck pain.       +muscle spasm    Skin: Negative for rash.  Neurological: Positive for weakness. Negative for headaches.  Psychiatric/Behavioral: Negative for depression.   Past Medical History:  Diagnosis Date  . Arthritis   . GERD (gastroesophageal reflux disease)   . Hypertension   . Joint pain   . Mastodynia   . Sinus problem    Past Surgical History:  Procedure Laterality Date  . COLONOSCOPY  2011  . ESOPHAGOGASTRODUODENOSCOPY N/A 08/14/2014   Procedure: ESOPHAGOGASTRODUODENOSCOPY (EGD);  Surgeon: Wallace CullensPaul Y Oh, MD;  [facility];  Service: Gastroenterology;  Laterality: N/A;   Family History  Problem Relation Age of Onset  . Breast cancer Other        40's  . Hypertension Mother   . Liver disease Mother   . Other Mother        brain tumor   . Prostate cancer Father [phone]  . Diabetes Brother   . Arthritis/Rheumatoid Sister   . Lupus Maternal Aunt    Social History   Socioeconomic History  . Marital status: Married    Spouse name: Not on file  . Number of children: Not on file  . Years of education: Not on file  . Highest education level: Not on file  Occupational History  . Not on file  Social Needs  . Financial resource strain: Not on file   . Food insecurity    Worry: Not on file    Inability: Not on file  . Transportation needs    Medical: Not on file    Non-medical: Not on file  Tobacco Use  . Smoking status: Never Smoker  . Smokeless tobacco: Never Used  Substance and Sexual Activity  . Alcohol use: No  . Drug use: No  . Sexual activity: Yes    Birth control/protection: Post-menopausal  Lifestyle  . Physical activity    Days per week: Not on file    Minutes per session: Not on file  . Stress: Not on file  Relationships  . Social Musicianconnections    Talks on phone: Not on file    Gets together: Not on file    Attends religious service: Not on file    Active member of club or organization: Not on file    Attends meetings of clubs or organizations: Not on file    Relationship status: Not on file  . Intimate partner violence    Fear of current or ex partner: Not on file    Emotionally abused: Not on file    Physically abused: Not on file    Forced sexual activity: Not on file  Other Topics Concern  . Not on file  Social History Narrative   Married.  2 children. 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    Current Meds  Medication Sig  . amLODipine (NORVASC) 2.5 MG tablet Take 1 tablet (2.5 mg total) by mouth daily at 6 PM.  . aspirin EC 81 MG tablet Take 81 mg by mouth daily.  Marland Kitchen azelastine (ASTELIN) 0.1 % nasal spray Place 2 sprays into both nostrils 2 (two) times daily. Use in each nostril as directed  . calcium carbonate (OS-CAL) 600 MG TABS tablet Take 600 mg by mouth 2 (two) times daily with a meal.  . cetirizine (ZYRTEC) 10 MG tablet Take 10 mg by mouth daily.  . cholecalciferol (VITAMIN D) 1000 units tablet Take 1,000 Units by mouth daily.  . hydrochlorothiazide (HYDRODIURIL) 12.5 MG tablet Take 1 tablet (12.5 mg total) by mouth daily. In the am   Allergies  Allergen Reactions  . Lisinopril     Possible cause of  itching/ elevated liver enzymes - discontinued 06/03/17.  Cough    Recent Results (from the past 2160 hour(s))  B12     Status: None   Collection Time: 12/06/18  8:10 AM  Result Value Ref Range   Vitamin B-12 286 211 - 911 pg/mL  Hemoglobin A1c     Status: None   Collection Time: 12/06/18  8:10 AM  Result Value Ref Range   Hgb A1c MFr Bld 6.0 4.6 - 6.5 %    Comment: Glycemic Control Guidelines for People with Diabetes:Non Diabetic:  <6%Goal of Therapy: <7%Additional Action Suggested:  >8%   Lipid panel     Status: None   Collection Time: 12/06/18  8:10 AM  Result Value Ref Range   Cholesterol 164 0 - 200 mg/dL    Comment: ATP III Classification       Desirable:  < 200 mg/dL               Borderline High:  200 - 239 mg/dL          High:  > = 161 mg/dL   Triglycerides 09.6 0.0 - 149.0 mg/dL    Comment: Normal:  <045 mg/dLBorderline High:  150 - 199 mg/dL   HDL 40.98 >11.91 mg/dL   VLDL 47.8 0.0 - 29.5 mg/dL   LDL Cholesterol 97 0 - 99 mg/dL   Total CHOL/HDL Ratio 3     Comment:                Men          Women1/2 Average Risk     3.4          3.3Average Risk          5.0          4.42X Average Risk          9.6          7.13X Average Risk          15.0          11.0                       NonHDL 116.98     Comment: NOTE:  Non-HDL goal should be 30 mg/dL higher than patient's LDL goal (i.e. LDL goal of < 70 mg/dL, would have non-HDL goal of < 100 mg/dL)  CBC w/Diff     Status: Abnormal   Collection Time: 12/06/18  8:10 AM  Result Value Ref Range   WBC 3.8 (L) 4.0 - 10.5 K/uL   RBC 4.44 3.87 - 5.11 Mil/uL   Hemoglobin 12.6 12.0 - 15.0 g/dL   HCT 38.0 36.0 - 46.0 %   MCV 85.6 78.0 - 100.0 fl   MCHC 33.1 30.0 - 36.0 g/dL   RDW 14.0 11.5 - 15.5 %   Platelets 246.0 150.0 - 400.0 K/uL   Neutrophils Relative % 49.0 43.0 - 77.0 %   Lymphocytes Relative 39.0 12.0 - 46.0 %   Monocytes Relative 9.3 3.0 - 12.0 %   Eosinophils Relative 1.9 0.0 - 5.0 %   Basophils Relative 0.8 0.0 - 3.0 %    Neutro Abs 1.9 1.4 - 7.7 K/uL   Lymphs Abs 1.5 0.7 - 4.0 K/uL   Monocytes Absolute 0.4 0.1 - 1.0 K/uL   Eosinophils Absolute 0.1 0.0 - 0.7 K/uL   Basophils Absolute 0.0 0.0 - 0.1 K/uL  Basic Metabolic Panel (BMET)     Status: Abnormal   Collection Time: 12/06/18  8:10 AM  Result Value Ref Range   Sodium 141 135 - 145 mEq/L   Potassium 4.4 3.5 - 5.1 mEq/L   Chloride 104 96 - 112 mEq/L   CO2 29 19 - 32 mEq/L   Glucose, Bld 101 (H) 70 - 99 mg/dL   BUN 12 6 - 23 mg/dL   Creatinine, Ser 1.03 0.40 - 1.20 mg/dL   GFR 66.14 >60.00 mL/min   Calcium 9.4 8.4 - 10.5 mg/dL   Objective  Body mass index is 26.48 kg/m. Wt Readings from Last 3 Encounters:  12/09/18 135 lb 9.6 oz (61.5 kg)  04/22/18 131 lb 6.4 oz (59.6 kg)  04/01/18 131 lb (59.4 kg)   Temp Readings from Last 3 Encounters:  12/09/18 (!) 97.3 F (36.3 C) (Skin)  04/22/18 98.7 F (37.1 C) (Oral)  04/01/18 97.6 F (36.4 C)   BP Readings from Last 3 Encounters:  12/09/18 116/70  04/22/18 (!) 152/82  04/01/18 (!) 162/80   Pulse Readings from Last 3 Encounters:  12/09/18 77  04/22/18 97  04/01/18 (!) 54    Physical Exam Vitals signs and nursing note reviewed.  Constitutional:      Appearance: Normal appearance. She is well-developed and well-groomed.     Comments: +mask on    HENT:     Head: Normocephalic and atraumatic.  Eyes:     Conjunctiva/sclera: Conjunctivae normal.     Pupils: Pupils are equal, round, and reactive to light.  Cardiovascular:     Rate and Rhythm: Normal rate and regular rhythm.     Heart sounds: Normal heart sounds. No murmur.  Pulmonary:     Effort: Pulmonary effort is normal.     Breath sounds: Normal breath sounds.  Musculoskeletal:     Cervical back: She exhibits tenderness and spasm.  Skin:    General: Skin is warm and dry.  Neurological:     General: No focal deficit present.     Mental Status: She is alert and oriented to person, place, and time. Mental status is at baseline.      Gait: Gait normal.  Psychiatric:        Attention and Perception: Attention and perception normal.        Mood and Affect: Mood and affect normal.        Speech: Speech normal.        Behavior: Behavior normal. Behavior is cooperative.        Thought Content:  Thought content normal.        Cognition and Memory: Cognition and memory normal.        Judgment: Judgment normal.     Assessment  Plan  Cervicalgia - Plan: DG Cervical Spine Complete, CANCELED: DG Cervical Spine Complete  Leukopenia, unspecified type - Plan: CBC with Differential/Platelet  Weakness of both lower extremities - Plan: Ambulatory referral to Neurology for further w/u consider EMG/NCS b/l legs and MRI brain  HM utd flu shot Tdap dueat f/u  Disc shingrix today again pt declines   Hep C negative  Declines HIV  Pap 03/25/2018 neg pap neg HPV  mammo 07/23/18 neg 04/17/09 colonoscopy  -normal f/u in 10 years due at f/u  DEXA 03/13/10 normal do another age 74  Provider: Dr. French Ana McLean-Scocuzza-Internal Medicine

## 2018-12-09 NOTE — Patient Instructions (Addendum)
Referred [facility]  [place]  Budget-Friendly Healthy Eating There are many ways to save money at the grocery store and continue to eat healthy. You can be successful if you:  Plan meals according to your budget.  Make a grocery list and only purchase food according to your grocery list.  Prepare food yourself. What are tips for following this plan?  Reading food labels  Compare food labels between brand name foods and the store brand. Often the nutritional value is the same, but the store brand is lower cost.  Look for products that do not have added sugar, fat, or salt (sodium). These often cost the same but are healthier for you. Products may be labeled as: ? Sugar-free. ? Nonfat. ? Low-fat. ? Sodium-free. ? Low-sodium.  Look for lean ground beef labeled as at least 92% lean and 8% fat. Shopping  Buy only the items on your grocery list and go only to the areas of the store that have the items on your list.  Use coupons only for foods and brands you normally buy. Avoid buying items you wouldn't normally buy simply because they are on sale.  Check online and in newspapers for weekly deals.  Buy healthy items from the bulk bins when available, such as herbs, spices, flour, pasta, nuts, and dried fruit.  Buy fruits and vegetables that are in season. Prices are usually lower on in-season produce.  Look at the unit price on the price tag. Use it to compare different brands and sizes to find out which item is the best deal.  Choose healthy items that are often low-cost, such as carrots, potatoes, apples, bananas, and oranges. Dried or canned beans are a low-cost protein source.  Buy in bulk and freeze extra food. Items you can buy in bulk include meats, fish, poultry, frozen fruits, and frozen vegetables.  Avoid buying "ready-to-eat" foods, such as pre-cut fruits and vegetables and pre-made salads.  If  possible, shop around to discover where you can find the best prices. Consider other retailers such as dollar stores, larger [employer], local fruit and vegetable stands, and farmers markets.  Do not shop when you are hungry. If you shop while hungry, it may be hard to stick to your list and budget.  Resist impulse buying. Use your grocery list as your official plan for the week.  Buy a variety of vegetables and fruits by purchasing fresh, frozen, and canned items.  Look at the top and bottom shelves for deals. Foods at eye level (eye level of an adult or child) are usually more expensive.  Be efficient with your time when shopping. The more time you spend at the store, the more money you are likely to spend.  To save money when choosing more expensive foods like meats and dairy: ? Choose cheaper cuts of meat, such as bone-in chicken thighs and drumsticks instead of skinless and boneless chicken. When you are ready to prepare the chicken, you can remove the skin yourself to make it healthier. ? Choose lean meats like chicken or [place] instead of beef. ? Choose canned seafood, such as tuna, salmon, or sardines. ? Buy eggs as a low-cost source of protein. ? Buy dried beans and peas, such as lentils, split peas, or kidney beans instead of meats. Dried beans and peas are a good alternative source of protein. ? Buy the larger tubs of yogurt instead of individual-sized containers.  Choose water  instead of sodas and other sweetened beverages.  Avoid buying chips, cookies, and other "junk food." These items are usually expensive and not healthy. Cooking  Make extra food and freeze the extras in meal-sized containers or in individual portions for fast meals and snacks.  Pre-cook on days when you have extra time to prepare meals in advance. You can keep these meals in the fridge or freezer and reheat for a quick meal.  When you come home from the grocery store, wash, peel, and cut fruits and  vegetables so they are ready to use and eat. This will help reduce food waste. Meal planning  Do not eat out or get fast food. Prepare food at home.  Make a grocery list and make sure to bring it with you to the store. If you have a smart phone, you could use your phone to create your shopping list.  Plan meals and snacks according to a grocery list and budget you create.  Use leftovers in your meal plan for the week.  Look for recipes where you can cook once and make enough food for two meals.  Include budget-friendly meals like stews, casseroles, and stir-fry dishes.  Try some meatless meals or try "no cook" meals like salads.  Make sure that half your plate is filled with fruits or vegetables. Choose from fresh, frozen, or canned fruits and vegetables. If eating canned, remember to rinse them before eating. This will remove any excess salt added for packaging. Summary  Eating healthy on a budget is possible if you plan your meals according to your budget, purchase according to your budget and grocery list, and prepare food yourself.  Tips for buying more food on a limited budget include buying generic brands, using coupons only for foods you normally buy, and buying healthy items from the bulk bins when available.  Tips for buying cheaper food to replace expensive food include choosing cheaper, lean cuts of meat, and buying dried beans and peas. This information is not intended to replace advice given to you by your health care provider. Make sure you discuss any questions you have with your health care provider. Document Released: 09/16/2013 Document Revised: 01/14/2017 Document Reviewed: 01/14/2017 Elsevier Patient Education  2020 [place] Following a healthy eating pattern may help you to achieve and maintain a healthy body weight, reduce the risk of chronic disease, and live a long and productive life. It is important to follow a healthy eating pattern at an  appropriate calorie level for your body. Your nutritional needs should be met primarily through food by choosing a variety of nutrient-rich foods. What are tips for following this plan? Reading food labels  Read labels and choose the following: ? Reduced or low sodium. ? Juices with 100% fruit juice. ? Foods with low saturated fats and high polyunsaturated and monounsaturated fats. ? Foods with whole grains, such as whole wheat, cracked wheat, brown rice, and wild rice. ? Whole grains that are fortified with folic acid. This is recommended for women who are pregnant or who want to become pregnant.  Read labels and avoid the following: ? Foods with a lot of added sugars. These include foods that contain brown sugar, corn sweetener, corn syrup, dextrose, fructose, glucose, high-fructose corn syrup, honey, invert sugar, lactose, malt syrup, maltose, molasses, raw sugar, sucrose, trehalose, or turbinado sugar.  Do not eat more than the following amounts of added sugar per day:  6 teaspoons (25 g) for women.  9 teaspoons (  38 g) for men. ? Foods that contain processed or refined starches and grains. ? Refined grain products, such as white flour, degermed cornmeal, white bread, and white rice. Shopping  Choose nutrient-rich snacks, such as vegetables, whole fruits, and nuts. Avoid high-calorie and high-sugar snacks, such as potato chips, fruit snacks, and candy.  Use oil-based dressings and spreads on foods instead of solid fats such as butter, stick margarine, or cream cheese.  Limit pre-made sauces, mixes, and "instant" products such as flavored rice, instant noodles, and ready-made pasta.  Try more plant-protein sources, such as tofu, tempeh, black beans, edamame, lentils, nuts, and seeds.  Explore eating plans such as the Mediterranean diet or vegetarian diet. Cooking  Use oil to saut or stir-fry foods instead of solid fats such as butter, stick margarine, or lard.  Try baking,  boiling, grilling, or broiling instead of frying.  Remove the fatty part of meats before cooking.  Steam vegetables in water or broth. Meal planning   At meals, imagine dividing your plate into fourths: ? One-half of your plate is fruits and vegetables. ? One-fourth of your plate is whole grains. ? One-fourth of your plate is protein, especially lean meats, poultry, eggs, tofu, beans, or nuts.  Include low-fat dairy as part of your daily diet. Lifestyle  Choose healthy options in all settings, including home, work, school, restaurants, or stores.  Prepare your food safely: ? Wash your hands after handling raw meats. ? Keep food preparation surfaces clean by regularly washing with hot, soapy water. ? Keep raw meats separate from ready-to-eat foods, such as fruits and vegetables. ? Cook seafood, meat, poultry, and eggs to the recommended internal temperature. ? Store foods at safe temperatures. In general:  Keep cold foods at 68F (4.4C) or below.  Keep hot foods at 168F (60C) or above.  Keep your freezer at [facility] (-17.8C) or below.  Foods are no longer safe to eat when they have been between the temperatures of 40-168F (4.4-60C) for more than 2 hours. What foods should I eat? Fruits Aim to eat 2 cup-equivalents of fresh, canned (in natural juice), or frozen fruits each day. Examples of 1 cup-equivalent of fruit include 1 small apple, 8 large strawberries, 1 cup canned fruit,  cup dried fruit, or 1 cup 100% juice. Vegetables Aim to eat 2-3 cup-equivalents of fresh and frozen vegetables each day, including different varieties and colors. Examples of 1 cup-equivalent of vegetables include 2 medium carrots, 2 cups raw, leafy greens, 1 cup chopped vegetable (raw or cooked), or 1 medium baked potato. Grains Aim to eat 6 ounce-equivalents of whole grains each day. Examples of 1 ounce-equivalent of grains include 1 slice of bread, 1 cup ready-to-eat cereal, 3 cups popcorn, or   cup cooked rice, pasta, or cereal. Meats and other proteins Aim to eat 5-6 ounce-equivalents of protein each day. Examples of 1 ounce-equivalent of protein include 1 egg, 1/2 cup nuts or seeds, or 1 tablespoon (16 g) peanut butter. A cut of meat or fish that is the size of a deck of cards is about 3-4 ounce-equivalents.  Of the protein you eat each week, try to have at least 8 ounces come from seafood. This includes salmon, trout, herring, and anchovies. Dairy Aim to eat 3 cup-equivalents of fat-free or low-fat dairy each day. Examples of 1 cup-equivalent of dairy include 1 cup (240 mL) milk, 8 ounces (250 g) yogurt, 1 ounces (44 g) natural cheese, or 1 cup (240 mL) fortified soy milk. Fats and  oils  Aim for about 5 teaspoons (21 g) per day. Choose monounsaturated fats, such as canola and olive oils, avocados, peanut butter, and most nuts, or polyunsaturated fats, such as sunflower, corn, and soybean oils, walnuts, pine nuts, sesame seeds, sunflower seeds, and flaxseed. Beverages  Aim for six 8-oz glasses of water per day. Limit coffee to three to five 8-oz cups per day.  Limit caffeinated beverages that have added calories, such as soda and energy drinks.  Limit alcohol intake to no more than 1 drink a day for nonpregnant women and 2 drinks a day for men. One drink equals 12 oz of beer (355 mL), 5 oz of wine (148 mL), or 1 oz of hard liquor (44 mL). Seasoning and other foods  Avoid adding excess amounts of salt to your foods. Try flavoring foods with herbs and spices instead of salt.  Avoid adding sugar to foods.  Try using oil-based dressings, sauces, and spreads instead of solid fats. This information is based on general [place] nutrition guidelines. For more information, visit BuildDNA.es. Exact amounts may vary based on your nutrition needs. Summary  A healthy eating plan may help you to maintain a healthy weight, reduce the risk of chronic diseases, and stay active throughout  your life.  Plan your meals. Make sure you eat the right portions of a variety of nutrient-rich foods.  Try baking, boiling, grilling, or broiling instead of frying.  Choose healthy options in all settings, including home, work, school, restaurants, or stores. This information is not intended to replace advice given to you by your health care provider. Make sure you discuss any questions you have with your health care provider. Document Released: 04/27/2017 Document Revised: 04/27/2017 Document Reviewed: 04/27/2017 Elsevier Patient Education  [place].  Preventing Unhealthy [employer], Adult Staying at a healthy weight is important to your overall health. When fat builds up in your body, you may become overweight or obese. Being overweight or obese increases your risk of developing certain health problems, such as heart disease, diabetes, sleeping problems, joint problems, and some types of cancer. Unhealthy weight gain is often the result of making unhealthy food choices or not getting enough exercise. You can make changes to your lifestyle to prevent obesity and stay as healthy as possible. What nutrition changes can be made?   Eat only as much as your body needs. To do this: ? Pay attention to signs that you are hungry or full. Stop eating as soon as you feel full. ? If you feel hungry, try drinking water first before eating. Drink enough water so your urine is clear or pale yellow. ? Eat smaller portions. Pay attention to portion sizes when eating out. ? Look at serving sizes on food labels. Most foods contain more than one serving per container. ? Eat the recommended number of calories for your gender and activity level. For most active people, a daily total of 2,000 calories is appropriate. If you are trying to lose weight or are not very active, you may need to eat fewer calories. Talk with your health care provider or a diet and nutrition specialist (dietitian) about how many  calories you need each day.  Choose healthy foods, such as: ? Fruits and vegetables. At each meal, try to fill at least half of your plate with fruits and vegetables. ? Whole grains, such as whole-wheat bread, brown rice, and quinoa. ? Lean meats, such as chicken or fish. ? Other healthy proteins, such as beans, eggs,  or tofu. ? Healthy fats, such as nuts, seeds, fatty fish, and olive oil. ? Low-fat or fat-free dairy products.  Check food labels, and avoid food and drinks that: ? Are high in calories. ? Have added sugar. ? Are high in sodium. ? Have saturated fats or trans fats.  Cook foods in healthier ways, such as by baking, broiling, or grilling.  Make a meal plan for the week, and shop with a grocery list to help you stay on track with your purchases. Try to avoid going to the grocery store when you are hungry.  When grocery shopping, try to shop around the outside of the store first, where the fresh foods are. Doing this helps you to avoid prepackaged foods, which can be high in sugar, salt (sodium), and fat. What lifestyle changes can be made?   Exercise for 30 or more minutes on 5 or more days each week. Exercising may include brisk walking, yard work, biking, running, swimming, and team sports like basketball and soccer. Ask your health care provider which exercises are safe for you.  Do muscle-strengthening activities, such as lifting weights or using resistance bands, on 2 or more days a week.  Do not use any products that contain nicotine or tobacco, such as cigarettes and e-cigarettes. If you need help quitting, ask your health care provider.  Limit alcohol intake to no more than 1 drink a day for nonpregnant women and 2 drinks a day for men. One drink equals 12 oz of beer, 5 oz of wine, or 1 oz of hard liquor.  Try to get 7-9 hours of sleep each night. What other changes can be made?  Keep a food and activity journal to keep track of: ? What you ate and how many  calories you had. Remember to count the calories in sauces, dressings, and side dishes. ? Whether you were active, and what exercises you did. ? Your calorie, weight, and activity goals.  Check your weight regularly. Track any changes. If you notice you have gained weight, make changes to your diet or activity routine.  Avoid taking weight-loss medicines or supplements. Talk to your health care provider before starting any new medicine or supplement.  Talk to your health care provider before trying any new diet or exercise plan. Why are these changes important? Eating healthy, staying active, and having healthy habits can help you to prevent obesity. Those changes also:  Help you manage stress and emotions.  Help you connect with friends and family.  Improve your self-esteem.  Improve your sleep.  Prevent long-term health problems. What can happen if changes are not made? Being obese or overweight can cause you to develop joint or bone problems, which can make it hard for you to stay active or do activities you enjoy. Being obese or overweight also puts stress on your heart and lungs and can lead to health problems like diabetes, heart disease, and some cancers. Where to find more information Talk with your health care provider or a dietitian about healthy eating and healthy lifestyle choices. You may also find information from:  U.S. Department of Agriculture, MyPlate: FormerBoss.no  American Heart Association: www.heart.org  Centers for Disease Control and Prevention: http://www.wolf.info/ Summary  Staying at a healthy weight is important to your overall health. It helps you to prevent certain diseases and health problems, such as heart disease, diabetes, joint problems, sleep disorders, and some types of cancer.  Being obese or overweight can cause you to develop joint or  bone problems, which can make it hard for you to stay active or do activities you enjoy.  You can prevent  unhealthy weight gain by eating a healthy diet, exercising regularly, not smoking, limiting alcohol, and getting enough sleep.  Talk with your health care provider or a dietitian for guidance about healthy eating and healthy lifestyle choices. This information is not intended to replace advice given to you by your health care provider. Make sure you discuss any questions you have with your health care provider. Document Released: 01/15/2016 Document Revised: 01/16/2017 Document Reviewed: 02/20/2016 Elsevier Patient Education  [place].    Leukopenia Leukopenia is a condition in which you have a low number of white blood cells. White blood cells help the body to fight infections. The number of white blood cells in the body varies from person to person. There are five types of white blood cells. Two types (lymphocytes and neutrophils) make up most of the white blood cell count. When lymphocytes are low, the condition is called lymphocytopenia. When neutrophils are low, it is called neutropenia. Neutropenia is the most dangerous type of leukopenia because it can lead to dangerous infections. What are the causes? This condition is commonly caused by damage to soft tissue inside of the bones (bone marrow), which is where most white blood cells are made. Bone marrow can get damaged by:  Medicine or X-ray treatments for cancer (chemotherapy or radiation therapy).  Serious infections.  Cancer of the white blood cells (leukemia, lymphoma, or myeloma).  Medicines, including: ? Certain antibiotics. ? Certain heart medicines. ? Steroids. ? Certain medicines used to treat diseases of the immune system (autoimmune diseases), like rheumatoid arthritis. Leukopenia also happens when white blood cells are destroyed after leaving the bone marrow, which may result from:  Liver disease.  Autoimmune disease.  Vitamin B deficiencies. What are the signs or symptoms? One of the most common signs  of leukopenia, especially severe neutropenia, is having a lot of bacterial infections. Different infections have different symptoms. An infection in your lungs may cause coughing. A urinary tract infection may cause frequent urination and a burning sensation. You may also get infections of the blood, skin, rectum, throat, sinuses, or ears. Some people have no symptoms. If you do have symptoms, they may include:  Fever.  Fatigue.  Swollen glands (lymph nodes).  Painful mouth ulcers.  Gum disease. How is this diagnosed? This condition may be diagnosed based on:  Your medical history.  A physical exam to check for swollen lymph nodes and an enlarged spleen. Your spleen is an organ on the left side of your body that stores white blood cells.  Tests, such as: ? A complete blood count. This blood test counts each type of white cell. ? Bone marrow aspiration. Some bone marrow is removed to be checked under a microscope. ? Lymph node biopsy. Some lymph node tissue is removed to be checked under a microscope. ? Other types of blood tests or imaging tests. How is this treated? Treatment of leukopenia depends on the cause. Some common treatments include:  Antibiotic medicine to treat bacterial infections.  Stopping medicines that may cause leukopenia.  Medicines to stimulate neutrophil production (hematopoietic growth factors), to treat neutropenia. Follow these instructions at home:  Take over-the-counter and prescription medicines only as told by your health care provider. This includes supplements and vitamins.  If you were prescribed an antibiotic medicine, take it as told by your health care provider. Do not stop taking the  antibiotic even if you start to feel better.  Preventing infection is important if you have leukopenia. To prevent infection: ? Avoid close contact with sick people. ? Wash your hands frequently with soap and water. If soap and water are not available, use hand  sanitizer. ? Do not eat uncooked or undercooked meats. ? Wash fruits and vegetables before eating them. ? Do not eat or drink unpasteurized dairy products. ? Get regular dental care, and maintain good dental hygiene. You should visit the dentist at least once every 6 months.  Keep all follow-up visits as told by your health care provider. This is important. Contact a health care provider if:  You have chills or a fever.  You have symptoms of an infection. Get help right away if:  You have a fever that lasts for more than 2-3 days.  You have symptoms that last for more than 2-3 days.  You have trouble breathing.  You have chest pain. This information is not intended to replace advice given to you by your health care provider. Make sure you discuss any questions you have with your health care provider. Document Released: 01/18/2013 Document Revised: 12/26/2016 Document Reviewed: 12/04/2015 Elsevier Patient Education  2020 [employer].

## 2018-12-10 LAB — CBC WITH DIFFERENTIAL/PLATELET
Basophils Absolute: 0.1 10*3/uL (ref 0.0–0.1)
Basophils Relative: 1.4 % (ref 0.0–3.0)
Eosinophils Absolute: 0.1 10*3/uL (ref 0.0–0.7)
Eosinophils Relative: 1.2 % (ref 0.0–5.0)
HCT: 38.7 % (ref 36.0–46.0)
Hemoglobin: 12.5 g/dL (ref 12.0–15.0)
Lymphocytes Relative: 34.4 % (ref 12.0–46.0)
Lymphs Abs: 1.7 10*3/uL (ref 0.7–4.0)
MCHC: 32.3 g/dL (ref 30.0–36.0)
MCV: 86.2 fl (ref 78.0–100.0)
Monocytes Absolute: 0.5 10*3/uL (ref 0.1–1.0)
Monocytes Relative: 10.2 % (ref 3.0–12.0)
Neutro Abs: 2.6 10*3/uL (ref 1.4–7.7)
Neutrophils Relative %: 52.8 % (ref 43.0–77.0)
Platelets: 254 10*3/uL (ref 150.0–400.0)
RBC: 4.49 Mil/uL (ref 3.87–5.11)
RDW: 14.2 % (ref 11.5–15.5)
WBC: 4.9 10*3/uL (ref 4.0–10.5)

## 2018-12-17 DIAGNOSIS — Z1211 Encounter for screening for malignant neoplasm of colon: Secondary | ICD-10-CM | POA: Diagnosis not present

## 2018-12-17 DIAGNOSIS — Z01818 Encounter for other preprocedural examination: Secondary | ICD-10-CM | POA: Diagnosis not present

## 2019-01-06 ENCOUNTER — Ambulatory Visit: Payer: BC Managed Care – PPO | Admitting: Nurse Practitioner

## 2019-01-06 ENCOUNTER — Encounter: Payer: Self-pay | Admitting: Nurse Practitioner

## 2019-01-06 ENCOUNTER — Other Ambulatory Visit: Payer: Self-pay

## 2019-01-06 VITALS — BP 139/70 | HR 84 | Temp 98.2°F | Resp 20 | Ht 60.0 in | Wt 135.0 lb

## 2019-01-06 DIAGNOSIS — H65193 Other acute nonsuppurative otitis media, bilateral: Secondary | ICD-10-CM

## 2019-01-06 MED ORDER — HYDROXYZINE HCL 25 MG PO TABS: ORAL_TABLET | ORAL | 0 refills | Status: DC

## 2019-01-06 MED ORDER — HYDROXYZINE HCL 25 MG PO TABS: 25.0000 mg | ORAL_TABLET | Freq: Three times a day (TID) | ORAL | 0 refills | Status: DC | PRN

## 2019-01-06 NOTE — Progress Notes (Signed)
   Subjective:    Patient ID: Susan Allison, female    DOB: 05-11-1960, 58 y.o.   MRN: [id number]  HPI Susan Allison is her today for c/o of left ear pain and fullness x 1 1/2 weeks. She reports she's taking Zyrtec at night with little to no relief. She reports she has had occasional dizziness. She denies any sore throat, nasal congestion, lymph swelling or or respiratory symptoms. Covid screening negative and denies fever.    Review of Systems  Constitutional: Negative for fatigue and fever.  HENT: Positive for ear pain and postnasal drip. Negative for sneezing.        Reports she feels her ears are muffled  Respiratory: Negative for cough and shortness of breath.   Cardiovascular: Negative for chest pain.  Gastrointestinal: Negative for abdominal pain.       Objective:   Physical Exam Constitutional:      Appearance: Normal appearance.  HENT:     Head: Normocephalic and atraumatic.     Right Ear: Ear canal normal.     Left Ear: Ear canal normal.     Ears:     Comments: Bilateral TM intact with no erythema. A lot of serous fluid with bubbles bilaterally    Nose: Nose normal.     Mouth/Throat:     Mouth: Mucous membranes are moist.     Pharynx: Oropharynx is clear.  Cardiovascular:     Rate and Rhythm: Normal rate and regular rhythm.     Pulses: Normal pulses.     Heart sounds: Normal heart sounds.  Pulmonary:     Effort: Pulmonary effort is normal.     Breath sounds: Normal breath sounds.  Musculoskeletal:     Cervical back: Normal range of motion and neck supple. No tenderness.  Lymphadenopathy:     Cervical: No cervical adenopathy.  Skin:    General: Skin is warm and dry.  Neurological:     Mental Status: She is alert.  Psychiatric:        Mood and Affect: Mood normal.        Behavior: Behavior normal.           Assessment & Plan:

## 2019-01-06 NOTE — Patient Instructions (Signed)
Take Allegra in the morning and the prescription Hydroxyzine at night (hold Zyrtec) Increase your fluids If no improvement after 1 week call office

## 2019-02-08 DIAGNOSIS — Z01818 Encounter for other preprocedural examination: Secondary | ICD-10-CM | POA: Diagnosis not present

## 2019-02-11 DIAGNOSIS — K5730 Diverticulosis of large intestine without perforation or abscess without bleeding: Secondary | ICD-10-CM | POA: Diagnosis not present

## 2019-02-11 DIAGNOSIS — K640 First degree hemorrhoids: Secondary | ICD-10-CM | POA: Diagnosis not present

## 2019-02-11 DIAGNOSIS — Z1211 Encounter for screening for malignant neoplasm of colon: Secondary | ICD-10-CM | POA: Diagnosis not present

## 2019-02-11 LAB — HM COLONOSCOPY

## 2019-02-15 ENCOUNTER — Ambulatory Visit: Payer: BC Managed Care – PPO | Admitting: Neurology

## 2019-02-15 ENCOUNTER — Encounter: Payer: Self-pay | Admitting: Neurology

## 2019-02-15 ENCOUNTER — Other Ambulatory Visit: Payer: Self-pay

## 2019-02-15 VITALS — BP 147/82 | HR 72 | Temp 96.8°F | Ht 60.0 in | Wt 134.0 lb

## 2019-02-15 DIAGNOSIS — E538 Deficiency of other specified B group vitamins: Secondary | ICD-10-CM | POA: Diagnosis not present

## 2019-02-15 DIAGNOSIS — G1229 Other motor neuron disease: Secondary | ICD-10-CM

## 2019-02-15 DIAGNOSIS — R292 Abnormal reflex: Secondary | ICD-10-CM

## 2019-02-15 DIAGNOSIS — M542 Cervicalgia: Secondary | ICD-10-CM

## 2019-02-15 DIAGNOSIS — R258 Other abnormal involuntary movements: Secondary | ICD-10-CM

## 2019-02-15 DIAGNOSIS — G959 Disease of spinal cord, unspecified: Secondary | ICD-10-CM

## 2019-02-15 NOTE — Patient Instructions (Signed)
MRI cervical spine Repeat Labwork PT for legs and neck

## 2019-02-15 NOTE — Progress Notes (Signed)
[facility]    Provider:  Dr Lucia Gaskins Requesting Provider: McLean-Scocuzza, French Ana * Primary Care Provider:  McLean-Scocuzza, Pasty Spillers, MD  CC:  Neck pain and leg weakness  HPI:  Susan Allison is a 59 y.o. female here as requested by McLean-Scocuzza, French Ana * for weakness of both extremities  PMHx HTN, HLD, prediabetes . If she is standing her legs get very weak and she has to sit down and sitting down helps. Started a year ago sporadically and noticing it more recently. She also has neck spasms it feels like she has spasms in the neck and getting up makes it go away. This is completely different than the legs.   Legs: The symptoms are not bad, she can stand and walk for long periods. She can lift legs, no problem going up stairs, she does not drag her feet, no numbness or tingling, no weakness in the arms, no problems lifting arms overhead, she has never fallen because of her leg weakness. She has been exercising her legs walking fast, she can walk very far, she has lower back pain, no radicular symptoms, the back pain is muscle pain and worse with poor posture. Do not occur often, from "time to time" and light. Not progressive, stable  Neck spasms: started a few years ago, only happens when sleeping or still for a long period of time, when she wake up and change positions it goes away, The pain is in the back of the head and feels very tight like a ball and when she moves her head it releases. She can roll over or even reposition head on the pillow and the symptoms resolve. No weakness in the arms. No shooting pains/radicular symptoms down the arms, 4/7 nights, denies headache. No physical therapy, not progressive.   Other: she is fatigued, no changes in bowel or bladder, no falls. No changes in vision, swallowing or speaking problems, no significant weight loss. No other focal neurologic deficits, associated symptoms, inciting events or modifiable factors.  Reviewed notes, labs  and imaging from outside physicians, which showed:  XR cervical spine personally reviewed imaging and agree: 1. Straightening of the cervical spine, usually due to positioning and/or muscle spasm. 2. Minimal degenerative disc disease in the lower cervical spine. 3. Minimal retrolisthesis at C5-6.  Review of Systems: Patient complains of symptoms per HPI as well as the following symptoms: low back pain. Pertinent negatives and positives per HPI. All others negative.   Social History   Socioeconomic History  . Marital status: Married    Spouse name: Not on file  . Number of children: 2  . Years of education: Not on file  . Highest education level: Bachelor's degree (e.g., BA, AB, BS)  Occupational History  . Not on file  Tobacco Use  . Smoking status: Never Smoker  . Smokeless tobacco: Never Used  Substance and Sexual Activity  . Alcohol use: Never  . Drug use: Never  . Sexual activity: Yes    Birth control/protection: Post-menopausal  Other Topics Concern  . Not on file  Social History Narrative   Married.   2 children. 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    Lives at home with spouse & children   Left handed   Caffeine: no   Social Determinants of Health   Financial Resource Strain:   . Difficulty of Paying Living Expenses: Not on file  Food Insecurity:   . Worried About Programme researcher, broadcasting/film/video in the Last Year: Not on file  . Ran Out of Food in the Last Year: Not on file  Transportation Needs:   . Lack of Transportation (Medical): Not on file  . Lack of Transportation (Non-Medical): Not on file  Physical Activity:   . Days of Exercise per Week: Not on file  . Minutes of Exercise per Session: Not on file  Stress:   . Feeling of Stress : Not on file  Social Connections:   . Frequency of Communication with Friends and Family: Not on file  . Frequency of Social Gatherings with  Friends and Family: Not on file  . Attends Religious Services: Not on file  . Active Member of Clubs or Organizations: Not on file  . Attends Banker Meetings: Not on file  . Marital Status: Not on file  Intimate Partner Violence:   . Fear of Current or Ex-Partner: Not on file  . Emotionally Abused: Not on file  . Physically Abused: Not on file  . Sexually Abused: Not on file    Family History  Problem Relation Age of Onset  . Breast cancer Other        40's  . Hypertension Mother   . Liver disease Mother   . Other Mother        brain tumor   . Prostate cancer Father 28  . Diabetes Brother   . Arthritis/Rheumatoid Sister   . Lupus Maternal Aunt   . Heart attack Maternal Grandmother     Past Medical History:  Diagnosis Date  . Arthritis   . GERD (gastroesophageal reflux disease)   . Hypertension   . Joint pain   . Mastodynia   . Sinus problem     Patient Active Problem List   Diagnosis Date Noted  . Prediabetes 09/07/2018  . HLD (hyperlipidemia) 09/07/2018  . Low back pain 05/19/2018  . Pain of left hand 06/25/2017  . Gallstones 06/02/2017  . Liver function test abnormality 06/02/2017  . Hypertension 05/08/2017    Past Surgical History:  Procedure Laterality Date  . COLONOSCOPY  2011  . ESOPHAGOGASTRODUODENOSCOPY N/A 08/14/2014   Procedure: ESOPHAGOGASTRODUODENOSCOPY (EGD);  Surgeon: [name], MD;  Location: [facility] END[facility]COPY;  Service: Gastroenterology;  Laterality: N/A;    Current Outpatient Medications  Medication Sig Dispense Refill  . amLODipine (NORVASC) 2.5 MG tablet Take 1 tablet (2.5 mg total) by mouth daily at 6 PM. 90 tablet 3  . azelastine (ASTELIN) 0.1 % nasal spray Place 2 sprays into both nostrils as needed. Use in each nostril as directed     . calcium carbonate (OS-CAL) 600 MG TABS tablet Take 600 mg by mouth daily.     . cetirizine (ZYRTEC) 10 MG tablet Take 10 mg by mouth as needed.     . cholecalciferol (VITAMIN D) 1000 units  tablet Take 1,000 Units by mouth daily.    . hydrochlorothiazide (HYDRODIURIL) 12.5 MG tablet Take 1 tablet (12.5 mg total) by mouth daily. In the am 90 tablet 3   No current facility-administered medications for this visit.    Allergies as of 02/15/2019 - Review Complete 02/15/2019  Allergen Reaction Noted  . Lisinopril  06/03/2017    Vitals: BP (!) 147/82 (BP Location: Left Arm, Patient Position: Sitting)   Pulse 72   Temp (!) 96.8 F (36 C) Comment: taken at front  Ht 5' (1.524 m)   Wt 134 lb (60.8 kg)  BMI 26.17 kg/m  Last Weight:  Wt Readings from Last 1 Encounters:  02/15/19 134 lb (60.8 kg)   Last Height:   Ht Readings from Last 1 Encounters:  02/15/19 5' (1.524 m)     Physical exam: Exam: Gen: NAD, conversant, well nourised,  well groomed                     CV: RRR, no MRG. No Carotid Bruits. No peripheral edema, warm, nontender Eyes: Conjunctivae clear without exudates or hemorrhage  Neuro: Detailed Neurologic Exam  Speech:    Speech is normal; fluent and spontaneous with normal comprehension.  Cognition:    The patient is oriented to person, place, and time;     recent and remote memory intact;     language fluent;     normal attention, concentration,     fund of knowledge Cranial Nerves:    The pupils are equal, round, and reactive to light. Fundi are flat. Visual fields are full to finger confrontation. Extraocular movements are intact. Trigeminal sensation is intact and the muscles of mastication are normal. The face is symmetric. The palate elevates in the midline. Hearing intact. Voice is normal. Shoulder shrug is normal. The tongue has normal motion without fasciculations.   Coordination:    Normal finger to nose and heel to shin. Normal rapid alternating movements.   Gait:    Heel-toe and tandem gait are normal.   Motor Observation:    No asymmetry, no atrophy, and no involuntary movements noted. Tone:    Normal muscle tone.     Posture:    Posture is normal. normal erect    Strength:    Strength is V/V in the upper and lower limbs.      Sensation: intact to LT     Reflex Exam:  DTR's:    Deep tendon reflexes in the upper and lower extremities are brisk bilaterally.   Toes:    The toes are downgoing bilaterally.   Clonus:      + hoffman's right, 3 beats clonus right AJ    Assessment/Plan: This is a really lovely 66-year-old patient here for weakness in the legs, neck pain.  Her exam is concerning for clonus and hyperreflexia in addition positive Hoffmann sign I think it would be prudent to image her cervical spinal cord for cervical myelopathy or spinal cord lesion.  Also despite having B12 in the normal range, it was extremely low, and B12 less than 400 albeit technically normal can still represent B12 deficiency;  I will recheck her B12 today as well, B12 deficiency can cause lesions in the spinal cord as well such as subacute combined degeneration.  MRI cervical spine Repeat B12 Physical therapy [place] with dry needling for muscle cramps in the neck: Chronic musculoskeletal neck pain, evaluate and treat, modalities such as stretching, strengthening, TENS unit and dry needling as appropriate.  Orders Placed This Encounter  Procedures  . MR CERVICAL SPINE WO CONTRAST  . B12 and Folate Panel  . Methylmalonic acid, serum  . Ambulatory referral to Physical Therapy   No orders of the defined types were placed in this encounter.   Cc: McLean-Scocuzza, Catheryn Bacon, MD  [facility] [address] [place], [place] [address]  Phone [phone] Fax [phone]

## 2019-02-19 LAB — METHYLMALONIC ACID, SERUM: Methylmalonic Acid: 145 nmol/L (ref 0–378)

## 2019-02-19 LAB — B12 AND FOLATE PANEL
Folate: 13 ng/mL (ref >3.0)
Vitamin B-12: 478 pg/mL (ref 232–1245)

## 2019-03-28 NOTE — Progress Notes (Signed)
PCP: McLean-Scocuzza, Nino Glow, MD   Chief Complaint  Patient presents with  . Gynecologic Exam    HPI:      [name] is a 59 y.o. [id number] who LMP was No LMP recorded. Patient is postmenopausal., presents today for her annual examination.  Her menses are absent due to menopause. She does not have intermenstrual bleeding. She does not have vasomotor sx.   Sex activity: Not sex active. She does not have vaginal dryness.  Last Pap: 03/25/18  Results were: no abnormalities /neg HPV DNA.  Hx of STDs: none  Last mammogram: 07/23/18 Results were: normal--routine follow-up in 12 months There is a FH of breast cancer in her mat niece, genetic testing not indicated for pt. There is no FH of ovarian cancer. The patient does do self-breast exams.  Colonoscopy: colonoscopy 1/21 at [facility] without abnormalities.  Repeat due after 10 years per pt  Tobacco use: The patient denies current or previous tobacco use. Alcohol use: none  No drug use Exercise: min active  She does get adequate calcium and Vitamin D in her diet.  Labs with PCP.    Past Medical History:  Diagnosis Date  . Arthritis   . GERD (gastroesophageal reflux disease)   . Hypertension   . Joint pain   . Mastodynia   . Sinus problem     Past Surgical History:  Procedure Laterality Date  . COLONOSCOPY  2011  . ESOPHAGOGASTRODUODENOSCOPY N/A 08/14/2014   Procedure: ESOPHAGOGASTRODUODENOSCOPY (EGD);  Surgeon: Hulen Luster, MD;  Location: [facility] ENDOSCOPY;  Service: Gastroenterology;  Laterality: N/A;    Family History  Problem Relation Age of Onset  . Breast cancer Other        41s, has contact  . Hypertension Mother   . Liver disease Mother   . Other Mother        brain tumor   . Prostate cancer Father 26  . Diabetes Brother   . Arthritis/Rheumatoid Sister   . Lupus Maternal Aunt   . Heart attack Maternal Grandmother     Social History   Socioeconomic History  . Marital status: Married    Spouse name:  Not on file  . Number of children: 2  . Years of education: Not on file  . Highest education level: Bachelor's degree (e.g., BA, AB, BS)  Occupational History  . Not on file  Tobacco Use  . Smoking status: Never Smoker  . Smokeless tobacco: Never Used  Substance and Sexual Activity  . Alcohol use: Never  . Drug use: Never  . Sexual activity: Yes    Birth control/protection: Post-menopausal  Other Topics Concern  . Not on file  Social History Narrative   Married.   2 children. 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    Lives at home with spouse & children   Left handed   Caffeine: no   Social Determinants of Health   Financial Resource Strain:   . Difficulty of Paying Living Expenses: Not on file  Food Insecurity:   . Worried About Charity fundraiser in the Last Year: Not on file  . Ran Out of Food in the Last Year: Not on file  Transportation Needs:   . Lack of Transportation (Medical): Not on file  . Lack of Transportation (Non-Medical): Not on file  Physical Activity:   . Days of Exercise per  Week: Not on file  . Minutes of Exercise per Session: Not on file  Stress:   . Feeling of Stress : Not on file  Social Connections:   . Frequency of Communication with Friends and Family: Not on file  . Frequency of Social Gatherings with Friends and Family: Not on file  . Attends Religious Services: Not on file  . Active Member of Clubs or Organizations: Not on file  . Attends Banker Meetings: Not on file  . Marital Status: Not on file  Intimate Partner Violence:   . Fear of Current or Ex-Partner: Not on file  . Emotionally Abused: Not on file  . Physically Abused: Not on file  . Sexually Abused: Not on file    Current Meds  Medication Sig  . amLODipine (NORVASC) 2.5 MG tablet Take 1 tablet (2.5 mg total) by mouth daily at 6 PM.  . azelastine (ASTELIN) 0.1 % nasal spray  Place 2 sprays into both nostrils as needed. Use in each nostril as directed   . calcium carbonate (OS-CAL) 600 MG TABS tablet Take 600 mg by mouth daily.   . cetirizine (ZYRTEC) 10 MG tablet Take 10 mg by mouth as needed.   . cholecalciferol (VITAMIN D) 1000 units tablet Take 1,000 Units by mouth daily.  . hydrochlorothiazide (HYDRODIURIL) 12.5 MG tablet Take 1 tablet (12.5 mg total) by mouth daily. In the am      ROS:  Review of Systems  Constitutional: Negative for fatigue, fever and unexpected weight change.  Respiratory: Negative for cough, shortness of breath and wheezing.   Cardiovascular: Negative for chest pain, palpitations and leg swelling.  Gastrointestinal: Negative for blood in stool, constipation, diarrhea, nausea and vomiting.  Endocrine: Negative for cold intolerance, heat intolerance and polyuria.  Genitourinary: Negative for dyspareunia, dysuria, flank pain, frequency, genital sores, hematuria, menstrual problem, pelvic pain, urgency, vaginal bleeding, vaginal discharge and vaginal pain.  Musculoskeletal: Positive for arthralgias. Negative for back pain, joint swelling and myalgias.  Skin: Negative for rash.  Neurological: Negative for dizziness, syncope, light-headedness, numbness and headaches.  Hematological: Negative for adenopathy.  Psychiatric/Behavioral: Negative for agitation, confusion, sleep disturbance and suicidal ideas. The patient is not nervous/anxious.      Objective: BP 136/70   Ht 4\' 11"  (1.499 m)   Wt 136 lb (61.7 kg)   BMI 27.47 kg/m    Physical Exam Constitutional:      Appearance: She is well-developed.  Genitourinary:     Vulva, vagina, cervix, uterus, right adnexa and left adnexa normal.     No vulval lesion or tenderness noted.     Vaginal atrophy present.     No vaginal discharge, erythema or tenderness.     No cervical polyp.     Uterus is not enlarged or tender.     No right or left adnexal mass present.     Right adnexa not  tender.     Left adnexa not tender.  Neck:     Thyroid: No thyromegaly.  Cardiovascular:     Rate and Rhythm: Normal rate and regular rhythm.     Heart sounds: Normal heart sounds. No murmur.  Pulmonary:     Effort: Pulmonary effort is normal.     Breath sounds: Normal breath sounds.  Chest:     Breasts:        Right: No mass, nipple discharge, skin change or tenderness.        Left: No mass, nipple discharge, skin change or  tenderness.  Abdominal:     Palpations: Abdomen is soft.     Tenderness: There is no abdominal tenderness. There is no guarding.  Musculoskeletal:        General: Normal range of motion.     Cervical back: Normal range of motion.  Neurological:     General: No focal deficit present.     Mental Status: She is alert and oriented to person, place, and time.     Cranial Nerves: No cranial nerve deficit.  Skin:    General: Skin is warm and dry.  Psychiatric:        Mood and Affect: Mood normal.        Behavior: Behavior normal.        Thought Content: Thought content normal.        Judgment: Judgment normal.  Vitals reviewed.    Assessment/Plan:  Encounter for annual routine gynecological examination  Encounter for screening mammogram for malignant neoplasm of breast - Plan: MM 3D SCREEN BREAST BILATERAL;l pt to sched mammo  Screening for colon cancer--current on colonoscopy; repeat due after 10 yrs         GYN counsel breast self exam, mammography screening, menopause, adequate intake of calcium and vitamin D, diet and exercise    F/U  Return in about 1 year (around 03/28/2020).  Davion Flannery B. Bernabe Dorce, PA-C 03/29/2019 10:32 AM

## 2019-03-29 ENCOUNTER — Encounter: Payer: Self-pay | Admitting: Obstetrics and Gynecology

## 2019-03-29 ENCOUNTER — Other Ambulatory Visit: Payer: Self-pay

## 2019-03-29 ENCOUNTER — Ambulatory Visit (INDEPENDENT_AMBULATORY_CARE_PROVIDER_SITE_OTHER): Payer: BC Managed Care – PPO | Admitting: Obstetrics and Gynecology

## 2019-03-29 VITALS — BP 136/70 | Ht 59.0 in | Wt 136.0 lb

## 2019-03-29 DIAGNOSIS — Z01419 Encounter for gynecological examination (general) (routine) without abnormal findings: Secondary | ICD-10-CM

## 2019-03-29 DIAGNOSIS — Z1231 Encounter for screening mammogram for malignant neoplasm of breast: Secondary | ICD-10-CM

## 2019-03-29 DIAGNOSIS — Z1211 Encounter for screening for malignant neoplasm of colon: Secondary | ICD-10-CM

## 2019-03-29 NOTE — Patient Instructions (Addendum)
I value your feedback and entrusting us with your care. If you get a [facility] patient survey, I would appreciate you taking the time to let us know about your experience today. Thank you! ° °As of January 06, 2019, your lab results will be released to your MyChart immediately, before I even have a chance to see them. Please give me time to review them and contact you if there are any abnormalities. Thank you for your patience.  ° °[facility]: [phone] ° ° ° °

## 2019-04-08 ENCOUNTER — Ambulatory Visit: Payer: BC Managed Care – PPO | Attending: Internal Medicine

## 2019-04-08 DIAGNOSIS — Z23 Encounter for immunization: Secondary | ICD-10-CM

## 2019-04-08 NOTE — Progress Notes (Signed)
   Covid-19 Vaccination Clinic  Name:  Susan Allison    MRN: [id number] DOB: 10-Dec-1960  04/08/2019  Susan Allison was observed post Covid-19 immunization for 15 minutes without incident. She was provided with Vaccine Information Sheet and instruction to access the V-Safe system.   Susan Allison was instructed to call 911 with any severe reactions post vaccine: Marland Kitchen Difficulty breathing  . Swelling of face and throat  . A fast heartbeat  . A bad rash all over body  . Dizziness and weakness   Immunizations Administered    Name Date Dose VIS Date Route   Pfizer COVID-19 Vaccine 04/08/2019  9:44 AM 0.3 mL 01/07/2019 Intramuscular   Manufacturer: [employer], [employer]   Lot: [id number]   NDC: [address]-9

## 2019-04-11 ENCOUNTER — Other Ambulatory Visit: Payer: Self-pay

## 2019-04-13 ENCOUNTER — Ambulatory Visit: Payer: BC Managed Care – PPO | Admitting: Internal Medicine

## 2019-04-22 ENCOUNTER — Other Ambulatory Visit: Payer: Self-pay | Admitting: Internal Medicine

## 2019-04-22 ENCOUNTER — Encounter: Payer: Self-pay | Admitting: Internal Medicine

## 2019-04-22 ENCOUNTER — Ambulatory Visit: Payer: BC Managed Care – PPO | Admitting: Internal Medicine

## 2019-04-22 ENCOUNTER — Other Ambulatory Visit: Payer: Self-pay

## 2019-04-22 VITALS — BP 122/78 | HR 67 | Temp 97.4°F | Ht 59.0 in | Wt 136.2 lb

## 2019-04-22 DIAGNOSIS — H6983 Other specified disorders of Eustachian tube, bilateral: Secondary | ICD-10-CM

## 2019-04-22 DIAGNOSIS — I10 Essential (primary) hypertension: Secondary | ICD-10-CM

## 2019-04-22 DIAGNOSIS — Z1329 Encounter for screening for other suspected endocrine disorder: Secondary | ICD-10-CM

## 2019-04-22 DIAGNOSIS — K219 Gastro-esophageal reflux disease without esophagitis: Secondary | ICD-10-CM

## 2019-04-22 DIAGNOSIS — K8020 Calculus of gallbladder without cholecystitis without obstruction: Secondary | ICD-10-CM

## 2019-04-22 DIAGNOSIS — Z1389 Encounter for screening for other disorder: Secondary | ICD-10-CM

## 2019-04-22 DIAGNOSIS — R7303 Prediabetes: Secondary | ICD-10-CM

## 2019-04-22 MED ORDER — FLUTICASONE PROPIONATE 50 MCG/ACT NA SUSP: 1.0000 | Freq: Every day | NASAL | 11 refills | Status: DC

## 2019-04-22 MED ORDER — AMLODIPINE BESYLATE 2.5 MG PO TABS: 2.5000 mg | ORAL_TABLET | Freq: Every day | ORAL | 3 refills | Status: DC

## 2019-04-22 MED ORDER — HYDROCHLOROTHIAZIDE 12.5 MG PO TABS: 12.5000 mg | ORAL_TABLET | Freq: Every day | ORAL | 3 refills | Status: DC

## 2019-04-22 NOTE — Patient Instructions (Addendum)
Tums, rolaids or Pepcid for heartburn   05/2017 FINDINGS: Gallbladder:  Dependent calculi in gallbladder up to 11 mm diameter. No gallbladder wall thickening, pericholecystic fluid or sonographic Murphy sign.  Common bile duct:  Diameter: 3 mm diameter, normal  Liver:  Upper normal echogenicity. No attic mass or nodularity. Portal vein is patent on color Doppler imaging with normal direction of blood flow towards the liver.  No RIGHT upper quadrant free fluid.  IMPRESSION: Cholelithiasis without evidence acute cholecystitis.   Electronically Signed    Cholelithiasis  Cholelithiasis is a form of gallbladder disease in which gallstones form in the gallbladder. The gallbladder is an organ that stores bile. Bile is made in the liver, and it helps to digest fats. Gallstones begin as small crystals and slowly grow into stones. They may cause no symptoms until the gallbladder tightens (contracts) and a gallstone is blocking the duct (gallbladder attack), which can cause pain. Cholelithiasis is also referred to as gallstones. There are two main types of gallstones:  Cholesterol stones. These are made of hardened cholesterol and are usually yellow-green in color. They are the most common type of gallstone. Cholesterol is a white, waxy, fat-like substance that is made in the liver.  Pigment stones. These are dark in color and are made of a red-yellow substance that forms when hemoglobin from red blood cells breaks down (bilirubin). What are the causes? This condition may be caused by an imbalance in the substances that bile is made of. This can happen if the bile:  Has too much bilirubin.  Has too much cholesterol.  Does not have enough bile salts. These salts help the body absorb and digest fats. In some cases, this condition can also be caused by the gallbladder not emptying completely or often enough. What increases the risk? The following factors may make you more  likely to develop this condition:  Being female.  Having multiple pregnancies. Health care providers sometimes advise removing diseased gallbladders before future pregnancies.  Eating a diet that is heavy in fried foods, fat, and refined carbohydrates, like white bread and white rice.  Being obese.  Being older than age 29.  Prolonged use of medicines that contain female hormones (estrogen).  Having diabetes mellitus.  Rapidly losing weight.  Having a family history of gallstones.  Being of [place] or [place] descent.  Having an intestinal disease such as Crohn disease.  Having metabolic syndrome.  Having cirrhosis.  Having severe types of anemia such as sickle cell anemia. What are the signs or symptoms? In most cases, there are no symptoms. These are known as silent gallstones. If a gallstone blocks the bile ducts, it can cause a gallbladder attack. The main symptom of a gallbladder attack is sudden pain in the upper right abdomen. The pain usually comes at night or after eating a large meal. The pain can last for one or several hours and can spread to the right shoulder or chest. If the bile duct is blocked for more than a few hours, it can cause infection or inflammation of the gallbladder, liver, or pancreas, which may cause:  Nausea.  Vomiting.  Abdominal pain that lasts for 5 hours or more.  Fever or chills.  Yellowing of the skin or the whites of the eyes (jaundice).  Dark urine.  Light-colored stools. How is this diagnosed? This condition may be diagnosed based on:  A physical exam.  Your medical history.  An ultrasound of your gallbladder.  CT scan.  MRI.  Blood tests to check for signs of infection or inflammation.  A scan of your gallbladder and bile ducts (biliary system) using nonharmful radioactive material and special cameras that can see the radioactive material (cholescintigram). This test checks to see how your gallbladder  contracts and whether bile ducts are blocked.  Inserting a small tube with a camera on the end (endoscope) through your mouth to inspect bile ducts and check for blockages (endoscopic retrograde cholangiopancreatogram). How is this treated? Treatment for gallstones depends on the severity of the condition. Silent gallstones do not need treatment. If the gallstones cause a gallbladder attack or other symptoms, treatment may be required. Options for treatment include:  Surgery to remove the gallbladder (cholecystectomy). This is the most common treatment.  Medicines to dissolve gallstones. These are most effective at treating small gallstones. You may need to take medicines for up to 6-12 months.  Shock wave treatment (extracorporeal biliary lithotripsy). In this treatment, an ultrasound machine sends shock waves to the gallbladder to break gallstones into smaller pieces. These pieces can then be passed into the intestines or be dissolved by medicine. This is rarely used.  Removing gallstones through endoscopic retrograde cholangiopancreatogram. A small basket can be attached to the endoscope and used to capture and remove gallstones. Follow these instructions at home:  Take over-the-counter and prescription medicines only as told by your health care provider.  Maintain a healthy weight and follow a healthy diet. This includes: ? Reducing fatty foods, such as fried food. ? Reducing refined carbohydrates, like white bread and white rice. ? Increasing fiber. Aim for foods like almonds, fruit, and beans.  Keep all follow-up visits as told by your health care provider. This is important. Contact a health care provider if:  You think you have had a gallbladder attack.  You have been diagnosed with silent gallstones and you develop abdominal pain or indigestion. Get help right away if:  You have pain from a gallbladder attack that lasts for more than 2 hours.  You have abdominal pain that  lasts for more than 5 hours.  You have a fever or chills.  You have persistent nausea and vomiting.  You develop jaundice.  You have dark urine or light-colored stools. Summary  Cholelithiasis (also called gallstones) is a form of gallbladder disease in which gallstones form in the gallbladder.  This condition is caused by an imbalance in the substances that make up bile. This can happen if the bile has too much cholesterol, too much bilirubin, or not enough bile salts.  You are more likely to develop this condition if you are female, pregnant, using medicines with estrogen, obese, older than age 54, or have a family history of gallstones. You may also develop gallstones if you have diabetes, an intestinal disease, cirrhosis, or metabolic syndrome.  Treatment for gallstones depends on the severity of the condition. Silent gallstones do not need treatment.  If gallstones cause a gallbladder attack or other symptoms, treatment may be needed. The most common treatment is surgery to remove the gallbladder. This information is not intended to replace advice given to you by your health care provider. Make sure you discuss any questions you have with your health care provider. Document Revised: 12/26/2016 Document Reviewed: 09/30/2015 Elsevier Patient Education  2020 [employer]  Gastroesophageal Reflux Disease, Adult Gastroesophageal reflux (GER) happens when acid from the stomach flows up into the tube that connects the mouth and the stomach (esophagus). Normally, food travels down the esophagus and stays in the  stomach to be digested. However, when a person has GER, food and stomach acid sometimes move back up into the esophagus. If this becomes a more serious problem, the person may be diagnosed with a disease called gastroesophageal reflux disease (GERD). GERD occurs when the reflux:  Happens often.  Causes frequent or severe symptoms.  Causes problems such as damage to the  esophagus. When stomach acid comes in contact with the esophagus, the acid may cause soreness (inflammation) in the esophagus. Over time, GERD may create small holes (ulcers) in the lining of the esophagus. What are the causes? This condition is caused by a problem with the muscle between the esophagus and the stomach (lower esophageal sphincter, or LES). Normally, the LES muscle closes after food passes through the esophagus to the stomach. When the LES is weakened or abnormal, it does not close properly, and that allows food and stomach acid to go back up into the esophagus. The LES can be weakened by certain dietary substances, medicines, and medical conditions, including:  Tobacco use.  Pregnancy.  Having a hiatal hernia.  Alcohol use.  Certain foods and beverages, such as coffee, chocolate, onions, and peppermint. What increases the risk? You are more likely to develop this condition if you:  Have an increased body weight.  Have a connective tissue disorder.  Use NSAID medicines. What are the signs or symptoms? Symptoms of this condition include:  Heartburn.  Difficult or painful swallowing.  The feeling of having a lump in the throat.  Abitter taste in the mouth.  Bad breath.  Having a large amount of saliva.  Having an upset or bloated stomach.  Belching.  Chest pain. Different conditions can cause chest pain. Make sure you see your health care provider if you experience chest pain.  Shortness of breath or wheezing.  Ongoing (chronic) cough or a night-time cough.  Wearing away of tooth enamel.  Weight loss. How is this diagnosed? Your health care provider will take a medical history and perform a physical exam. To determine if you have mild or severe GERD, your health care provider may also monitor how you respond to treatment. You may also have tests, including:  A test to examine your stomach and esophagus with a small camera (endoscopy).  A test  thatmeasures the acidity level in your esophagus.  A test thatmeasures how much pressure is on your esophagus.  A barium swallow or modified barium swallow test to show the shape, size, and functioning of your esophagus. How is this treated? The goal of treatment is to help relieve your symptoms and to prevent complications. Treatment for this condition may vary depending on how severe your symptoms are. Your health care provider may recommend:  Changes to your diet.  Medicine.  Surgery. Follow these instructions at home: Eating and drinking   Follow a diet as recommended by your health care provider. This may involve avoiding foods and drinks such as: ? Coffee and tea (with or without caffeine). ? Drinks that containalcohol. ? Energy drinks and sports drinks. ? Carbonated drinks or sodas. ? Chocolate and cocoa. ? Peppermint and mint flavorings. ? Garlic and onions. ? Horseradish. ? Spicy and acidic foods, including peppers, chili powder, curry powder, vinegar, hot sauces, and barbecue sauce. ? Citrus fruit juices and citrus fruits, such as oranges, lemons, and limes. ? Tomato-based foods, such as red sauce, chili, salsa, and pizza with red sauce. ? Fried and fatty foods, such as donuts, french fries, potato chips, and high-fat  dressings. ? High-fat meats, such as hot dogs and fatty cuts of red and white meats, such as rib eye steak, sausage, ham, and bacon. ? High-fat dairy items, such as whole milk, butter, and cream cheese.  Eat small, frequent meals instead of large meals.  Avoid drinking large amounts of liquid with your meals.  Avoid eating meals during the 2-3 hours before bedtime.  Avoid lying down right after you eat.  Do not exercise right after you eat. Lifestyle   Do not use any products that contain nicotine or tobacco, such as cigarettes, e-cigarettes, and chewing tobacco. If you need help quitting, ask your health care provider.  Try to reduce your  stress by using methods such as yoga or meditation. If you need help reducing stress, ask your health care provider.  If you are overweight, reduce your weight to an amount that is healthy for you. Ask your health care provider for guidance about a safe weight loss goal. General instructions  Pay attention to any changes in your symptoms.  Take over-the-counter and prescription medicines only as told by your health care provider. Do not take aspirin, ibuprofen, or other NSAIDs unless your health care provider told you to do so.  Wear loose-fitting clothing. Do not wear anything tight around your waist that causes pressure on your abdomen.  Raise (elevate) the head of your bed about 6 inches (15 cm).  Avoid bending over if this makes your symptoms worse.  Keep all follow-up visits as told by your health care provider. This is important. Contact a health care provider if:  You have: ? New symptoms. ? Unexplained weight loss. ? Difficulty swallowing or it hurts to swallow. ? Wheezing or a persistent cough. ? A hoarse voice.  Your symptoms do not improve with treatment. Get help right away if you:  Have pain in your arms, neck, jaw, teeth, or back.  Feel sweaty, dizzy, or light-headed.  Have chest pain or shortness of breath.  Vomit and your vomit looks like blood or coffee grounds.  Faint.  Have stool that is bloody or black.  Cannot swallow, drink, or eat. Summary  Gastroesophageal reflux happens when acid from the stomach flows up into the esophagus. GERD is a disease in which the reflux happens often, causes frequent or severe symptoms, or causes problems such as damage to the esophagus.  Treatment for this condition may vary depending on how severe your symptoms are. Your health care provider may recommend diet and lifestyle changes, medicine, or surgery.  Contact a health care provider if you have new or worsening symptoms.  Take over-the-counter and prescription  medicines only as told by your health care provider. Do not take aspirin, ibuprofen, or other NSAIDs unless your health care provider told you to do so.  Keep all follow-up visits as told by your health care provider. This is important. This information is not intended to replace advice given to you by your health care provider. Make sure you discuss any questions you have with your health care provider. Document Revised: 07/22/2017 Document Reviewed: 07/22/2017 Elsevier Patient Education  2020 [employer].  Food Choices for Gastroesophageal Reflux Disease, Adult When you have gastroesophageal reflux disease (GERD), the foods you eat and your eating habits are very important. Choosing the right foods can help ease the discomfort of GERD. Consider working with a diet and nutrition specialist (dietitian) to help you make healthy food choices. What general guidelines should I follow?  Eating plan  Choose healthy  foods low in fat, such as fruits, vegetables, whole grains, low-fat dairy products, and lean meat, fish, and poultry.  Eat frequent, small meals instead of three large meals each day. Eat your meals slowly, in a relaxed setting. Avoid bending over or lying down until 2-3 hours after eating.  Limit high-fat foods such as fatty meats or fried foods.  Limit your intake of oils, butter, and shortening to less than 8 teaspoons each day.  Avoid the following: ? Foods that cause symptoms. These may be different for different people. Keep a food diary to keep track of foods that cause symptoms. ? Alcohol. ? Drinking large amounts of liquid with meals. ? Eating meals during the 2-3 hours before bed.  Cook foods using methods other than frying. This may include baking, grilling, or broiling. Lifestyle  Maintain a healthy weight. Ask your health care provider what weight is healthy for you. If you need to lose weight, work with your health care provider to do so safely.  Exercise for at  least 30 minutes on 5 or more days each week, or as told by your health care provider.  Avoid wearing clothes that fit tightly around your waist and chest.  Do not use any products that contain nicotine or tobacco, such as cigarettes and e-cigarettes. If you need help quitting, ask your health care provider.  Sleep with the head of your bed raised. Use a wedge under the mattress or blocks under the bed frame to raise the head of the bed. What foods are not recommended? The items listed may not be a complete list. Talk with your dietitian about what dietary choices are best for you. Grains Pastries or quick breads with added fat. [place] toast. Vegetables Deep fried vegetables. [place] fries. Any vegetables prepared with added fat. Any vegetables that cause symptoms. For some people this may include tomatoes and tomato products, chili peppers, onions and garlic, and horseradish. Fruits Any fruits prepared with added fat. Any fruits that cause symptoms. For some people this may include citrus fruits, such as oranges, grapefruit, pineapple, and lemons. Meats and other protein foods High-fat meats, such as fatty beef or pork, hot dogs, ribs, ham, sausage, salami and bacon. Fried meat or protein, including fried fish and fried chicken. Nuts and nut butters. Dairy Whole milk and chocolate milk. Sour cream. Cream. Ice cream. Cream cheese. Milk shakes. Beverages Coffee and tea, with or without caffeine. Carbonated beverages. Sodas. Energy drinks. Fruit juice made with acidic fruits (such as orange or grapefruit). Tomato juice. Alcoholic drinks. Fats and oils Butter. Margarine. Shortening. Ghee. Sweets and desserts Chocolate and cocoa. Donuts. Seasoning and other foods Pepper. Peppermint and spearmint. Any condiments, herbs, or seasonings that cause symptoms. For some people, this may include curry, hot sauce, or vinegar-based salad dressings. Summary  When you have gastroesophageal reflux disease  (GERD), food and lifestyle choices are very important to help ease the discomfort of GERD.  Eat frequent, small meals instead of three large meals each day. Eat your meals slowly, in a relaxed setting. Avoid bending over or lying down until 2-3 hours after eating.  Limit high-fat foods such as fatty meat or fried foods. This information is not intended to replace advice given to you by your health care provider. Make sure you discuss any questions you have with your health care provider. Document Revised: 05/06/2018 Document Reviewed: 01/15/2016 Elsevier Patient Education  2020 Else[employer] Inc.   Eustachian Tube Dysfunction  Eustachian tube dysfunction refers to a condition  in which a blockage develops in the narrow passage that connects the middle ear to the back of the nose (eustachian tube). The eustachian tube regulates air pressure in the middle ear by letting air move between the ear and nose. It also helps to drain fluid from the middle ear space. Eustachian tube dysfunction can affect one or both ears. When the eustachian tube does not function properly, air pressure, fluid, or both can build up in the middle ear. What are the causes? This condition occurs when the eustachian tube becomes blocked or cannot open normally. Common causes of this condition include:  Ear infections.  Colds and other infections that affect the nose, mouth, and throat (upper respiratory tract).  Allergies.  Irritation from cigarette smoke.  Irritation from stomach acid coming up into the esophagus (gastroesophageal reflux). The esophagus is the tube that carries food from the mouth to the stomach.  Sudden changes in air pressure, such as from descending in an airplane or scuba diving.  Abnormal growths in the nose or throat, such as: ? Growths that line the nose (nasal polyps). ? Abnormal growth of cells (tumors). ? Enlarged tissue at the back of the throat (adenoids). What increases the risk? You  are more likely to develop this condition if:  You smoke.  You are overweight.  You are a child who has: ? Certain birth defects of the mouth, such as cleft palate. ? Large tonsils or adenoids. What are the signs or symptoms? Common symptoms of this condition include:  A feeling of fullness in the ear.  Ear pain.  Clicking or popping noises in the ear.  Ringing in the ear.  Hearing loss.  Loss of balance.  Dizziness. Symptoms may get worse when the air pressure around you changes, such as when you travel to an area of high elevation, fly on an airplane, or go scuba diving. How is this diagnosed? This condition may be diagnosed based on:  Your symptoms.  A physical exam of your ears, nose, and throat.  Tests, such as those that measure: ? The movement of your eardrum (tympanogram). ? Your hearing (audiometry). How is this treated? Treatment depends on the cause and severity of your condition.  In mild cases, you may relieve your symptoms by moving air into your ears. This is called "popping the ears."  In more severe cases, or if you have symptoms of fluid in your ears, treatment may include: ? Medicines to relieve congestion (decongestants). ? Medicines that treat allergies (antihistamines). ? Nasal sprays or ear drops that contain medicines that reduce swelling (steroids). ? A procedure to drain the fluid in your eardrum (myringotomy). In this procedure, a small tube is placed in the eardrum to:  Drain the fluid.  Restore the air in the middle ear space. ? A procedure to insert a balloon device through the nose to inflate the opening of the eustachian tube (balloon dilation). Follow these instructions at home: Lifestyle  Do not do any of the following until your health care provider approves: ? Travel to high altitudes. ? Fly in airplanes. ? Work in a Estate agent or room. ? Scuba dive.  Do not use any products that contain nicotine or tobacco, such  as cigarettes and e-cigarettes. If you need help quitting, ask your health care provider.  Keep your ears dry. Wear fitted earplugs during showering and bathing. Dry your ears completely after. General instructions  Take over-the-counter and prescription medicines only as told by your health care  provider.  Use techniques to help pop your ears as recommended by your health care provider. These may include: ? Chewing gum. ? Yawning. ? Frequent, forceful swallowing. ? Closing your mouth, holding your nose closed, and gently blowing as if you are trying to blow air out of your nose.  Keep all follow-up visits as told by your health care provider. This is important. Contact a health care provider if:  Your symptoms do not go away after treatment.  Your symptoms come back after treatment.  You are unable to pop your ears.  You have: ? A fever. ? Pain in your ear. ? Pain in your head or neck. ? Fluid draining from your ear.  Your hearing suddenly changes.  You become very dizzy.  You lose your balance. Summary  Eustachian tube dysfunction refers to a condition in which a blockage develops in the eustachian tube.  It can be caused by ear infections, allergies, inhaled irritants, or abnormal growths in the nose or throat.  Symptoms include ear pain, hearing loss, or ringing in the ears.  Mild cases are treated with maneuvers to unblock the ears, such as yawning or ear popping.  Severe cases are treated with medicines. Surgery may also be done (rare). This information is not intended to replace advice given to you by your health care provider. Make sure you discuss any questions you have with your health care provider. Document Revised: 05/05/2017 Document Reviewed: 05/05/2017 Elsevier Patient Education  [place].

## 2019-04-22 NOTE — Progress Notes (Signed)
Chief Complaint  Patient presents with  . Follow-up  . Ear Problem    left ear occasional sharp pains along with ear fulness. pt states she is prone to ear infections.   . Medication Refill    HCTZ   F/u  1. HTN controlled on norvasc 2.5 mg qd hctz 12.5 mg qd  2. C/o b/l ear pain h/o left infection and h/o allergies on zyrtec at times pain sharp 6/10    Review of Systems  Constitutional: Negative for weight loss.  HENT: Positive for ear pain. Negative for hearing loss.   Eyes: Negative for blurred vision.  Respiratory: Negative for shortness of breath.   Cardiovascular: Negative for chest pain.  Gastrointestinal: Negative for abdominal pain.  Skin: Negative for rash.  Neurological: Negative for headaches.  Psychiatric/Behavioral: Negative for depression.   Past Medical History:  Diagnosis Date  . Arthritis   . GERD (gastroesophageal reflux disease)   . Hypertension   . Joint pain   . Mastodynia   . Sinus problem    Past Surgical History:  Procedure Laterality Date  . COLONOSCOPY  2011  . ESOPHAGOGASTRODUODENOSCOPY N/A 08/14/2014   Procedure: ESOPHAGOGASTRODUODENOSCOPY (EGD);  Surgeon: Hulen Luster, MD;  Location: [facility] ENDOSCOPY;  Service: Gastroenterology;  Laterality: N/A;   Family History  Problem Relation Age of Onset  . Breast cancer Other        51s, has contact  . Hypertension Mother   . Liver disease Mother   . Other Mother        brain tumor   . Prostate cancer Father 93  . Diabetes Brother   . Arthritis/Rheumatoid Sister   . Lupus Maternal Aunt   . Heart attack Maternal Grandmother    Social History   Socioeconomic History  . Marital status: Married    Spouse name: Not on file  . Number of children: 2  . Years of education: Not on file  . Highest education level: Bachelor's degree (e.g., BA, AB, BS)  Occupational History  . Not on file  Tobacco Use  . Smoking status: Never Smoker  . Smokeless tobacco: Never Used  Substance and Sexual Activity  .  Alcohol use: Never  . Drug use: Never  . Sexual activity: Yes    Birth control/protection: Post-menopausal  Other Topics Concern  . Not on file  Social History Narrative   Married.   2 children. 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    Lives at home with spouse & children   Left handed   Caffeine: no   Social Determinants of Health   Financial Resource Strain:   . Difficulty of Paying Living Expenses:   Food Insecurity:   . Worried About Charity fundraiser in the Last Year:   . Arboriculturist in the Last Year:   Transportation Needs:   . Film/video editor (Medical):   Marland Kitchen Lack of Transportation (Non-Medical):   Physical Activity:   . Days of Exercise per Week:   . Minutes of Exercise per Session:   Stress:   . Feeling of Stress :   Social Connections:   . Frequency of Communication with Friends and Family:   . Frequency of Social Gatherings with Friends and Family:   . Attends Religious Services:   . Active Member of Clubs or Organizations:   . Attends Archivist Meetings:   .  Marital Status:   Intimate Partner Violence:   . Fear of Current or Ex-Partner:   . Emotionally Abused:   Marland Kitchen Physically Abused:   . Sexually Abused:    Current Meds  Medication Sig  . amLODipine (NORVASC) 2.5 MG tablet Take 1 tablet (2.5 mg total) by mouth daily at 6 PM.  . azelastine (ASTELIN) 0.1 % nasal spray Place 2 sprays into both nostrils as needed. Use in each nostril as directed   . calcium carbonate (OS-CAL) 600 MG TABS tablet Take 600 mg by mouth daily.   . cetirizine (ZYRTEC) 10 MG tablet Take 10 mg by mouth as needed.   . cholecalciferol (VITAMIN D) 1000 units tablet Take 1,000 Units by mouth daily.  . hydrochlorothiazide (HYDRODIURIL) 12.5 MG tablet Take 1 tablet (12.5 mg total) by mouth daily. In the am  . [DISCONTINUED] hydrochlorothiazide (HYDRODIURIL) 12.5 MG tablet Take 1  tablet (12.5 mg total) by mouth daily. In the am   Allergies  Allergen Reactions  . Lisinopril     Possible cause of itching/ elevated liver enzymes - discontinued 06/03/17.  Cough    Recent Results (from the past 2160 hour(s))  B12 and Folate Panel     Status: None   Collection Time: 02/15/19 11:22 AM  Result Value Ref Range   Vitamin B-12 478 232 - 1,245 pg/mL   Folate 13.0 >3.0 ng/mL    Comment: A serum folate concentration of less than 3.1 ng/mL is considered to represent clinical deficiency.   Methylmalonic acid, serum     Status: None   Collection Time: 02/15/19 11:22 AM  Result Value Ref Range   Methylmalonic Acid 145 0 - 378 nmol/L   Disclaimer: Comment     Comment: This test was developed and its performance characteristics determined by [facility]. It has not been cleared or approved by the Food and Drug Administration.    Objective  Body mass index is 27.51 kg/m. Wt Readings from Last 3 Encounters:  04/22/19 136 lb 3.2 oz (61.8 kg)  03/29/19 136 lb (61.7 kg)  02/15/19 134 lb (60.8 kg)   Temp Readings from Last 3 Encounters:  04/22/19 (!) 97.4 F (36.3 C) (Temporal)  02/15/19 (!) 96.8 F (36 C)  01/06/19 98.2 F (36.8 C)   BP Readings from Last 3 Encounters:  04/22/19 122/78  03/29/19 136/70  02/15/19 (!) 147/82   Pulse Readings from Last 3 Encounters:  04/22/19 67  02/15/19 72  01/06/19 84    Physical Exam Vitals and nursing note reviewed.  Constitutional:      Appearance: Normal appearance. She is well-developed and well-groomed.  HENT:     Head: Normocephalic and atraumatic.  Eyes:     Conjunctiva/sclera: Conjunctivae normal.     Pupils: Pupils are equal, round, and reactive to light.  Cardiovascular:     Rate and Rhythm: Normal rate and regular rhythm.     Heart sounds: Normal heart sounds. No murmur.  Pulmonary:     Effort: Pulmonary effort is normal.     Breath sounds: Normal breath sounds.  Abdominal:     General: Abdomen is flat.  Bowel sounds are normal.     Tenderness: There is no abdominal tenderness.  Skin:    General: Skin is warm and dry.  Neurological:     General: No focal deficit present.     Mental Status: She is alert and oriented to person, place, and time. Mental status is at baseline.     Gait: Gait normal.  Psychiatric:        Attention and Perception: Attention and perception normal.        Mood and Affect: Mood and affect normal.        Speech: Speech normal.        Behavior: Behavior normal. Behavior is cooperative.        Thought Content: Thought content normal.        Cognition and Memory: Cognition and memory normal.        Judgment: Judgment normal.     Assessment  Plan  Dysfunction of both eustachian tubes - Plan: fluticasone (FLONASE) 50 MCG/ACT nasal spray.zyrtec   Essential hypertension - Plan: hydrochlorothiazide (HYDRODIURIL) 12.5 MG tablet, Comprehensive metabolic panel, Lipid panel, CBC with Differential/Platelet, Hemoglobin A1c  Prediabetes - Plan: Hemoglobin A1c  Gallstones sx at times and GERD -call back when ready for surgery consult  Prn pepcid rolaids or tums  HM utd flu shot Tdap dueat f/u Disc shingrix todayagain pt declines covid 1/2   Hep C negative  Declines HIV  Pap 03/25/2018 neg pap neg HPV  mammo6/26/20 negorder in from ob/gyn 3/22/11colonoscopy -normal f/u in 10 years -had 02/11/19 ROI sent [facility]  DEXA 03/13/10 normal do another age 45   Provider: Dr. French Ana McLean-[facility]

## 2019-05-03 ENCOUNTER — Other Ambulatory Visit: Payer: Self-pay

## 2019-05-03 ENCOUNTER — Telehealth: Payer: Self-pay | Admitting: Internal Medicine

## 2019-05-03 ENCOUNTER — Ambulatory Visit: Payer: BC Managed Care – PPO | Attending: Internal Medicine

## 2019-05-03 DIAGNOSIS — Z23 Encounter for immunization: Secondary | ICD-10-CM

## 2019-05-03 NOTE — Progress Notes (Signed)
   Covid-19 Vaccination Clinic  Name:  Susan Allison    MRN: [id number] DOB: [birth year]  05/03/2019  Ms. [name] was observed post Covid-19 immunization for 15 minutes without incident. She was provided with Vaccine Information Sheet and instruction to access the V-Safe system.   [name] was instructed to call 911 with any severe reactions post vaccine: Marland Kitchen Difficulty breathing  . Swelling of face and throat  . A fast heartbeat  . A bad rash all over body  . Dizziness and weakness   Immunizations Administered    Name Date Dose VIS Date Route   Pfizer COVID-19 Vaccine 05/03/2019 10:03 AM 0.3 mL 01/07/2019 Intramuscular   Manufacturer: [employer], [employer]   Lot: [id number]   NDC: [address]-3

## 2019-05-03 NOTE — Telephone Encounter (Signed)
Faxed request for patient's last colonoscopy results to [facility]. Signed release form sent to scan.   Awaiting response.

## 2019-05-09 NOTE — Telephone Encounter (Signed)
Spoke with Aundra Millet and she states they will re-fax the results to [place] at [phone]

## 2019-05-16 ENCOUNTER — Encounter: Payer: Self-pay | Admitting: Internal Medicine

## 2019-05-30 ENCOUNTER — Encounter: Payer: Self-pay | Admitting: Internal Medicine

## 2019-05-30 NOTE — Telephone Encounter (Signed)
Yes thanks TMS

## 2019-05-30 NOTE — Telephone Encounter (Signed)
Did you receive these records?  

## 2019-06-30 DIAGNOSIS — H6983 Other specified disorders of Eustachian tube, bilateral: Secondary | ICD-10-CM | POA: Diagnosis not present

## 2019-06-30 DIAGNOSIS — H9203 Otalgia, bilateral: Secondary | ICD-10-CM | POA: Diagnosis not present

## 2019-07-27 ENCOUNTER — Ambulatory Visit
Admission: RE | Admit: 2019-07-27 | Discharge: 2019-07-27 | Disposition: A | Discharge status: Home / Self Care | Payer: BC Managed Care – PPO | Source: Ambulatory Visit | Attending: Obstetrics and Gynecology | Admitting: Obstetrics and Gynecology

## 2019-07-27 DIAGNOSIS — Z1231 Encounter for screening mammogram for malignant neoplasm of breast: Secondary | ICD-10-CM | POA: Insufficient documentation

## 2019-07-29 ENCOUNTER — Encounter: Payer: Self-pay | Admitting: Obstetrics and Gynecology

## 2019-09-27 ENCOUNTER — Encounter: Payer: Self-pay | Admitting: Internal Medicine

## 2019-10-11 ENCOUNTER — Encounter: Payer: Self-pay | Admitting: Medical

## 2019-10-11 ENCOUNTER — Other Ambulatory Visit: Payer: Self-pay

## 2019-10-11 ENCOUNTER — Ambulatory Visit: Payer: BC Managed Care – PPO | Admitting: Medical

## 2019-10-11 VITALS — BP 142/74 | HR 71 | Temp 97.3°F | Resp 16 | Wt 133.4 lb

## 2019-10-11 DIAGNOSIS — S39012A Strain of muscle, fascia and tendon of lower back, initial encounter: Secondary | ICD-10-CM

## 2019-10-11 MED ORDER — CYCLOBENZAPRINE HCL 5 MG PO TABS
5.0000 mg | ORAL_TABLET | Freq: Three times a day (TID) | ORAL | 1 refills | Status: DC | PRN
Start: 2019-10-11 — End: 2019-10-11

## 2019-10-11 MED ORDER — IBUPROFEN 800 MG PO TABS: 800.0000 mg | ORAL_TABLET | Freq: Three times a day (TID) | ORAL | 0 refills | Status: DC | PRN

## 2019-10-11 MED ORDER — CYCLOBENZAPRINE HCL 5 MG PO TABS: ORAL_TABLET | ORAL | 0 refills | Status: DC

## 2019-10-11 NOTE — Progress Notes (Signed)
   Subjective:    Patient ID: Susan Allison, female    DOB: [birth year], 59 y.o.   MRN: [id number]  HPI  59 yo lfemale in non acute distress. Has had lower back pain  Last episode this week L>R. Takes miralax for constipation.  Review of Systems Tingling  In left leg, tested for restless leg., pending MRI of brain , patient has not gone MRI . Due to cost.     Blood pressure (!) 142/74, pulse 71, temperature (!) 97.3 F (36.3 C), temperature source Temporal, resp. rate 16, weight 133 lb 6.4 oz (60.5 kg), SpO2 100 %. Allergies  Allergen Reactions  . Lisinopril     Possible cause of itching/ elevated liver enzymes - discontinued 06/03/17.  Cough      Objective:   Physical Exam Constitutional:      Appearance: Normal appearance.  HENT:     Head: Normocephalic and atraumatic.  Eyes:     Extraocular Movements: Extraocular movements intact.     Conjunctiva/sclera: Conjunctivae normal.     Pupils: Pupils are equal, round, and reactive to light.  Cardiovascular:     Rate and Rhythm: Normal rate and regular rhythm.     Pulses: Normal pulses.     Heart sounds: Normal heart sounds.  Pulmonary:     Effort: Pulmonary effort is normal.     Breath sounds: Normal breath sounds.  Abdominal:     General: Bowel sounds are normal. There is no distension.     Palpations: Abdomen is soft.     Tenderness: There is no abdominal tenderness. There is no right CVA tenderness, left CVA tenderness or guarding.  Musculoskeletal:        General: Normal range of motion.     Cervical back: Normal range of motion and neck supple.     Comments: Muscle spasm left lower back paravertebral  Skin:    General: Skin is warm and dry.  Neurological:     Mental Status: She is alert.     Easily gets off of exam table.  -SLR bilateral, gait within normal limits    Assessment & Plan:  Back strain left side No lifting till pain is resolved. Alternate heat and ice. Code for MRI Brain.For her doctor to get  specialty co pay fee at [facility]. Meds ordered this encounter  Medications  . DISCONTD: cyclobenzaprine (FLEXERIL) 5 MG tablet    Sig: Take 1 tablet (5 mg total) by mouth 3 (three) times daily as needed for muscle spasms.    Dispense:  30 tablet    Refill:  1  . ibuprofen (ADVIL) 800 MG tablet    Sig: Take 1 tablet (800 mg total) by mouth every 8 (eight) hours as needed.    Dispense:  30 tablet    Refill:  0  . cyclobenzaprine (FLEXERIL) 5 MG tablet    Sig: 1/2 to 1 tablet at bedtime, will cause sedation.    Dispense:  15 tablet    Refill:  0  Call if not improving in 3-5 days. Patient verbalizes understanding and has no questions at discharge. Return to clinic if no t improving in 3-5 days.

## 2019-10-11 NOTE — Patient Instructions (Signed)
Back Exercises These exercises help to make your trunk and back strong. They also help to keep the lower back flexible. Doing these exercises can help to prevent back pain or lessen existing pain.  If you have back pain, try to do these exercises 2-3 times each day or as told by your doctor.  As you get better, do the exercises once each day. Repeat the exercises more often as told by your doctor.  To stop back pain from coming back, do the exercises once each day, or as told by your doctor. Exercises Single knee to chest Do these steps 3-5 times in a row for each leg: 1. Lie on your back on a firm bed or the floor with your legs stretched out. 2. Bring one knee to your chest. 3. Grab your knee or thigh with both hands and hold them it in place. 4. Pull on your knee until you feel a gentle stretch in your lower back or buttocks. 5. Keep doing the stretch for 10-30 seconds. 6. Slowly let go of your leg and straighten it. Pelvic tilt Do these steps 5-10 times in a row: 1. Lie on your back on a firm bed or the floor with your legs stretched out. 2. Bend your knees so they point up to the ceiling. Your feet should be flat on the floor. 3. Tighten your lower belly (abdomen) muscles to press your lower back against the floor. This will make your tailbone point up to the ceiling instead of pointing down to your feet or the floor. 4. Stay in this position for 5-10 seconds while you gently tighten your muscles and breathe evenly. Cat-cow Do these steps until your lower back bends more easily: 1. Get on your hands and knees on a firm surface. Keep your hands under your shoulders, and keep your knees under your hips. You may put padding under your knees. 2. Let your head hang down toward your chest. Tighten (contract) the muscles in your belly. Point your tailbone toward the floor so your lower back becomes rounded like the back of a cat. 3. Stay in this position for 5 seconds. 4. Slowly lift your  head. Let the muscles of your belly relax. Point your tailbone up toward the ceiling so your back forms a sagging arch like the back of a cow. 5. Stay in this position for 5 seconds.  Press-ups Do these steps 5-10 times in a row: 1. Lie on your belly (face-down) on the floor. 2. Place your hands near your head, about shoulder-width apart. 3. While you keep your back relaxed and keep your hips on the floor, slowly straighten your arms to raise the top half of your body and lift your shoulders. Do not use your back muscles. You may change where you place your hands in order to make yourself more comfortable. 4. Stay in this position for 5 seconds. 5. Slowly return to lying flat on the floor.  Bridges Do these steps 10 times in a row: 1. Lie on your back on a firm surface. 2. Bend your knees so they point up to the ceiling. Your feet should be flat on the floor. Your arms should be flat at your sides, next to your body. 3. Tighten your butt muscles and lift your butt off the floor until your waist is almost as high as your knees. If you do not feel the muscles working in your butt and the back of your thighs, slide your feet 1-2 inches   farther away from your butt. 4. Stay in this position for 3-5 seconds. 5. Slowly lower your butt to the floor, and let your butt muscles relax. If this exercise is too easy, try doing it with your arms crossed over your chest. Belly crunches Do these steps 5-10 times in a row: 1. Lie on your back on a firm bed or the floor with your legs stretched out. 2. Bend your knees so they point up to the ceiling. Your feet should be flat on the floor. 3. Cross your arms over your chest. 4. Tip your chin a little bit toward your chest but do not bend your neck. 5. Tighten your belly muscles and slowly raise your chest just enough to lift your shoulder blades a tiny bit off of the floor. Avoid raising your body higher than that, because it can put too much stress on your low  back. 6. Slowly lower your chest and your head to the floor. Back lifts Do these steps 5-10 times in a row: 1. Lie on your belly (face-down) with your arms at your sides, and rest your forehead on the floor. 2. Tighten the muscles in your legs and your butt. 3. Slowly lift your chest off of the floor while you keep your hips on the floor. Keep the back of your head in line with the curve in your back. Look at the floor while you do this. 4. Stay in this position for 3-5 seconds. 5. Slowly lower your chest and your face to the floor. Contact a doctor if:  Your back pain gets a lot worse when you do an exercise.  Your back pain does not get better 2 hours after you exercise. If you have any of these problems, stop doing the exercises. Do not do them again unless your doctor says it is okay. Get help right away if:  You have sudden, very bad back pain. If this happens, stop doing the exercises. Do not do them again unless your doctor says it is okay. This information is not intended to replace advice given to you by your health care provider. Make sure you discuss any questions you have with your health care provider. Document Revised: 10/08/2017 Document Reviewed: 10/08/2017 Elsevier Patient Education  2020 [employer]. Acute Back Pain, Adult Acute back pain is sudden and usually short-lived. It is often caused by an injury to the muscles and tissues in the back. The injury may result from:  A muscle or ligament getting overstretched or torn (strained). Ligaments are tissues that connect bones to each other. Lifting something improperly can cause a back strain.  Wear and tear (degeneration) of the spinal disks. Spinal disks are circular tissue that provides cushioning between the bones of the spine (vertebrae).  Twisting motions, such as while playing sports or doing yard work.  A hit to the back.  Arthritis. You may have a physical exam, lab tests, and imaging tests to find the  cause of your pain. Acute back pain usually goes away with rest and home care. Follow these instructions at home: Managing pain, stiffness, and swelling  Take over-the-counter and prescription medicines only as told by your health care provider.  Your health care provider may recommend applying ice during the first 24-48 hours after your pain starts. To do this: ? Put ice in a plastic bag. ? Place a towel between your skin and the bag. ? Leave the ice on for 20 minutes, 2-3 times a day.  If directed,   apply heat to the affected area as often as told by your health care provider. Use the heat source that your health care provider recommends, such as a moist heat pack or a heating pad. ? Place a towel between your skin and the heat source. ? Leave the heat on for 20-30 minutes. ? Remove the heat if your skin turns bright red. This is especially important if you are unable to feel pain, heat, or cold. You have a greater risk of getting burned. Activity   Do not stay in bed. Staying in bed for more than 1-2 days can delay your recovery.  Sit up and stand up straight. Avoid leaning forward when you sit, or hunching over when you stand. ? If you work at a desk, sit close to it so you do not need to lean over. Keep your chin tucked in. Keep your neck drawn back, and keep your elbows bent at a right angle. Your arms should look like the letter "L." ? Sit high and close to the steering wheel when you drive. Add lower back (lumbar) support to your car seat, if needed.  Take short walks on even surfaces as soon as you are able. Try to increase the length of time you walk each day.  Do not sit, drive, or stand in one place for more than 30 minutes at a time. Sitting or standing for long periods of time can put stress on your back.  Do not drive or use heavy machinery while taking prescription pain medicine.  Use proper lifting techniques. When you bend and lift, use positions that put less stress on  your back: ? Bend your knees. ? Keep the load close to your body. ? Avoid twisting.  Exercise regularly as told by your health care provider. Exercising helps your back heal faster and helps prevent back injuries by keeping muscles strong and flexible.  Work with a physical therapist to make a safe exercise program, as recommended by your health care provider. Do any exercises as told by your physical therapist. Lifestyle  Maintain a healthy weight. Extra weight puts stress on your back and makes it difficult to have good posture.  Avoid activities or situations that make you feel anxious or stressed. Stress and anxiety increase muscle tension and can make back pain worse. Learn ways to manage anxiety and stress, such as through exercise. General instructions  Sleep on a firm mattress in a comfortable position. Try lying on your side with your knees slightly bent. If you lie on your back, put a pillow under your knees.  Follow your treatment plan as told by your health care provider. This may include: ? Cognitive or behavioral therapy. ? Acupuncture or massage therapy. ? Meditation or yoga. Contact a health care provider if:  You have pain that is not relieved with rest or medicine.  You have increasing pain going down into your legs or buttocks.  Your pain does not improve after 2 weeks.  You have pain at night.  You lose weight without trying.  You have a fever or chills. Get help right away if:  You develop new bowel or bladder control problems.  You have unusual weakness or numbness in your arms or legs.  You develop nausea or vomiting.  You develop abdominal pain.  You feel faint. Summary  Acute back pain is sudden and usually short-lived.  Use proper lifting techniques. When you bend and lift, use positions that put less stress on your back.    Take over-the-counter and prescription medicines and apply heat or ice as directed by your health care provider. This  information is not intended to replace advice given to you by your health care provider. Make sure you discuss any questions you have with your health care provider. Document Revised: 05/04/2018 Document Reviewed: 08/27/2016 Elsevier Patient Education  2020 Elsevier Inc.  

## 2019-10-13 ENCOUNTER — Other Ambulatory Visit (INDEPENDENT_AMBULATORY_CARE_PROVIDER_SITE_OTHER): Payer: BC Managed Care – PPO

## 2019-10-13 ENCOUNTER — Other Ambulatory Visit: Payer: Self-pay

## 2019-10-13 DIAGNOSIS — R7303 Prediabetes: Secondary | ICD-10-CM | POA: Diagnosis not present

## 2019-10-13 DIAGNOSIS — I10 Essential (primary) hypertension: Secondary | ICD-10-CM | POA: Diagnosis not present

## 2019-10-13 DIAGNOSIS — Z1389 Encounter for screening for other disorder: Secondary | ICD-10-CM

## 2019-10-13 DIAGNOSIS — Z1329 Encounter for screening for other suspected endocrine disorder: Secondary | ICD-10-CM

## 2019-10-13 LAB — CBC WITH DIFFERENTIAL/PLATELET
Basophils Absolute: 0 10*3/uL (ref 0.0–0.1)
Basophils Relative: 0.9 % (ref 0.0–3.0)
Eosinophils Absolute: 0.1 10*3/uL (ref 0.0–0.7)
Eosinophils Relative: 1.8 % (ref 0.0–5.0)
HCT: 39.6 % (ref 36.0–46.0)
Hemoglobin: 12.9 g/dL (ref 12.0–15.0)
Lymphocytes Relative: 41.5 % (ref 12.0–46.0)
Lymphs Abs: 1.7 10*3/uL (ref 0.7–4.0)
MCHC: 32.6 g/dL (ref 30.0–36.0)
MCV: 86.4 fl (ref 78.0–100.0)
Monocytes Absolute: 0.3 10*3/uL (ref 0.1–1.0)
Monocytes Relative: 8.4 % (ref 3.0–12.0)
Neutro Abs: 1.9 10*3/uL (ref 1.4–7.7)
Neutrophils Relative %: 47.4 % (ref 43.0–77.0)
Platelets: 249 10*3/uL (ref 150.0–400.0)
RBC: 4.58 Mil/uL (ref 3.87–5.11)
RDW: 14.5 % (ref 11.5–15.5)
WBC: 4 10*3/uL (ref 4.0–10.5)

## 2019-10-13 LAB — LIPID PANEL
Cholesterol: 177 mg/dL (ref 0–200)
HDL: 51.9 mg/dL (ref >39.00)
LDL Cholesterol: 108 mg/dL — ABNORMAL HIGH (ref 0–99)
NonHDL: 125.12
Total CHOL/HDL Ratio: 3
Triglycerides: 84 mg/dL (ref 0.0–149.0)
VLDL: 16.8 mg/dL (ref 0.0–40.0)

## 2019-10-13 LAB — COMPREHENSIVE METABOLIC PANEL
ALT: 10 U/L (ref 0–35)
AST: 17 U/L (ref 0–37)
Albumin: 4.7 g/dL (ref 3.5–5.2)
Alkaline Phosphatase: 94 U/L (ref 39–117)
BUN: 15 mg/dL (ref 6–23)
CO2: 31 mEq/L (ref 19–32)
Calcium: 9.7 mg/dL (ref 8.4–10.5)
Chloride: 102 mEq/L (ref 96–112)
Creatinine, Ser: 1.09 mg/dL (ref 0.40–1.20)
GFR: 61.78 mL/min (ref >60.00)
Glucose, Bld: 95 mg/dL (ref 70–99)
Potassium: 3.7 mEq/L (ref 3.5–5.1)
Sodium: 140 mEq/L (ref 135–145)
Total Bilirubin: 0.4 mg/dL (ref 0.2–1.2)
Total Protein: 7.5 g/dL (ref 6.0–8.3)

## 2019-10-13 LAB — TSH: TSH: 6.45 u[IU]/mL — ABNORMAL HIGH (ref 0.35–4.50)

## 2019-10-13 LAB — HEMOGLOBIN A1C: Hgb A1c MFr Bld: 6.1 % (ref 4.6–6.5)

## 2019-10-14 LAB — URINALYSIS, ROUTINE W REFLEX MICROSCOPIC
Bilirubin Urine: NEGATIVE
Glucose, UA: NEGATIVE
Hgb urine dipstick: NEGATIVE
Ketones, ur: NEGATIVE
Leukocytes,Ua: NEGATIVE
Nitrite: NEGATIVE
Protein, ur: NEGATIVE
Specific Gravity, Urine: 1.017 (ref 1.001–1.03)
pH: 5.5 (ref 5.0–8.0)

## 2019-10-17 ENCOUNTER — Telehealth: Payer: Self-pay | Admitting: Internal Medicine

## 2019-10-17 ENCOUNTER — Other Ambulatory Visit: Payer: Self-pay

## 2019-10-17 DIAGNOSIS — R7989 Other specified abnormal findings of blood chemistry: Secondary | ICD-10-CM

## 2019-10-17 NOTE — Telephone Encounter (Signed)
Patient called in for results from labs 

## 2019-10-17 NOTE — Telephone Encounter (Signed)
Labs from 10/13/19 resulted. Please advise

## 2019-11-24 ENCOUNTER — Other Ambulatory Visit: Payer: Self-pay

## 2019-11-24 ENCOUNTER — Other Ambulatory Visit (INDEPENDENT_AMBULATORY_CARE_PROVIDER_SITE_OTHER): Payer: BC Managed Care – PPO

## 2019-11-24 DIAGNOSIS — R7989 Other specified abnormal findings of blood chemistry: Secondary | ICD-10-CM | POA: Diagnosis not present

## 2019-11-25 LAB — THYROID PEROXIDASE ANTIBODIES (TPO) (REFL): Thyroperoxidase Ab SerPl-aCnc: 832 IU/mL — ABNORMAL HIGH (ref <9)

## 2019-11-26 LAB — TSH: TSH: 5.63 mIU/L — ABNORMAL HIGH (ref 0.40–4.50)

## 2019-11-26 LAB — T3, FREE: T3, Free: 3.1 pg/mL (ref 2.3–4.2)

## 2019-11-26 LAB — T4, FREE: Free T4: 1 ng/dL (ref 0.8–1.8)

## 2019-11-28 ENCOUNTER — Telehealth: Payer: Self-pay | Admitting: Internal Medicine

## 2019-11-28 NOTE — Telephone Encounter (Signed)
Patient called in for her results

## 2019-11-29 NOTE — Telephone Encounter (Signed)
-----   Message from Bevelyn Buckles, MD sent at 11/27/2019  7:01 PM EDT ----- Thyroid lab tsh still elevated and thyroid antibodies  Likely autoimmune thyroid issues I.e hashimotos  Is she agreeable to levothyroxine 25 mcg daily in the am 30 minutes before breakfast?

## 2019-11-30 NOTE — Telephone Encounter (Signed)
Patient called about result of lab.

## 2019-12-01 NOTE — Telephone Encounter (Signed)
Pt called returning a call  °

## 2019-12-01 NOTE — Telephone Encounter (Signed)
Patient informed and verbalized understanding.  She is agreeable to medication

## 2019-12-05 ENCOUNTER — Other Ambulatory Visit: Payer: Self-pay | Admitting: Internal Medicine

## 2019-12-05 DIAGNOSIS — E039 Hypothyroidism, unspecified: Secondary | ICD-10-CM | POA: Insufficient documentation

## 2019-12-05 MED ORDER — LEVOTHYROXINE SODIUM 25 MCG PO TABS: 25.0000 ug | ORAL_TABLET | Freq: Every day | ORAL | 3 refills | Status: DC

## 2019-12-05 NOTE — Telephone Encounter (Signed)
Thyroid medication sent to pharmacy  Please call pt and sch appt in person in 6-8 weeks   Thanks TMS

## 2020-01-03 ENCOUNTER — Encounter: Payer: Self-pay | Admitting: Internal Medicine

## 2020-01-19 ENCOUNTER — Ambulatory Visit (INDEPENDENT_AMBULATORY_CARE_PROVIDER_SITE_OTHER): Payer: BC Managed Care – PPO | Admitting: Internal Medicine

## 2020-01-19 ENCOUNTER — Other Ambulatory Visit: Payer: Self-pay

## 2020-01-19 ENCOUNTER — Encounter: Payer: Self-pay | Admitting: Internal Medicine

## 2020-01-19 VITALS — BP 122/78 | HR 75 | Temp 98.7°F | Ht 59.0 in | Wt 132.2 lb

## 2020-01-19 DIAGNOSIS — R7989 Other specified abnormal findings of blood chemistry: Secondary | ICD-10-CM

## 2020-01-19 DIAGNOSIS — Z8719 Personal history of other diseases of the digestive system: Secondary | ICD-10-CM

## 2020-01-19 DIAGNOSIS — M545 Low back pain, unspecified: Secondary | ICD-10-CM

## 2020-01-19 DIAGNOSIS — Z23 Encounter for immunization: Secondary | ICD-10-CM

## 2020-01-19 DIAGNOSIS — G5603 Carpal tunnel syndrome, bilateral upper limbs: Secondary | ICD-10-CM | POA: Diagnosis not present

## 2020-01-19 DIAGNOSIS — G959 Disease of spinal cord, unspecified: Secondary | ICD-10-CM

## 2020-01-19 DIAGNOSIS — G5600 Carpal tunnel syndrome, unspecified upper limb: Secondary | ICD-10-CM | POA: Insufficient documentation

## 2020-01-19 DIAGNOSIS — I10 Essential (primary) hypertension: Secondary | ICD-10-CM

## 2020-01-19 DIAGNOSIS — R7303 Prediabetes: Secondary | ICD-10-CM

## 2020-01-19 DIAGNOSIS — E039 Hypothyroidism, unspecified: Secondary | ICD-10-CM

## 2020-01-19 DIAGNOSIS — Z0000 Encounter for general adult medical examination without abnormal findings: Secondary | ICD-10-CM

## 2020-01-19 DIAGNOSIS — R946 Abnormal results of thyroid function studies: Secondary | ICD-10-CM

## 2020-01-19 DIAGNOSIS — F5104 Psychophysiologic insomnia: Secondary | ICD-10-CM

## 2020-01-19 DIAGNOSIS — R748 Abnormal levels of other serum enzymes: Secondary | ICD-10-CM

## 2020-01-19 DIAGNOSIS — G8929 Other chronic pain: Secondary | ICD-10-CM

## 2020-01-19 DIAGNOSIS — H6983 Other specified disorders of Eustachian tube, bilateral: Secondary | ICD-10-CM

## 2020-01-19 DIAGNOSIS — R937 Abnormal findings on diagnostic imaging of other parts of musculoskeletal system: Secondary | ICD-10-CM

## 2020-01-19 DIAGNOSIS — M542 Cervicalgia: Secondary | ICD-10-CM

## 2020-01-19 DIAGNOSIS — H6993 Unspecified Eustachian tube disorder, bilateral: Secondary | ICD-10-CM | POA: Insufficient documentation

## 2020-01-19 NOTE — Addendum Note (Signed)
Addended by: Quentin Ore on: 01/19/2020 12:38 PM   Modules accepted: Orders

## 2020-01-19 NOTE — Patient Instructions (Addendum)
Melatonin 5 mg-10 mg at night 1 hour before bed  Or  L theanine 100-200 mg at night  NATURE MADE    [facility] [employer] or [place] [facility] for wrist braces   Let me know when ready for prednisone if needed    Please let me know where to go for MRI neck and low back  Consider pfizer booster in 2 weeks    Carpal Tunnel Syndrome  Carpal tunnel syndrome is a condition that causes pain in your hand and arm. The carpal tunnel is a narrow area located on the palm side of your wrist. Repeated wrist motion or certain diseases may cause swelling within the tunnel. This swelling pinches the main nerve in the wrist (median nerve). What are the causes? This condition may be caused by:  Repeated wrist motions.  Wrist injuries.  Arthritis.  A cyst or tumor in the carpal tunnel.  Fluid buildup during pregnancy. Sometimes the cause of this condition is not known. What increases the risk? The following factors may make you more likely to develop this condition:  Having a job, such as being a Haematologist, that requires you to repeatedly move your wrist in the same motion.  Being a woman.  Having certain conditions, such as: ? Diabetes. ? Obesity. ? An underactive thyroid (hypothyroidism). ? Kidney failure. What are the signs or symptoms? Symptoms of this condition include:  A tingling feeling in your fingers, especially in your thumb, index, and middle fingers.  Tingling or numbness in your hand.  An aching feeling in your entire arm, especially when your wrist and elbow are bent for a long time.  Wrist pain that goes up your arm to your shoulder.  Pain that goes down into your palm or fingers.  A weak feeling in your hands. You may have trouble grabbing and holding items. Your symptoms may feel worse during the night. How is this diagnosed? This condition is diagnosed with a medical history and physical exam. You may also have tests, including:  Electromyogram  (EMG). This test measures electrical signals sent by your nerves into the muscles.  Nerve conduction study. This test measures how well electrical signals pass through your nerves.  Imaging tests, such as X-rays, ultrasound, and MRI. These tests check for possible causes of your condition. How is this treated? This condition may be treated with:  Lifestyle changes. It is important to stop or change the activity that caused your condition.  Doing exercise and activities to strengthen your muscles and bones (physical therapy).  Learning how to use your hand again after diagnosis (occupational therapy).  Medicines for pain and inflammation. This may include medicine that is injected into your wrist.  A wrist splint.  Surgery. Follow these instructions at home: If you have a splint:  Wear the splint as told by your health care provider. Remove it only as told by your health care provider.  Loosen the splint if your fingers tingle, become numb, or turn cold and blue.  Keep the splint clean.  If the splint is not waterproof: ? Do not let it get wet. ? Cover it with a watertight covering when you take a bath or shower. Managing pain, stiffness, and swelling   If directed, put ice on the painful area: ? If you have a removable splint, remove it as told by your health care provider. ? Put ice in a plastic bag. ? Place a towel between your skin and the bag. ? Leave  the ice on for 20 minutes, 2-3 times per day. General instructions  Take over-the-counter and prescription medicines only as told by your health care provider.  Rest your wrist from any activity that may be causing your pain. If your condition is work related, talk with your employer about changes that can be made, such as getting a wrist pad to use while typing.  Do any exercises as told by your health care provider, physical therapist, or occupational therapist.  Keep all follow-up visits as told by your health care  provider. This is important. Contact a health care provider if:  You have new symptoms.  Your pain is not controlled with medicines.  Your symptoms get worse. Get help right away if:  You have severe numbness or tingling in your wrist or hand. Summary  Carpal tunnel syndrome is a condition that causes pain in your hand and arm.  It is usually caused by repeated wrist motions.  Lifestyle changes and medicines are used to treat carpal tunnel syndrome. Surgery may be recommended.  Follow your health care provider's instructions about wearing a splint, resting from activity, keeping follow-up visits, and calling for help. This information is not intended to replace advice given to you by your health care provider. Make sure you discuss any questions you have with your health care provider. Document Revised: 05/22/2017 Document Reviewed: 05/22/2017 Elsevier Patient Education  2020 [employer].

## 2020-01-19 NOTE — Progress Notes (Addendum)
Chief Complaint  Patient presents with  . Follow-up   Annual  1. HTN controlled on norvasc 2.5 mg qd and on hctz 12.5 mg qd  2. C/o b/l ETD est with ENT who disc steroids in the past tubes or surgery  3. Due Tdap  4. Chronic neck pain and leg pain and wrist hand pain hand pain L>R and left handed w/u with neurology +CTS L>R and seen neurology for list  5. C/o insomnia  Review of Systems  Constitutional: Negative for weight loss.  HENT: Negative for hearing loss.   Eyes: Negative for blurred vision.  Respiratory: Negative for shortness of breath.   Cardiovascular: Negative for chest pain.  Gastrointestinal: Negative for abdominal pain.  Musculoskeletal: Negative for falls and joint pain.  Skin: Negative for rash.  Neurological: Positive for sensory change.  Psychiatric/Behavioral: Negative for depression and memory loss.   Past Medical History:  Diagnosis Date  . Arthritis   . GERD (gastroesophageal reflux disease)   . Hypertension   . Joint pain   . Mastodynia   . Sinus problem    Past Surgical History:  Procedure Laterality Date  . COLONOSCOPY  2011  . ESOPHAGOGASTRODUODENOSCOPY N/A 08/14/2014   Procedure: ESOPHAGOGASTRODUODENOSCOPY (EGD);  Surgeon: [name], MD;  Location: [facility] ENDOSCOPY;  Service: Gastroenterology;  Laterality: N/A;   Family History  Problem Relation Age of Onset  . Breast cancer Other        40s, has contact  . Hypertension Mother   . Liver disease Mother   . Other Mother        brain tumor   . Prostate cancer Father 80  . Diabetes Brother   . Arthritis/Rheumatoid Sister   . Lupus Maternal Aunt   . Heart attack Maternal Grandmother    Social History   Socioeconomic History  . Marital status: Married    Spouse name: Not on file  . Number of children: 2  . Years of education: Not on file  . Highest education level: Bachelor's degree (e.g., BA, AB, BS)  Occupational History  . Not on file  Tobacco Use  . Smoking status: Never Smoker  .  Smokeless tobacco: Never Used  Vaping Use  . Vaping Use: Never used  Substance and Sexual Activity  . Alcohol use: Never  . Drug use: Never  . Sexual activity: Yes    Birth control/protection: Post-menopausal  Other Topics Concern  . Not on file  Social History Narrative   Married.   2 children. 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    Lives at home with spouse & children   Left handed   Caffeine: no   Social Determinants of Health   Financial Resource Strain: Not on file  Food Insecurity: Not on file  Transportation Needs: Not on file  Physical Activity: Not on file  Stress: Not on file  Social Connections: Not on file  Intimate Partner Violence: Not on file   Current Meds  Medication Sig  . amLODipine (NORVASC) 2.5 MG tablet Take 1 tablet (2.5 mg total) by mouth daily at 6 PM.  . azelastine (ASTELIN) 0.1 % nasal spray Place 2 sprays into both nostrils as needed. Use in each nostril as directed  . calcium carbonate (OS-CAL) 600 MG TABS tablet Take 600 mg by mouth daily.   . cetirizine (ZYRTEC) 10 MG tablet Take 10 mg by mouth as  needed.   . cholecalciferol (VITAMIN D) 1000 units tablet Take 1,000 Units by mouth daily.  . fluticasone (FLONASE) 50 MCG/ACT nasal spray Place 1-2 sprays into both nostrils daily.  . hydrochlorothiazide (HYDRODIURIL) 12.5 MG tablet Take 1 tablet (12.5 mg total) by mouth daily. In the am  . levothyroxine (SYNTHROID) 25 MCG tablet Take 1 tablet (25 mcg total) by mouth daily before breakfast. 30 min before food in the am   Allergies  Allergen Reactions  . Lisinopril     Possible cause of itching/ elevated liver enzymes - discontinued 06/03/17.  Cough    Recent Results (from the past 2160 hour(s))  Thyroid Peroxidase Antibodies (TPO) (REFL)     Status: Abnormal   Collection Time: 11/24/19  9:08 AM  Result Value Ref Range   Thyroperoxidase Ab SerPl-aCnc 832  (H) <9 IU/mL  T4, free     Status: None   Collection Time: 11/24/19  9:09 AM  Result Value Ref Range   Free T4 1.0 0.8 - 1.8 ng/dL  T3, free     Status: None   Collection Time: 11/24/19  9:09 AM  Result Value Ref Range   T3, Free 3.1 2.3 - 4.2 pg/mL  TSH     Status: Abnormal   Collection Time: 11/24/19  9:09 AM  Result Value Ref Range   TSH 5.63 (H) 0.40 - 4.50 mIU/L   Objective  Body mass index is 26.7 kg/m. Wt Readings from Last 3 Encounters:  01/19/20 132 lb 3.2 oz (60 kg)  10/11/19 133 lb 6.4 oz (60.5 kg)  04/22/19 136 lb 3.2 oz (61.8 kg)   Temp Readings from Last 3 Encounters:  01/19/20 98.7 F (37.1 C) (Oral)  10/11/19 (!) 97.3 F (36.3 C) (Temporal)  04/22/19 (!) 97.4 F (36.3 C) (Temporal)   BP Readings from Last 3 Encounters:  01/19/20 122/78  10/11/19 (!) 142/74  04/22/19 122/78   Pulse Readings from Last 3 Encounters:  01/19/20 75  10/11/19 71  04/22/19 67    Physical Exam Vitals and nursing note reviewed.  Constitutional:      Appearance: Normal appearance. She is well-developed, well-groomed and overweight.  HENT:     Head: Normocephalic and atraumatic.  Cardiovascular:     Rate and Rhythm: Normal rate and regular rhythm.     Heart sounds: Normal heart sounds. No murmur heard.   Pulmonary:     Effort: Pulmonary effort is normal.     Breath sounds: Normal breath sounds.  Skin:    General: Skin is warm and dry.  Neurological:     General: No focal deficit present.     Mental Status: She is alert and oriented to person, place, and time. Mental status is at baseline.     Gait: Gait normal.  Psychiatric:        Attention and Perception: Attention and perception normal.        Mood and Affect: Mood and affect normal.        Speech: Speech normal.        Behavior: Behavior normal. Behavior is cooperative.        Thought Content: Thought content normal.        Cognition and Memory: Cognition and memory normal.        Judgment: Judgment normal.      Assessment  Plan  Annual physical exam utd flu shot Tdap given today Disc shingrix todayagain pt declines covid 2/2 pfizer disc booster   Hep C negative  Declines  HIV   Pap 03/25/2018 neg pap neg HPV  mammo6/26/20 negorder in from ob/gyn, 07/27/19 neg 3/22/11colonoscopy -normal f/u in 10 years -had 02/11/19 diver/ih repeat 10 years no FH   DEXA 03/13/10 normal do another age 25  rec healthy diet and exercise   Bilateral carpal tunnel syndrome Rx wrist braces today   Dysfunction of both eustachian tubes F/u ENT prn  Consider steroids orally   Chronic cervical pain Chronic low back pain, unspecified back pain laterality, unspecified whether sciatica present MRI C and L spine will let know when ready to order  MRI 02/22/20 cervical  IMPRESSION: 1. Subtle left foraminal disc protrusion at C7-T1, potentially affecting the transiting left C8 nerve root. 2. Right eccentric disc osteophyte complex at C5-6 with resultant mild canal and right C6 foraminal stenosis. 3. Small central disc protrusion at C4-5 with resultant mild spinal stenosis and minimal flattening of the ventral cord. 4. Mild left C7 foraminal stenosis related to disc bulge and uncovertebral disease.   Electronically Signed   By: Rise Mu M.D.   On: 02/22/2020 19:43   MRI lumbar  IMPRESSION: 1. Disc bulge with facet hypertrophy at L4-5 with resultant mild canal and bilateral lateral recess stenosis, with mild to moderate bilateral L4 foraminal narrowing. 2. Subtle right extraforaminal disc protrusion at L2-3, closely approximating and potentially irritating the exiting right L2 nerve root.   Electronically Signed   By: Rise Mu M.D.   On: 02/22/2020 19:50   Chronic insomnia  otc  Melatonin 5 mg-10 mg at night 1 hour before bed  Or  L theanine 100-200 mg at night  NATURE MADE   Provider: Dr. French Ana McLean-Scocuzza-[facility]

## 2020-02-01 ENCOUNTER — Encounter: Payer: Self-pay | Admitting: Internal Medicine

## 2020-02-01 NOTE — Addendum Note (Signed)
Addended by: Quentin Ore on: 02/01/2020 12:39 PM   Modules accepted: Orders

## 2020-02-22 ENCOUNTER — Ambulatory Visit
Admission: RE | Admit: 2020-02-22 | Discharge: 2020-02-22 | Disposition: A | Discharge status: Home / Self Care | Payer: BC Managed Care – PPO | Source: Ambulatory Visit | Attending: Internal Medicine | Admitting: Internal Medicine

## 2020-02-22 ENCOUNTER — Other Ambulatory Visit: Payer: Self-pay

## 2020-02-22 DIAGNOSIS — M48061 Spinal stenosis, lumbar region without neurogenic claudication: Secondary | ICD-10-CM | POA: Diagnosis not present

## 2020-02-22 DIAGNOSIS — G959 Disease of spinal cord, unspecified: Secondary | ICD-10-CM

## 2020-02-22 DIAGNOSIS — G8929 Other chronic pain: Secondary | ICD-10-CM

## 2020-02-22 DIAGNOSIS — M545 Low back pain, unspecified: Secondary | ICD-10-CM

## 2020-02-22 DIAGNOSIS — M4802 Spinal stenosis, cervical region: Secondary | ICD-10-CM | POA: Diagnosis not present

## 2020-02-24 DIAGNOSIS — Z23 Encounter for immunization: Secondary | ICD-10-CM | POA: Diagnosis not present

## 2020-02-27 DIAGNOSIS — R937 Abnormal findings on diagnostic imaging of other parts of musculoskeletal system: Secondary | ICD-10-CM | POA: Insufficient documentation

## 2020-02-27 NOTE — Addendum Note (Signed)
Addended by: Quentin Ore on: 02/27/2020 02:49 PM   Modules accepted: Orders

## 2020-03-12 ENCOUNTER — Other Ambulatory Visit: Payer: Self-pay

## 2020-03-12 ENCOUNTER — Encounter: Payer: Self-pay | Admitting: Medical

## 2020-03-12 ENCOUNTER — Ambulatory Visit: Payer: BC Managed Care – PPO | Admitting: Medical

## 2020-03-12 VITALS — BP 150/76 | HR 77 | Temp 98.0°F | Resp 16 | Wt 133.6 lb

## 2020-03-12 DIAGNOSIS — Z889 Allergy status to unspecified drugs, medicaments and biological substances status: Secondary | ICD-10-CM

## 2020-03-12 DIAGNOSIS — H6502 Acute serous otitis media, left ear: Secondary | ICD-10-CM

## 2020-03-12 DIAGNOSIS — H6983 Other specified disorders of Eustachian tube, bilateral: Secondary | ICD-10-CM

## 2020-03-12 MED ORDER — AMOXICILLIN-POT CLAVULANATE 875-125 MG PO TABS: 1.0000 | ORAL_TABLET | Freq: Two times a day (BID) | ORAL | 0 refills | Status: DC

## 2020-03-12 MED ORDER — AZELASTINE HCL 0.1 % NA SOLN: 2.0000 | NASAL | 1 refills | Status: DC | PRN

## 2020-03-12 NOTE — Progress Notes (Addendum)
Subjective:    Patient ID: Susan Allison, female    DOB: [birth year], 60 y.o.   MRN: [id number]  HPI 60 yo female in non acute distress. Left ear 7/10 on /off pain started a month ago saw her doctor, they saw some fluid behind TM,  This weekend it pain in left ear got worse. No discharge. No fever or chills, no cough or shortness of breath. Taking OTC Zyrtec and Flonase.    Allergies  Allergen Reactions  . Lisinopril     Possible cause of itching/ elevated liver enzymes - discontinued 06/03/17.  Cough     Review of Systems  Constitutional: Negative for chills and fever.  HENT: Positive for ear pain (left x 3 d). Negative for congestion, ear discharge, rhinorrhea and sore throat.   Respiratory: Negative for cough.   Allergic/Immunologic: Positive for environmental allergies.         Objective:   Physical Exam Vitals and nursing note reviewed.  Constitutional:      Appearance: Normal appearance.  HENT:     Head: Normocephalic and atraumatic.     Right Ear: Hearing, ear canal and external ear normal. No middle ear effusion.     Left Ear: Hearing, ear canal and external ear normal.  No middle ear effusion. Tympanic membrane is erythematous.     Mouth/Throat:     Mouth: Mucous membranes are moist.     Pharynx: Oropharynx is clear.  Eyes:     Extraocular Movements: Extraocular movements intact.     Conjunctiva/sclera: Conjunctivae normal.     Pupils: Pupils are equal, round, and reactive to light.  Cardiovascular:     Rate and Rhythm: Normal rate and regular rhythm.     Heart sounds: Normal heart sounds.  Pulmonary:     Effort: Pulmonary effort is normal.     Breath sounds: Normal breath sounds.  Musculoskeletal:        General: Normal range of motion.     Cervical back: Normal range of motion.  Skin:    General: Skin is warm and dry.     Capillary Refill: Capillary refill takes less than 2 seconds.  Neurological:     General: No focal deficit present.     Mental  Status: She is alert and oriented to person, place, and time. Mental status is at baseline.  Psychiatric:        Mood and Affect: Mood normal.        Behavior: Behavior normal.        Thought Content: Thought content normal.        Judgment: Judgment normal.    Patient touch ear a lot in room.     Assessment & Plan:  Otitis Media Left ear Eustachian Tube Dysfunction bilateral Meds ordered this encounter  Medications  . amoxicillin-clavulanate (AUGMENTIN) 875-125 MG tablet    Sig: Take 1 tablet by mouth 2 (two) times daily.    Dispense:  20 tablet    Refill:  0  . azelastine (ASTELIN) 0.1 % nasal spray    Sig: Place 2 sprays into both nostrils as needed. Use in each nostril as directed    Dispense:  30 mL    Refill:  1  Does not have anymore  of Azelastine NS, asked for refill. Recommend OTC Tylenol or Motrin for pain per package instructions. Return or follow up with PCP if not improving in  3-5 days.  Patient verbalizes understanding and has no questions at discharge.

## 2020-03-12 NOTE — Patient Instructions (Signed)
Otitis Media, Adult  Otitis media is a condition in which the middle ear is red and swollen (inflamed) and full of fluid. The middle ear is the part of the ear that contains bones for hearing as well as air that helps send sounds to the brain. The condition usually goes away on its own. What are the causes? This condition is caused by a blockage in the eustachian tube. The eustachian tube connects the middle ear to the back of the nose. It normally allows air into the middle ear. The blockage is caused by fluid or swelling. Problems that can cause blockage include:  A cold or infection that affects the nose, mouth, or throat.  Allergies.  An irritant, such as tobacco smoke.  Adenoids that have become large. The adenoids are soft tissue located in the back of the throat, behind the nose and the roof of the mouth.  Growth or swelling in the upper part of the throat, just behind the nose (nasopharynx).  Damage to the ear caused by change in pressure. This is called barotrauma. What are the signs or symptoms? Symptoms of this condition include:  Ear pain.  Fever.  Problems with hearing.  Being tired.  Fluid leaking from the ear.  Ringing in the ear. How is this treated? This condition can go away on its own within 3-5 days. But if the condition is caused by bacteria or does not go away on its own, or if it keeps coming back, your doctor may:  Give you antibiotic medicines.  Give you medicines for pain. Follow these instructions at home:  Take over-the-counter and prescription medicines only as told by your doctor.  If you were prescribed an antibiotic medicine, take it as told by your doctor. Do not stop taking the antibiotic even if you start to feel better.  Keep all follow-up visits as told by your doctor. This is important. Contact a doctor if:  You have bleeding from your nose.  There is a lump on your neck.  You are not feeling better in 5 days.  You feel worse  instead of better. Get help right away if:  You have pain that is not helped with medicine.  You have swelling, redness, or pain around your ear.  You get a stiff neck.  You cannot move part of your face (paralysis).  You notice that the bone behind your ear hurts when you touch it.  You get a very bad headache. Summary  Otitis media means that the middle ear is red, swollen, and full of fluid.  This condition usually goes away on its own.  If the problem does not go away, treatment may be needed. You may be given medicines to treat the infection or to treat your pain.  If you were prescribed an antibiotic medicine, take it as told by your doctor. Do not stop taking the antibiotic even if you start to feel better.  Keep all follow-up visits as told by your doctor. This is important. This information is not intended to replace advice given to you by your health care provider. Make sure you discuss any questions you have with your health care provider. Document Revised: 12/16/2018 Document Reviewed: 12/16/2018 Elsevier Patient Education  2021 [employer]  

## 2020-03-19 ENCOUNTER — Other Ambulatory Visit: Payer: Self-pay

## 2020-03-19 ENCOUNTER — Telehealth: Payer: BC Managed Care – PPO | Admitting: Medical

## 2020-03-19 NOTE — Progress Notes (Signed)
60 yo female in non acute distress consents to telemedicine appointment. Started  Saturday night with itching all over. She is taking Augmentin for her otitis media. Her ear feels better and she has completed 6.5 days of the Augmentin.She is to stop the Augmentin. And I have listed it as an allergy. I recommend that she take Zyrtec during the day and 1/2 a tablet of Benadryl at night. She did not sleep well last night due to the itching and burning of her skin.  She is to call back if her ear starts to hurt her again. Patient verbalizes understanding and has no questions at the end of our conversation.

## 2020-04-02 ENCOUNTER — Other Ambulatory Visit (INDEPENDENT_AMBULATORY_CARE_PROVIDER_SITE_OTHER): Payer: BC Managed Care – PPO

## 2020-04-02 ENCOUNTER — Other Ambulatory Visit: Payer: Self-pay

## 2020-04-02 ENCOUNTER — Other Ambulatory Visit: Payer: Self-pay | Admitting: Internal Medicine

## 2020-04-02 ENCOUNTER — Encounter: Payer: Self-pay | Admitting: Internal Medicine

## 2020-04-02 DIAGNOSIS — E039 Hypothyroidism, unspecified: Secondary | ICD-10-CM

## 2020-04-02 DIAGNOSIS — R7303 Prediabetes: Secondary | ICD-10-CM

## 2020-04-02 DIAGNOSIS — Z0000 Encounter for general adult medical examination without abnormal findings: Secondary | ICD-10-CM | POA: Diagnosis not present

## 2020-04-02 DIAGNOSIS — I10 Essential (primary) hypertension: Secondary | ICD-10-CM | POA: Diagnosis not present

## 2020-04-02 DIAGNOSIS — R748 Abnormal levels of other serum enzymes: Secondary | ICD-10-CM | POA: Insufficient documentation

## 2020-04-02 LAB — COMPREHENSIVE METABOLIC PANEL
ALT: 179 U/L — ABNORMAL HIGH (ref 0–35)
AST: 78 U/L — ABNORMAL HIGH (ref 0–37)
Albumin: 4.2 g/dL (ref 3.5–5.2)
Alkaline Phosphatase: 170 U/L — ABNORMAL HIGH (ref 39–117)
BUN: 10 mg/dL (ref 6–23)
CO2: 29 mEq/L (ref 19–32)
Calcium: 9.5 mg/dL (ref 8.4–10.5)
Chloride: 104 mEq/L (ref 96–112)
Creatinine, Ser: 1.03 mg/dL (ref 0.40–1.20)
GFR: 58.77 mL/min — ABNORMAL LOW (ref >60.00)
Glucose, Bld: 100 mg/dL — ABNORMAL HIGH (ref 70–99)
Potassium: 4 mEq/L (ref 3.5–5.1)
Sodium: 141 mEq/L (ref 135–145)
Total Bilirubin: 0.8 mg/dL (ref 0.2–1.2)
Total Protein: 7 g/dL (ref 6.0–8.3)

## 2020-04-02 LAB — HEMOGLOBIN A1C: Hgb A1c MFr Bld: 5.9 % (ref 4.6–6.5)

## 2020-04-02 LAB — LIPID PANEL
Cholesterol: 174 mg/dL (ref 0–200)
HDL: 33.4 mg/dL — ABNORMAL LOW (ref >39.00)
LDL Cholesterol: 116 mg/dL — ABNORMAL HIGH (ref 0–99)
NonHDL: 140.25
Total CHOL/HDL Ratio: 5
Triglycerides: 122 mg/dL (ref 0.0–149.0)
VLDL: 24.4 mg/dL (ref 0.0–40.0)

## 2020-04-02 LAB — CBC WITH DIFFERENTIAL/PLATELET
Basophils Absolute: 0 10*3/uL (ref 0.0–0.1)
Basophils Relative: 1.3 % (ref 0.0–3.0)
Eosinophils Absolute: 0.1 10*3/uL (ref 0.0–0.7)
Eosinophils Relative: 3.1 % (ref 0.0–5.0)
HCT: 38.3 % (ref 36.0–46.0)
Hemoglobin: 12.8 g/dL (ref 12.0–15.0)
Lymphocytes Relative: 45.8 % (ref 12.0–46.0)
Lymphs Abs: 1.5 10*3/uL (ref 0.7–4.0)
MCHC: 33.4 g/dL (ref 30.0–36.0)
MCV: 85.1 fl (ref 78.0–100.0)
Monocytes Absolute: 0.4 10*3/uL (ref 0.1–1.0)
Monocytes Relative: 11.1 % (ref 3.0–12.0)
Neutro Abs: 1.3 10*3/uL — ABNORMAL LOW (ref 1.4–7.7)
Neutrophils Relative %: 38.7 % — ABNORMAL LOW (ref 43.0–77.0)
Platelets: 266 10*3/uL (ref 150.0–400.0)
RBC: 4.51 Mil/uL (ref 3.87–5.11)
RDW: 14.4 % (ref 11.5–15.5)
WBC: 3.3 10*3/uL — ABNORMAL LOW (ref 4.0–10.5)

## 2020-04-02 LAB — TSH: TSH: 6.15 u[IU]/mL — ABNORMAL HIGH (ref 0.35–4.50)

## 2020-04-02 MED ORDER — LEVOTHYROXINE SODIUM 50 MCG PO TABS: 50.0000 ug | ORAL_TABLET | Freq: Every day | ORAL | 3 refills | Status: DC

## 2020-04-02 NOTE — Addendum Note (Signed)
Addended by: Quentin Ore on: 04/02/2020 03:45 PM   Modules accepted: Orders

## 2020-04-03 ENCOUNTER — Encounter: Payer: Self-pay | Admitting: Internal Medicine

## 2020-04-03 LAB — THYROID PEROXIDASE ANTIBODY: Thyroperoxidase Ab SerPl-aCnc: 696 IU/mL — ABNORMAL HIGH (ref <9)

## 2020-04-03 NOTE — Addendum Note (Signed)
Addended by: Quentin Ore on: 04/03/2020 10:55 AM   Modules accepted: Orders

## 2020-04-04 ENCOUNTER — Ambulatory Visit: Payer: BC Managed Care – PPO

## 2020-04-10 DIAGNOSIS — G8929 Other chronic pain: Secondary | ICD-10-CM | POA: Diagnosis not present

## 2020-04-10 DIAGNOSIS — M545 Low back pain, unspecified: Secondary | ICD-10-CM | POA: Diagnosis not present

## 2020-04-10 DIAGNOSIS — M542 Cervicalgia: Secondary | ICD-10-CM | POA: Diagnosis not present

## 2020-04-11 ENCOUNTER — Telehealth: Payer: Self-pay | Admitting: Internal Medicine

## 2020-04-11 DIAGNOSIS — I10 Essential (primary) hypertension: Secondary | ICD-10-CM

## 2020-04-11 MED ORDER — AMLODIPINE BESYLATE 2.5 MG PO TABS: 2.5000 mg | ORAL_TABLET | Freq: Every day | ORAL | 3 refills | Status: DC

## 2020-04-11 NOTE — Addendum Note (Signed)
Addended by: Elise Benne T on: 04/11/2020 10:05 AM   Modules accepted: Orders

## 2020-04-11 NOTE — Telephone Encounter (Signed)
pharmacy called to get a refill on amLODipine (NORVASC) 2.5 MG tablet

## 2020-04-16 ENCOUNTER — Other Ambulatory Visit: Payer: BC Managed Care – PPO

## 2020-04-16 ENCOUNTER — Ambulatory Visit
Admission: RE | Admit: 2020-04-16 | Discharge: 2020-04-16 | Disposition: A | Discharge status: Home / Self Care | Payer: BC Managed Care – PPO | Source: Ambulatory Visit | Attending: Internal Medicine | Admitting: Internal Medicine

## 2020-04-16 ENCOUNTER — Other Ambulatory Visit: Payer: Self-pay

## 2020-04-16 DIAGNOSIS — R748 Abnormal levels of other serum enzymes: Secondary | ICD-10-CM | POA: Diagnosis not present

## 2020-04-16 DIAGNOSIS — K8020 Calculus of gallbladder without cholecystitis without obstruction: Secondary | ICD-10-CM | POA: Diagnosis not present

## 2020-04-16 DIAGNOSIS — R7989 Other specified abnormal findings of blood chemistry: Secondary | ICD-10-CM | POA: Diagnosis not present

## 2020-04-16 DIAGNOSIS — R946 Abnormal results of thyroid function studies: Secondary | ICD-10-CM | POA: Diagnosis not present

## 2020-04-16 DIAGNOSIS — Z8719 Personal history of other diseases of the digestive system: Secondary | ICD-10-CM

## 2020-04-16 DIAGNOSIS — N281 Cyst of kidney, acquired: Secondary | ICD-10-CM | POA: Diagnosis not present

## 2020-04-17 ENCOUNTER — Other Ambulatory Visit (INDEPENDENT_AMBULATORY_CARE_PROVIDER_SITE_OTHER): Payer: BC Managed Care – PPO

## 2020-04-17 DIAGNOSIS — R748 Abnormal levels of other serum enzymes: Secondary | ICD-10-CM

## 2020-04-17 DIAGNOSIS — R7989 Other specified abnormal findings of blood chemistry: Secondary | ICD-10-CM | POA: Diagnosis not present

## 2020-04-17 DIAGNOSIS — Z8719 Personal history of other diseases of the digestive system: Secondary | ICD-10-CM | POA: Diagnosis not present

## 2020-04-17 LAB — COMPREHENSIVE METABOLIC PANEL
ALT: 117 U/L — ABNORMAL HIGH (ref 0–35)
AST: 54 U/L — ABNORMAL HIGH (ref 0–37)
Albumin: 4.5 g/dL (ref 3.5–5.2)
Alkaline Phosphatase: 137 U/L — ABNORMAL HIGH (ref 39–117)
BUN: 14 mg/dL (ref 6–23)
CO2: 30 mEq/L (ref 19–32)
Calcium: 9.6 mg/dL (ref 8.4–10.5)
Chloride: 102 mEq/L (ref 96–112)
Creatinine, Ser: 1.03 mg/dL (ref 0.40–1.20)
GFR: 58.75 mL/min — ABNORMAL LOW (ref >60.00)
Glucose, Bld: 79 mg/dL (ref 70–99)
Potassium: 3.7 mEq/L (ref 3.5–5.1)
Sodium: 140 mEq/L (ref 135–145)
Total Bilirubin: 0.5 mg/dL (ref 0.2–1.2)
Total Protein: 7 g/dL (ref 6.0–8.3)

## 2020-04-17 NOTE — Progress Notes (Signed)
PCP: McLean-Scocuzza, Pasty Spillers, MD   Chief Complaint  Patient presents with  . Gynecologic Exam    No concerns    HPI:      [name] is a 60 y.o. [phone] who LMP was No LMP recorded. Patient is postmenopausal., presents today for her annual examination.  Her menses are absent due to menopause. She does not have intermenstrual bleeding. She does not have vasomotor sx.   Sex activity: Rarely sex active. She does not have vaginal dryness.  Last Pap: 03/25/18  Results were: no abnormalities /neg HPV DNA.  Hx of STDs: none  Last mammogram: 07/27/19 Results were: normal--routine follow-up in 12 months There is a FH of breast cancer in her mat niece, genetic testing not indicated for pt. Pt unsure if niece had genetic testing. There is no FH of ovarian cancer. The patient does do self-breast exams.  Colonoscopy: colonoscopy 1/21 at [facility] without abnormalities.  Repeat due after 10 years per pt  Tobacco use: The patient denies current or previous tobacco use. Alcohol use: none  No drug use Exercise: min active  She does get adequate calcium and Vitamin D in her diet.  Labs with PCP.    Past Medical History:  Diagnosis Date  . Arthritis   . GERD (gastroesophageal reflux disease)   . Hypertension   . Joint pain   . Mastodynia   . Sinus problem     Past Surgical History:  Procedure Laterality Date  . COLONOSCOPY  2011, 2021  . ESOPHAGOGASTRODUODENOSCOPY N/A 08/14/2014   Procedure: ESOPHAGOGASTRODUODENOSCOPY (EGD);  Surgeon: [name], MD;  Location: [facility] ENDOSCOPY;  Service: Gastroenterology;  Laterality: N/A;    Family History  Problem Relation Age of Onset  . Breast cancer Other        40s, has contact  . Hypertension Mother   . Liver disease Mother        nash with cirrhosis  . Other Mother        brain tumor   . Prostate cancer Father 4  . Diabetes Brother   . Arthritis/Rheumatoid Sister   . Lupus Maternal Aunt   . Heart attack Maternal Grandmother      Social History   Socioeconomic History  . Marital status: Married    Spouse name: Not on file  . Number of children: 2  . Years of education: Not on file  . Highest education level: Bachelor's degree (e.g., BA, AB, BS)  Occupational History  . Not on file  Tobacco Use  . Smoking status: Never Smoker  . Smokeless tobacco: Never Used  Vaping Use  . Vaping Use: Never used  Substance and Sexual Activity  . Alcohol use: Never  . Drug use: Never  . Sexual activity: Yes    Birth control/protection: Post-menopausal  Other Topics Concern  . Not on file  Social History Narrative   Married.   2 children. 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    Lives at home with spouse & children   Left handed   Caffeine: no   Social Determinants of Health   Financial Resource Strain: Not on file  Food Insecurity: Not on file  Transportation Needs: Not on file  Physical Activity: Not on file  Stress: Not on file  Social Connections: Not on file  Intimate Partner Violence: Not on file    Current Meds  Medication Sig  . amLODipine (NORVASC) 2.5 MG tablet Take 1 tablet (2.5 mg total) by mouth daily at 6 PM.  . azelastine (ASTELIN) 0.1 % nasal spray Place 2 sprays into both nostrils as needed. Use in each nostril as directed  . calcium carbonate (OS-CAL) 600 MG TABS tablet Take 600 mg by mouth daily.   . cetirizine (ZYRTEC) 10 MG tablet Take 10 mg by mouth as needed.   . cholecalciferol (VITAMIN D) 1000 units tablet Take 1,000 Units by mouth daily.  . fluticasone (FLONASE) 50 MCG/ACT nasal spray Place 1-2 sprays into both nostrils daily.  . hydrochlorothiazide (HYDRODIURIL) 12.5 MG tablet Take 1 tablet (12.5 mg total) by mouth daily. In the am  . levothyroxine (SYNTHROID) 50 MCG tablet Take 1 tablet (50 mcg total) by mouth daily before breakfast. 30 min before food in the am      ROS:  Review of  Systems  Constitutional: Negative for fatigue, fever and unexpected weight change.  Respiratory: Negative for cough, shortness of breath and wheezing.   Cardiovascular: Negative for chest pain, palpitations and leg swelling.  Gastrointestinal: Negative for blood in stool, constipation, diarrhea, nausea and vomiting.  Endocrine: Negative for cold intolerance, heat intolerance and polyuria.  Genitourinary: Negative for dyspareunia, dysuria, flank pain, frequency, genital sores, hematuria, menstrual problem, pelvic pain, urgency, vaginal bleeding, vaginal discharge and vaginal pain.  Musculoskeletal: Positive for arthralgias. Negative for back pain, joint swelling and myalgias.  Skin: Negative for rash.  Neurological: Negative for dizziness, syncope, light-headedness, numbness and headaches.  Hematological: Negative for adenopathy.  Psychiatric/Behavioral: Negative for agitation, confusion, sleep disturbance and suicidal ideas. The patient is not nervous/anxious.      Objective: BP 110/80   Ht 5' (1.524 m)   Wt 129 lb (58.5 kg)   BMI 25.19 kg/m    Physical Exam Constitutional:      Appearance: She is well-developed.  Genitourinary:     Vulva normal.     Right Labia: No rash, tenderness or lesions.    Left Labia: No tenderness, lesions or rash.    No vaginal discharge, erythema or tenderness.      Right Adnexa: not tender and no mass present.    Left Adnexa: not tender and no mass present.    No cervical friability or polyp.     Uterus is not enlarged or tender.  Breasts:     Right: No mass, nipple discharge, skin change or tenderness.     Left: No mass, nipple discharge, skin change or tenderness.    Neck:     Thyroid: No thyromegaly.  Cardiovascular:     Rate and Rhythm: Normal rate and regular rhythm.     Heart sounds: Normal heart sounds. No murmur heard.   Pulmonary:     Effort: Pulmonary effort is normal.     Breath sounds: Normal breath sounds.  Abdominal:      Palpations: Abdomen is soft.     Tenderness: There is no abdominal tenderness. There is no guarding or rebound.  Musculoskeletal:        General: Normal range of motion.     Cervical back: Normal range of motion.  Lymphadenopathy:     Cervical: No cervical adenopathy.  Neurological:     General: No focal deficit present.     Mental Status: She is alert and oriented to person, place, and time.     Cranial Nerves: No cranial nerve deficit.  Skin:    General: Skin is warm and dry.  Psychiatric:        Mood and Affect: Mood normal.        Behavior: Behavior normal.        Thought Content: Thought content normal.        Judgment: Judgment normal.  Vitals reviewed.    Assessment/Plan: Encounter for annual routine gynecological examination  Encounter for screening mammogram for malignant neoplasm of breast - Plan: MM 3D SCREEN BREAST BILATERAL; pt to sched mammo          GYN counsel breast self exam, mammography screening, menopause, adequate intake of calcium and vitamin D, diet and exercise    F/U  Return in about 1 year (around 04/18/2021).  Melony Tenpas B. Annagrace Carr, PA-C 04/18/2020 1:56 PM

## 2020-04-18 ENCOUNTER — Encounter: Payer: Self-pay | Admitting: Obstetrics and Gynecology

## 2020-04-18 ENCOUNTER — Ambulatory Visit (INDEPENDENT_AMBULATORY_CARE_PROVIDER_SITE_OTHER): Payer: BC Managed Care – PPO | Admitting: Obstetrics and Gynecology

## 2020-04-18 ENCOUNTER — Other Ambulatory Visit: Payer: BC Managed Care – PPO

## 2020-04-18 ENCOUNTER — Other Ambulatory Visit: Payer: Self-pay

## 2020-04-18 VITALS — BP 110/80 | Ht 60.0 in | Wt 129.0 lb

## 2020-04-18 DIAGNOSIS — Z1231 Encounter for screening mammogram for malignant neoplasm of breast: Secondary | ICD-10-CM | POA: Diagnosis not present

## 2020-04-18 DIAGNOSIS — Z01419 Encounter for gynecological examination (general) (routine) without abnormal findings: Secondary | ICD-10-CM | POA: Diagnosis not present

## 2020-04-18 LAB — MICROALBUMIN / CREATININE URINE RATIO
Creatinine, Urine: 128 mg/dL
Microalb/Creat Ratio: 9 mg/g creat (ref 0–29)
Microalbumin, Urine: 11.9 ug/mL

## 2020-04-18 LAB — SODIUM, URINE, RANDOM: Sodium, Ur: 50 mmol/L

## 2020-04-18 NOTE — Patient Instructions (Signed)
I value your feedback and you entrusting us with your care. If you get a [facility] patient survey, I would appreciate you taking the time to let us know about your experience today. Thank you!  [facility]: [phone]      

## 2020-04-23 ENCOUNTER — Other Ambulatory Visit: Payer: Self-pay | Admitting: Internal Medicine

## 2020-04-23 DIAGNOSIS — R748 Abnormal levels of other serum enzymes: Secondary | ICD-10-CM

## 2020-05-09 ENCOUNTER — Other Ambulatory Visit: Payer: Self-pay

## 2020-05-09 DIAGNOSIS — I10 Essential (primary) hypertension: Secondary | ICD-10-CM

## 2020-05-09 MED ORDER — HYDROCHLOROTHIAZIDE 12.5 MG PO TABS: 12.5000 mg | ORAL_TABLET | Freq: Every day | ORAL | 1 refills | Status: DC

## 2020-05-16 ENCOUNTER — Encounter: Payer: Self-pay | Admitting: Internal Medicine

## 2020-05-16 ENCOUNTER — Other Ambulatory Visit: Payer: Self-pay

## 2020-05-16 ENCOUNTER — Ambulatory Visit: Payer: BC Managed Care – PPO | Admitting: Internal Medicine

## 2020-05-16 VITALS — BP 120/80 | HR 81 | Temp 98.1°F | Ht 60.0 in | Wt 128.0 lb

## 2020-05-16 DIAGNOSIS — I10 Essential (primary) hypertension: Secondary | ICD-10-CM

## 2020-05-16 DIAGNOSIS — R7989 Other specified abnormal findings of blood chemistry: Secondary | ICD-10-CM | POA: Diagnosis not present

## 2020-05-16 DIAGNOSIS — N281 Cyst of kidney, acquired: Secondary | ICD-10-CM

## 2020-05-16 DIAGNOSIS — R748 Abnormal levels of other serum enzymes: Secondary | ICD-10-CM

## 2020-05-16 DIAGNOSIS — H6983 Other specified disorders of Eustachian tube, bilateral: Secondary | ICD-10-CM

## 2020-05-16 DIAGNOSIS — K8020 Calculus of gallbladder without cholecystitis without obstruction: Secondary | ICD-10-CM | POA: Diagnosis not present

## 2020-05-16 DIAGNOSIS — D72819 Decreased white blood cell count, unspecified: Secondary | ICD-10-CM | POA: Insufficient documentation

## 2020-05-16 DIAGNOSIS — E039 Hypothyroidism, unspecified: Secondary | ICD-10-CM

## 2020-05-16 LAB — CBC WITH DIFFERENTIAL/PLATELET
Basophils Absolute: 0 10*3/uL (ref 0.0–0.1)
Basophils Relative: 1.4 % (ref 0.0–3.0)
Eosinophils Absolute: 0.1 10*3/uL (ref 0.0–0.7)
Eosinophils Relative: 2.7 % (ref 0.0–5.0)
HCT: 38 % (ref 36.0–46.0)
Hemoglobin: 12.6 g/dL (ref 12.0–15.0)
Lymphocytes Relative: 38.2 % (ref 12.0–46.0)
Lymphs Abs: 1.1 10*3/uL (ref 0.7–4.0)
MCHC: 33.2 g/dL (ref 30.0–36.0)
MCV: 84.6 fl (ref 78.0–100.0)
Monocytes Absolute: 0.3 10*3/uL (ref 0.1–1.0)
Monocytes Relative: 9.7 % (ref 3.0–12.0)
Neutro Abs: 1.4 10*3/uL (ref 1.4–7.7)
Neutrophils Relative %: 48 % (ref 43.0–77.0)
Platelets: 259 10*3/uL (ref 150.0–400.0)
RBC: 4.5 Mil/uL (ref 3.87–5.11)
RDW: 14 % (ref 11.5–15.5)
WBC: 2.9 10*3/uL — ABNORMAL LOW (ref 4.0–10.5)

## 2020-05-16 LAB — COMPREHENSIVE METABOLIC PANEL
ALT: 12 U/L (ref 0–35)
AST: 16 U/L (ref 0–37)
Albumin: 4.2 g/dL (ref 3.5–5.2)
Alkaline Phosphatase: 97 U/L (ref 39–117)
BUN: 11 mg/dL (ref 6–23)
CO2: 28 mEq/L (ref 19–32)
Calcium: 9.5 mg/dL (ref 8.4–10.5)
Chloride: 105 mEq/L (ref 96–112)
Creatinine, Ser: 0.98 mg/dL (ref 0.40–1.20)
GFR: 62.33 mL/min (ref >60.00)
Glucose, Bld: 89 mg/dL (ref 70–99)
Potassium: 3.8 mEq/L (ref 3.5–5.1)
Sodium: 141 mEq/L (ref 135–145)
Total Bilirubin: 0.5 mg/dL (ref 0.2–1.2)
Total Protein: 7 g/dL (ref 6.0–8.3)

## 2020-05-16 LAB — TSH: TSH: 2.19 u[IU]/mL (ref 0.35–4.50)

## 2020-05-16 MED ORDER — FLUTICASONE PROPIONATE 50 MCG/ACT NA SUSP: 1.0000 | Freq: Every day | NASAL | 11 refills | Status: AC

## 2020-05-16 MED ORDER — HYDROCHLOROTHIAZIDE 12.5 MG PO TABS: 12.5000 mg | ORAL_TABLET | Freq: Every day | ORAL | 3 refills | Status: DC

## 2020-05-16 NOTE — Progress Notes (Signed)
Chief Complaint  Patient presents with  . Follow-up   F/u  1. HTN controlled on norvasc 2.5 mg qd, hctz 12.5 mg qd  2. Hypothyroidism on levo 50 mcg qd  3. Leukopenia, elevated lfts, elevated AP with gallstone + [place] no sx's with gallstones 4. Chronic neck and back pain Pt helped improved now doing exercises at home    Review of Systems  Constitutional: Positive for weight loss.  HENT: Negative for hearing loss.   Eyes: Negative for blurred vision.  Respiratory: Negative for shortness of breath.   Cardiovascular: Negative for chest pain.  Gastrointestinal: Negative for abdominal pain.  Musculoskeletal: Negative for back pain, falls and neck pain.  Skin: Negative for rash.  Psychiatric/Behavioral: Negative for depression.   Past Medical History:  Diagnosis Date  . Arthritis   . GERD (gastroesophageal reflux disease)   . Hypertension   . Joint pain   . Mastodynia   . Sinus problem    Past Surgical History:  Procedure Laterality Date  . COLONOSCOPY  2011, 2021  . ESOPHAGOGASTRODUODENOSCOPY N/A 08/14/2014   Procedure: ESOPHAGOGASTRODUODENOSCOPY (EGD);  Surgeon: [name], MD;  Location: [facility] ENDOSCOPY;  Service: Gastroenterology;  Laterality: N/A;   Family History  Problem Relation Age of Onset  . Breast cancer Other        40s, has contact  . Hypertension Mother   . Liver disease Mother        nash with cirrhosis  . Other Mother        brain tumor   . Prostate cancer Father 55  . Diabetes Brother   . Arthritis/Rheumatoid Sister   . Lupus Maternal Aunt   . Heart attack Maternal Grandmother    Social History   Socioeconomic History  . Marital status: Married    Spouse name: Not on file  . Number of children: 2  . Years of education: Not on file  . Highest education level: Bachelor's degree (e.g., BA, AB, BS)  Occupational History  . Not on file  Tobacco Use  . Smoking status: Never Smoker  . Smokeless tobacco: Never Used  Vaping Use  . Vaping Use: Never used   Substance and Sexual Activity  . Alcohol use: Never  . Drug use: Never  . Sexual activity: Yes    Birth control/protection: Post-menopausal  Other Topics Concern  . Not on file  Social History Narrative   Married.   2 children. 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    Lives at home with spouse & children   Left handed   Caffeine: no   Social Determinants of Health   Financial Resource Strain: Not on file  Food Insecurity: Not on file  Transportation Needs: Not on file  Physical Activity: Not on file  Stress: Not on file  Social Connections: Not on file  Intimate Partner Violence: Not on file   Current Meds  Medication Sig  . amLODipine (NORVASC) 2.5 MG tablet Take 1 tablet (2.5 mg total) by mouth daily at 6 PM.  . azelastine (ASTELIN) 0.1 % nasal spray Place 2 sprays into both nostrils as needed. Use in each nostril as directed  . calcium carbonate (OS-CAL) 600 MG TABS tablet Take 600 mg by mouth daily.   . cetirizine (ZYRTEC) 10 MG tablet Take 10 mg by mouth as needed.   . cholecalciferol (VITAMIN D) 1000 units tablet Take 1,000 Units  by mouth daily.  Marland Kitchen levothyroxine (SYNTHROID) 50 MCG tablet Take 1 tablet (50 mcg total) by mouth daily before breakfast. 30 min before food in the am  . [DISCONTINUED] hydrochlorothiazide (HYDRODIURIL) 12.5 MG tablet Take 1 tablet (12.5 mg total) by mouth daily. In the am   Allergies  Allergen Reactions  . Augmentin [Amoxicillin-Pot Clavulanate] Itching    Itching that then caused whelts  . Lisinopril     Possible cause of itching/ elevated liver enzymes - discontinued 06/03/17.  Cough    Recent Results (from the past 2160 hour(s))  Thyroid peroxidase antibody     Status: Abnormal   Collection Time: 04/02/20  9:11 AM  Result Value Ref Range   Thyroperoxidase Ab SerPl-aCnc 696 (H) <9 IU/mL  Hemoglobin A1c     Status: None   Collection Time: 04/02/20   9:11 AM  Result Value Ref Range   Hgb A1c MFr Bld 5.9 4.6 - 6.5 %    Comment: Glycemic Control Guidelines for People with Diabetes:Non Diabetic:  <6%Goal of Therapy: <7%Additional Action Suggested:  >8%   TSH     Status: Abnormal   Collection Time: 04/02/20  9:11 AM  Result Value Ref Range   TSH 6.15 (H) 0.35 - 4.50 uIU/mL  CBC with Differential/Platelet     Status: Abnormal   Collection Time: 04/02/20  9:11 AM  Result Value Ref Range   WBC 3.3 (L) 4.0 - 10.5 K/uL   RBC 4.51 3.87 - 5.11 Mil/uL   Hemoglobin 12.8 12.0 - 15.0 g/dL   HCT 50.9 32.6 - 71.2 %   MCV 85.1 78.0 - 100.0 fl   MCHC 33.4 30.0 - 36.0 g/dL   RDW 45.8 09.9 - 83.3 %   Platelets 266.0 150.0 - 400.0 K/uL   Neutrophils Relative % 38.7 (L) 43.0 - 77.0 %   Lymphocytes Relative 45.8 12.0 - 46.0 %   Monocytes Relative 11.1 3.0 - 12.0 %   Eosinophils Relative 3.1 0.0 - 5.0 %   Basophils Relative 1.3 0.0 - 3.0 %   Neutro Abs 1.3 (L) 1.4 - 7.7 K/uL   Lymphs Abs 1.5 0.7 - 4.0 K/uL   Monocytes Absolute 0.4 0.1 - 1.0 K/uL   Eosinophils Absolute 0.1 0.0 - 0.7 K/uL   Basophils Absolute 0.0 0.0 - 0.1 K/uL  Lipid panel     Status: Abnormal   Collection Time: 04/02/20  9:11 AM  Result Value Ref Range   Cholesterol 174 0 - 200 mg/dL    Comment: ATP III Classification       Desirable:  < 200 mg/dL               Borderline High:  200 - 239 mg/dL          High:  > = 825 mg/dL   Triglycerides 053.9 0.0 - 149.0 mg/dL    Comment: Normal:  <767 mg/dLBorderline High:  150 - 199 mg/dL   HDL 34.19 (L) >37.90 mg/dL   VLDL 24.0 0.0 - 97.3 mg/dL   LDL Cholesterol 532 (H) 0 - 99 mg/dL   Total CHOL/HDL Ratio 5     Comment:                Men          Women1/2 Average Risk     3.4          3.3Average Risk          5.0  4.42X Average Risk          9.6          7.13X Average Risk          15.0          11.0                       NonHDL 140.25     Comment: NOTE:  Non-HDL goal should be 30 mg/dL higher than patient's LDL goal (i.e. LDL  goal of < 70 mg/dL, would have non-HDL goal of < 100 mg/dL)  Comprehensive metabolic panel     Status: Abnormal   Collection Time: 04/02/20  9:11 AM  Result Value Ref Range   Sodium 141 135 - 145 mEq/L   Potassium 4.0 3.5 - 5.1 mEq/L   Chloride 104 96 - 112 mEq/L   CO2 29 19 - 32 mEq/L   Glucose, Bld 100 (H) 70 - 99 mg/dL   BUN 10 6 - 23 mg/dL   Creatinine, Ser 0.25 0.40 - 1.20 mg/dL   Total Bilirubin 0.8 0.2 - 1.2 mg/dL   Alkaline Phosphatase 170 (H) 39 - 117 U/L   AST 78 (H) 0 - 37 U/L   ALT 179 (H) 0 - 35 U/L   Total Protein 7.0 6.0 - 8.3 g/dL   Albumin 4.2 3.5 - 5.2 g/dL   GFR 85.27 (L) >78.24 mL/min    Comment: Calculated using the CKD-EPI Creatinine Equation (2021)   Calcium 9.5 8.4 - 10.5 mg/dL  Sodium, urine, random     Status: None   Collection Time: 04/17/20  9:39 AM  Result Value Ref Range   Sodium, Ur 50 Not Estab. mmol/L  Microalbumin / creatinine urine ratio     Status: None   Collection Time: 04/17/20  9:39 AM  Result Value Ref Range   Creatinine, Urine 128.0 Not Estab. mg/dL   Microalbumin, Urine 23.5 Not Estab. ug/mL   Microalb/Creat Ratio 9 0 - 29 mg/g creat    Comment:                        Normal:                0 -  29                        Moderately increased: 30 - 300                        Severely increased:       >300   Comprehensive metabolic panel     Status: Abnormal   Collection Time: 04/17/20  9:39 AM  Result Value Ref Range   Sodium 140 135 - 145 mEq/L   Potassium 3.7 3.5 - 5.1 mEq/L   Chloride 102 96 - 112 mEq/L   CO2 30 19 - 32 mEq/L   Glucose, Bld 79 70 - 99 mg/dL   BUN 14 6 - 23 mg/dL   Creatinine, Ser 3.61 0.40 - 1.20 mg/dL   Total Bilirubin 0.5 0.2 - 1.2 mg/dL   Alkaline Phosphatase 137 (H) 39 - 117 U/L   AST 54 (H) 0 - 37 U/L   ALT 117 (H) 0 - 35 U/L   Total Protein 7.0 6.0 - 8.3 g/dL   Albumin 4.5 3.5 - 5.2 g/dL   GFR 44.31 (L) >54.00 mL/min  Comment: Calculated using the CKD-EPI Creatinine Equation (2021)   Calcium 9.6  8.4 - 10.5 mg/dL   Objective  Body mass index is 25 kg/m. Wt Readings from Last 3 Encounters:  05/16/20 128 lb (58.1 kg)  04/18/20 129 lb (58.5 kg)  03/12/20 133 lb 9.6 oz (60.6 kg)   Temp Readings from Last 3 Encounters:  05/16/20 98.1 F (36.7 C) (Oral)  03/12/20 98 F (36.7 C) (Oral)  01/19/20 98.7 F (37.1 C) (Oral)   BP Readings from Last 3 Encounters:  05/16/20 120/80  04/18/20 110/80  03/12/20 (!) 150/76   Pulse Readings from Last 3 Encounters:  05/16/20 81  03/12/20 77  01/19/20 75    Physical Exam Vitals and nursing note reviewed.  Constitutional:      Appearance: Normal appearance. She is well-developed, well-groomed and overweight.  HENT:     Head: Normocephalic and atraumatic.  Cardiovascular:     Rate and Rhythm: Normal rate and regular rhythm.     Heart sounds: Normal heart sounds. No murmur heard.   Pulmonary:     Effort: Pulmonary effort is normal.     Breath sounds: Normal breath sounds.  Abdominal:     Tenderness: There is no abdominal tenderness.  Skin:    General: Skin is warm and dry.  Neurological:     General: No focal deficit present.     Mental Status: She is alert and oriented to person, place, and time.     Gait: Gait normal.  Psychiatric:        Attention and Perception: Attention and perception normal.        Mood and Affect: Mood and affect normal.        Speech: Speech normal.        Behavior: Behavior normal. Behavior is cooperative.        Thought Content: Thought content normal.        Cognition and Memory: Cognition and memory normal.        Judgment: Judgment normal.     Assessment  Plan  Essential hypertension - Plan: hydrochlorothiazide (HYDRODIURIL) 12.5 MG tablet, norvasc 2.5 mg qd controlled Comprehensive metabolic panel, CBC with Differential/Platelet  Gallstones - Plan: Comprehensive metabolic panel W/o sx's currently  Incidental Benign cyst of right kidney  Dysfunction of both eustachian tubes - Plan:  fluticasone (FLONASE) 50 MCG/ACT nasal spray prn and zyrtec can also use astelin  Elevated alkaline phosphatase level could be due to gallstones will monitor- Plan: Comprehensive metabolic panel, CBC with Differential/Platelet  Elevated liver enzymes - Plan: Comprehensive metabolic panel, CBC with Differential/Platelet  Hypothyroidism, unspecified type - Plan: TSH Levo 50 mcg qd   Leukopenia, likely benign - Plan: CBC with Differential/Platelet, Pathologist smear review  Elevated serum creatinine - Plan: Comprehensive metabolic panel   HM utd flu shot Tdap utd Disc shingrix todayagain pt declines covid 3/3 pfizer disc booster   Hep C negative  Declines HIV   Pap 03/25/2018 neg pap neg HPV ob/g[facility]  mammo6/26/20 negorder in from ob/gyn, 07/27/19 neg  3/22/11colonoscopy -normal f/u in 10 years -had 02/11/19 diver/ih repeat 10 years no FH   DEXA 03/13/10 normal do another age 86  rec healthy diet and exercise   Bilateral carpal tunnel syndrome Rx wrist braces prev as of 05/16/20 better   Dysfunction of both eustachian tubes F/u ENT prn  Consider steroids orally   Chronic cervical pain Chronic low back pain, unspecified back pain laterality, unspecified whether sciatica present MRI C and L  spine will let know when ready to order  MRI 02/22/20 cervical  IMPRESSION: 1. Subtle left foraminal disc protrusion at C7-T1, potentially affecting the transiting left C8 nerve root. 2. Right eccentric disc osteophyte complex at C5-6 with resultant mild canal and right C6 foraminal stenosis. 3. Small central disc protrusion at C4-5 with resultant mild spinal stenosis and minimal flattening of the ventral cord. 4. Mild left C7 foraminal stenosis related to disc bulge and uncovertebral disease.   Electronically Signed By: Rise MuBenjamin McClintock M.D. On: 02/22/2020 19:43   MRI lumbar  IMPRESSION: 1. Disc bulge with facet hypertrophy at L4-5 with  resultant mild canal and bilateral lateral recess stenosis, with mild to moderate bilateral L4 foraminal narrowing. 2. Subtle right extraforaminal disc protrusion at L2-3, closely approximating and potentially irritating the exiting right L2 nerve root.   Electronically Signed By: Rise MuBenjamin McClintock M.D. On: 02/22/2020 19:50   Provider: Dr. French Anaracy McLean-Scocuzza-Internal Medicine

## 2020-05-16 NOTE — Patient Instructions (Signed)
Cholelithiasis  Cholelithiasis is a disease in which gallstones form in the gallbladder. The gallbladder is an organ that stores bile. Bile is a fluid that helps to digest fats. Gallstones begin as small crystals and can slowly grow into stones. They may cause no symptoms until they block the gallbladder duct, or cystic duct, when the gallbladder tightens (contracts) after food is eaten. This can cause pain and is known as a gallbladder attack, or biliary colic. There are two main types of gallstones:  Cholesterol stones. These are the most common type of gallstone. These stones are made of hardened cholesterol and are usually yellow-green in color. Cholesterol is a fat-like substance that is made in the liver.  Pigment stones. These are dark in color and are made of a red-yellow substance, called bilirubin,that forms when hemoglobin from red blood cells breaks down. What are the causes? This condition may be caused by an imbalance in the different parts that make bile. This can happen if the bile:  Has too much bilirubin. This can happen in certain blood diseases, such as sickle cell anemia.  Has too much cholesterol.  Does not have enough bile salts. These salts help the body absorb and digest fats. In some cases, this condition can also be caused by the gallbladder not emptying completely or often enough. This is common during pregnancy. What increases the risk? The following factors may make you more likely to develop this condition:  Being female.  Having multiple pregnancies. Health care providers sometimes advise removing diseased gallbladders before future pregnancies.  Eating a diet that is heavy in fried foods, fat, and refined carbohydrates, such as white bread and white rice.  Being obese.  Being older than age 40.  Using medicines that contain female hormones (estrogen) for a long time.  Losing weight quickly.  Having a family history of gallstones.  Having certain  medical problems, such as: ? Diabetes mellitus. ? Cystic fibrosis. ? Crohn's disease. ? Cirrhosis or other long-term (chronic) liver disease. ? Certain blood diseases, such as sickle cell anemia or leukemia. What are the signs or symptoms? In many cases, having gallstones causes no symptoms. When you have gallstones but do not have symptoms, you have silent gallstones. If a gallstone blocks your bile duct, it can cause a gallbladder attack. The main symptom of a gallbladder attack is sudden pain in the upper right part of the abdomen. The pain:  Usually comes at night or after eating.  Can last for one hour or more.  Can spread to your right shoulder, back, or chest.  Can feel like indigestion. This is discomfort, burning, or fullness in your upper abdomen. If the bile duct is blocked for more than a few hours, it can cause an infection or inflammation of your gallbladder (cholecystitis), liver, or pancreas. This can cause:  Nausea or vomiting.  Bloating.  Pain in your abdomen that lasts for 5 hours or longer.  Tenderness in your upper abdomen, often in the upper right section and under your rib cage.  Fever or chills.  Skin or the white parts of your eyes turning yellow (jaundice). This usually happens when a stone has blocked bile from passing through the common bile duct.  Dark urine or light-colored stools. How is this diagnosed? This condition may be diagnosed based on:  A physical exam.  Your medical history.  Ultrasound.  CT scan.  MRI. You may also have other tests, including:  Blood tests to check for signs of an   infection or inflammation.  Cholescintigraphy, or HIDA scan. This is a scan of your gallbladder and bile ducts (biliary system) using non-harmful radioactive material and special cameras that can see the radioactive material.  Endoscopic retrograde cholangiopancreatogram. This involves inserting a small tube with a camera on the end (endoscope)  through your mouth to look at bile ducts and check for blockages. How is this treated? Treatment for this condition depends on the severity of the condition. Silent gallstones do not need treatment. Treatment may be needed if a blockage causes a gallbladder attack or other symptoms. Treatment may include:  Home care, if symptoms are not severe. ? During a simple gallbladder attack, stop eating and drinking for 12-24 hours (except for water and clear liquids). This helps to "cool down" your gallbladder. After 1 or 2 days, you can start to eat a diet of simple or clear foods, such as broths and crackers. ? You may also need medicines for pain or nausea or both. ? If you have cholecystitis and an infection, you will need antibiotics.  A hospital stay, if needed for pain control or for cholecystitis with severe infection.  Cholecystectomy, or surgery to remove your gallbladder. This is the most common treatment if all other treatments have not worked.  Medicines to break up gallstones. These are most effective at treating small gallstones. Medicines may be used for up to 6-12 months.  Endoscopic retrograde cholangiopancreatogram. A small basket can be attached to the endoscope and used to capture and remove gallstones, mainly those that are in the common bile duct. Follow these instructions at home: Medicines  Take over-the-counter and prescription medicines only as told by your health care provider.  If you were prescribed an antibiotic medicine, take it as told by your health care provider. Do not stop taking the antibiotic even if you start to feel better.  Ask your health care provider if the medicine prescribed to you requires you to avoid driving or using machinery. Eating and drinking  Drink enough fluid to keep your urine pale yellow. This is important during a gallbladder attack. Water and clear liquids are preferred.  Follow a healthy diet. This includes: ? Reducing fatty foods,  such as fried food and foods high in cholesterol. ? Reducing refined carbohydrates, such as white bread and white rice. ? Eating more fiber. Aim for foods such as almonds, fruit, and beans. Alcohol use  If you drink alcohol: ? Limit how much you use to:  0-1 drink a day for nonpregnant women.  0-2 drinks a day for men. ? Be aware of how much alcohol is in your drink. In the [place] one drink equals one 12 oz bottle of beer (355 mL), one 5 oz glass of wine (148 mL), or one 1 oz glass of hard liquor (44 mL). General instructions  Do not use any products that contain nicotine or tobacco, such as cigarettes, e-cigarettes, and chewing tobacco. If you need help quitting, ask your health care provider.  Maintain a healthy weight.  Keep all follow-up visits as told by your health care provider. These may include consultations with a surgeon or specialist. This is important. Where to find more information  National Institute of Diabetes and Digestive and Kidney Diseases: www.niddk.nih.gov Contact a health care provider if:  You think you have had a gallbladder attack.  You have been diagnosed with silent gallstones and you develop pain in your abdomen or indigestion.  You begin to have attacks more often.  You have   dark urine or light-colored stools. Get help right away if:  You have pain from a gallbladder attack that lasts for more than 2 hours.  You have pain in your abdomen that lasts for more than 5 hours or is getting worse.  You have a fever or chills.  You have nausea and vomiting that do not go away.  You develop jaundice. Summary  Cholelithiasis is a disease in which gallstones form in the gallbladder.  This condition may be caused by an imbalance in the different parts that make bile. This can happen if your bile has too much bilirubin or cholesterol, or does not have enough bile salts.  Treatment for gallstones depends on the severity of the condition. Silent  gallstones do not need treatment.  If gallstones cause a gallbladder attack or other symptoms, treatment usually involves not eating or drinking anything. Treatment may also include pain medicines and antibiotics, and it sometimes includes a hospital stay.  Surgery to remove the gallbladder is common if all other treatments have not worked. This information is not intended to replace advice given to you by your health care provider. Make sure you discuss any questions you have with your health care provider. Document Revised: 12/06/2018 Document Reviewed: 12/06/2018 Elsevier Patient Education  2021 Elsevier Inc.  

## 2020-05-17 LAB — PATHOLOGIST SMEAR REVIEW

## 2020-05-21 NOTE — Addendum Note (Signed)
Addended by: Quentin Ore on: 05/21/2020 07:30 AM   Modules accepted: Orders

## 2020-05-30 ENCOUNTER — Inpatient Hospital Stay: Payer: BC Managed Care – PPO

## 2020-05-30 ENCOUNTER — Inpatient Hospital Stay: Payer: BC Managed Care – PPO | Admitting: Oncology

## 2020-07-11 ENCOUNTER — Ambulatory Visit: Payer: BC Managed Care – PPO | Admitting: Internal Medicine

## 2020-07-19 ENCOUNTER — Ambulatory Visit: Payer: BC Managed Care – PPO | Admitting: Internal Medicine

## 2020-08-09 ENCOUNTER — Other Ambulatory Visit: Payer: Self-pay

## 2020-08-09 ENCOUNTER — Ambulatory Visit
Admission: RE | Admit: 2020-08-09 | Discharge: 2020-08-09 | Disposition: A | Discharge status: Home / Self Care | Payer: BC Managed Care – PPO | Source: Ambulatory Visit | Attending: Obstetrics and Gynecology | Admitting: Obstetrics and Gynecology

## 2020-08-09 DIAGNOSIS — Z1231 Encounter for screening mammogram for malignant neoplasm of breast: Secondary | ICD-10-CM | POA: Insufficient documentation

## 2020-09-19 ENCOUNTER — Telehealth: Payer: Self-pay

## 2020-09-19 NOTE — Telephone Encounter (Signed)
Pt called and states that she needs another referral to oncology/hematology. She states that there was one sent over months ago but she was having to take care of her father. Please advise.

## 2020-09-19 NOTE — Telephone Encounter (Signed)
Susan Allison is a pt of Dr French Ana. She placed an order for hematology referral.  She is ready for referral.  Do I need to place a new order.  Just let me know.  Thanks

## 2020-09-26 ENCOUNTER — Other Ambulatory Visit: Payer: Self-pay

## 2020-09-26 ENCOUNTER — Encounter: Payer: Self-pay | Admitting: Oncology

## 2020-09-26 ENCOUNTER — Inpatient Hospital Stay: Payer: BC Managed Care – PPO

## 2020-09-26 ENCOUNTER — Inpatient Hospital Stay: Payer: BC Managed Care – PPO | Attending: Oncology | Admitting: Oncology

## 2020-09-26 VITALS — BP 143/74 | HR 89 | Temp 98.2°F | Resp 17 | Wt 126.0 lb

## 2020-09-26 DIAGNOSIS — Z803 Family history of malignant neoplasm of breast: Secondary | ICD-10-CM | POA: Diagnosis not present

## 2020-09-26 DIAGNOSIS — Z780 Asymptomatic menopausal state: Secondary | ICD-10-CM | POA: Diagnosis not present

## 2020-09-26 DIAGNOSIS — D696 Thrombocytopenia, unspecified: Secondary | ICD-10-CM | POA: Diagnosis not present

## 2020-09-26 DIAGNOSIS — D72829 Elevated white blood cell count, unspecified: Secondary | ICD-10-CM

## 2020-09-26 DIAGNOSIS — D72819 Decreased white blood cell count, unspecified: Secondary | ICD-10-CM | POA: Diagnosis not present

## 2020-09-26 HISTORY — DX: Elevated white blood cell count, unspecified: D72.829

## 2020-09-26 LAB — CBC WITH DIFFERENTIAL/PLATELET
Abs Immature Granulocytes: 0.01 10*3/uL (ref 0.00–0.07)
Basophils Absolute: 0 10*3/uL (ref 0.0–0.1)
Basophils Relative: 1 %
Eosinophils Absolute: 0.1 10*3/uL (ref 0.0–0.5)
Eosinophils Relative: 2 %
HCT: 38.8 % (ref 36.0–46.0)
Hemoglobin: 12.5 g/dL (ref 12.0–15.0)
Immature Granulocytes: 0 %
Lymphocytes Relative: 42 %
Lymphs Abs: 1.4 10*3/uL (ref 0.7–4.0)
MCH: 28.2 pg (ref 26.0–34.0)
MCHC: 32.2 g/dL (ref 30.0–36.0)
MCV: 87.4 fL (ref 80.0–100.0)
Monocytes Absolute: 0.4 10*3/uL (ref 0.1–1.0)
Monocytes Relative: 11 %
Neutro Abs: 1.5 10*3/uL — ABNORMAL LOW (ref 1.7–7.7)
Neutrophils Relative %: 44 %
Platelets: 285 10*3/uL (ref 150–400)
RBC: 4.44 MIL/uL (ref 3.87–5.11)
RDW: 13.9 % (ref 11.5–15.5)
WBC: 3.5 10*3/uL — ABNORMAL LOW (ref 4.0–10.5)
nRBC: 0 % (ref 0.0–0.2)

## 2020-09-26 LAB — COMPREHENSIVE METABOLIC PANEL
ALT: 14 U/L (ref 0–44)
AST: 18 U/L (ref 15–41)
Albumin: 4.4 g/dL (ref 3.5–5.0)
Alkaline Phosphatase: 106 U/L (ref 38–126)
Anion gap: 7 (ref 5–15)
BUN: 11 mg/dL (ref 8–23)
CO2: 28 mmol/L (ref 22–32)
Calcium: 9 mg/dL (ref 8.9–10.3)
Chloride: 100 mmol/L (ref 98–111)
Creatinine, Ser: 1.08 mg/dL — ABNORMAL HIGH (ref 0.44–1.00)
GFR, Estimated: 58 mL/min — ABNORMAL LOW (ref >60)
Glucose, Bld: 83 mg/dL (ref 70–99)
Potassium: 3.6 mmol/L (ref 3.5–5.1)
Sodium: 135 mmol/L (ref 135–145)
Total Bilirubin: 0.3 mg/dL (ref 0.3–1.2)
Total Protein: 7.8 g/dL (ref 6.5–8.1)

## 2020-09-26 LAB — VITAMIN B12: Vitamin B-12: 244 pg/mL (ref 180–914)

## 2020-09-26 LAB — HIV ANTIBODY (ROUTINE TESTING W REFLEX): HIV Screen 4th Generation wRfx: NONREACTIVE

## 2020-09-26 LAB — TECHNOLOGIST SMEAR REVIEW
Plt Morphology: NORMAL
RBC MORPHOLOGY: NORMAL
WBC MORPHOLOGY: NORMAL

## 2020-09-26 LAB — LACTATE DEHYDROGENASE: LDH: 108 U/L (ref 98–192)

## 2020-09-26 LAB — FOLATE: Folate: 17.9 ng/mL (ref >5.9)

## 2020-09-26 NOTE — Progress Notes (Signed)
Patient here for initial oncology appointment, expresses concerns of fatigue 

## 2020-09-26 NOTE — Progress Notes (Signed)
Hematology/Oncology Consult note [facility] Telephone:([phone] [phone]   Patient Care Team: McLean-Scocuzza, Pasty Spillers, MD as PCP - General (Internal Medicine)  REFERRING PROVIDER: McLean-Scocuzza, French Ana *  CHIEF COMPLAINTS/REASON FOR VISIT:  Evaluation of leukopenia  HISTORY OF PRESENTING ILLNESS:  Susan Allison is a 60 y.o. female who was seen in consultation at the request of McLean-Scocuzza, French Ana * for evaluation of leukopenia. Reviewed patient's recent labs.  05/16/2020 patient has low total WBC count was 2.9, normal differential. Previous lab records reviewed.  04/02/2020, patient had a slight leukopenia of 3.3, mild neutropenia with  ANC 1.3.  No aggravating or improving factors.  Associated symptoms:  denies fatigue, weight loss, fever, chills, frequent infection.  History hepatitis or HIV infection: Denies History of chronic liver disease: Denies History of blood transfusion: Denies Alcohol consumption: Denies Diet Vegetarian or Vegan: Denies Herbal medication: Denies    Review of Systems  Constitutional:  Negative for appetite change, chills, fatigue and fever.  HENT:   Negative for hearing loss and voice change.   Eyes:  Negative for eye problems.  Respiratory:  Negative for chest tightness and cough.   Cardiovascular:  Negative for chest pain.  Gastrointestinal:  Negative for abdominal distention, abdominal pain and blood in stool.  Endocrine: Negative for hot flashes.  Genitourinary:  Negative for difficulty urinating and frequency.   Musculoskeletal:  Negative for arthralgias.  Skin:  Negative for itching and rash.  Neurological:  Negative for extremity weakness.  Hematological:  Negative for adenopathy.  Psychiatric/Behavioral:  Negative for confusion.    MEDICAL HISTORY:  Past Medical History:  Diagnosis Date   Arthritis    GERD (gastroesophageal reflux disease)    Hypertension    Joint pain    Leukocytosis  09/26/2020   New dx   Mastodynia    Sinus problem     SURGICAL HISTORY: Past Surgical History:  Procedure Laterality Date   COLONOSCOPY  2011, 2021   ESOPHAGOGASTRODUODENOSCOPY N/A 08/14/2014   Procedure: ESOPHAGOGASTRODUODENOSCOPY (EGD);  Surgeon: [name], MD;  Location: [facility] ENDOSCOPY;  Service: Gastroenterology;  Laterality: N/A;    SOCIAL HISTORY: Social History   Socioeconomic History   Marital status: Married    Spouse name: Not on file   Number of children: 2   Years of education: Not on file   Highest education level: Bachelor's degree (e.g., BA, AB, BS)  Occupational History   Not on file  Tobacco Use   Smoking status: Never   Smokeless tobacco: Never  Vaping Use   Vaping Use: Never used  Substance and Sexual Activity   Alcohol use: Never   Drug use: Never   Sexual activity: Yes    Birth control/protection: Post-menopausal  Other Topics Concern   Not on file  Social History Narrative   Married.   2 children. 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    Lives at home with spouse & children   Left handed   Caffeine: no   Social Determinants of Health   Financial Resource Strain: Not on file  Food Insecurity: Not on file  Transportation Needs: Not on file  Physical Activity: Not on file  Stress: Not on file  Social Connections: Not on file  Intimate Partner Violence: Not on file    FAMILY HISTORY: Family History  Problem Relation Age of Onset   Breast cancer Other  40s, has contact   Hypertension Mother    Liver disease Mother        nash with cirrhosis   Other Mother        brain tumor    Prostate cancer Father 53   Diabetes Brother    Arthritis/Rheumatoid Sister    Lupus Maternal Aunt    Heart attack Maternal Grandmother     ALLERGIES:  is allergic to augmentin [amoxicillin-pot clavulanate] and lisinopril.  MEDICATIONS:  Current Outpatient  Medications  Medication Sig Dispense Refill   amLODipine (NORVASC) 2.5 MG tablet Take 1 tablet (2.5 mg total) by mouth daily at 6 PM. 90 tablet 3   azelastine (ASTELIN) 0.1 % nasal spray Place 2 sprays into both nostrils as needed. Use in each nostril as directed 30 mL 1   calcium carbonate (OS-CAL) 600 MG TABS tablet Take 600 mg by mouth daily.      cetirizine (ZYRTEC) 10 MG tablet Take 10 mg by mouth as needed.      cholecalciferol (VITAMIN D) 1000 units tablet Take 1,000 Units by mouth daily.     fluticasone (FLONASE) 50 MCG/ACT nasal spray Place 1-2 sprays into both nostrils daily. 16 g 11   hydrochlorothiazide (HYDRODIURIL) 12.5 MG tablet Take 1 tablet (12.5 mg total) by mouth daily. In the am 90 tablet 3   levothyroxine (SYNTHROID) 50 MCG tablet Take 1 tablet (50 mcg total) by mouth daily before breakfast. 30 min before food in the am 90 tablet 3   cyclobenzaprine (FLEXERIL) 5 MG tablet 1/2 to 1 tablet at bedtime, will cause sedation. (Patient not taking: No sig reported) 15 tablet 0   No current facility-administered medications for this visit.     PHYSICAL EXAMINATION: ECOG PERFORMANCE STATUS: 0 - Asymptomatic Vitals:   09/26/20 1129  BP: (!) 143/74  Pulse: 89  Resp: 17  Temp: 98.2 F (36.8 C)  SpO2: 100%   Filed Weights   09/26/20 1129  Weight: 126 lb (57.2 kg)    Physical Exam Constitutional:      General: She is not in acute distress. HENT:     Head: Normocephalic and atraumatic.  Eyes:     General: No scleral icterus. Cardiovascular:     Rate and Rhythm: Normal rate and regular rhythm.     Heart sounds: Normal heart sounds.  Pulmonary:     Effort: Pulmonary effort is normal. No respiratory distress.     Breath sounds: No wheezing.  Abdominal:     General: Bowel sounds are normal. There is no distension.     Palpations: Abdomen is soft.  Musculoskeletal:        General: No deformity. Normal range of motion.     Cervical back: Normal range of motion and  neck supple.  Skin:    General: Skin is warm and dry.     Findings: No erythema or rash.  Neurological:     Mental Status: She is alert and oriented to person, place, and time. Mental status is at baseline.     Cranial Nerves: No cranial nerve deficit.     Coordination: Coordination normal.  Psychiatric:        Mood and Affect: Mood normal.    RADIOGRAPHIC STUDIES: I have personally reviewed the radiological images as listed and agreed with the findings in the report.  MM 3D SCREEN BREAST BILATERAL  Result Date: 08/11/2020 CLINICAL DATA:  Screening. EXAM: DIGITAL SCREENING BILATERAL MAMMOGRAM WITH TOMOSYNTHESIS AND CAD TECHNIQUE: Bilateral screening digital craniocaudal and  mediolateral oblique mammograms were obtained. Bilateral screening digital breast tomosynthesis was performed. The images were evaluated with computer-aided detection. COMPARISON:  Previous exam(s). ACR Breast Density Category b: There are scattered areas of fibroglandular density. FINDINGS: There are no findings suspicious for malignancy. IMPRESSION: No mammographic evidence of malignancy. A result letter of this screening mammogram will be mailed directly to the patient. RECOMMENDATION: Screening mammogram in one year. (Code:SM-B-01Y) BI-RADS CATEGORY  1: Negative. Electronically Signed   By: Frederico HammanMichelle  Collins M.D.   On: 08/11/2020 15:19     LABORATORY DATA:  I have reviewed the data as listed Lab Results  Component Value Date   WBC 3.5 (L) 09/26/2020   HGB 12.5 09/26/2020   HCT 38.8 09/26/2020   MCV 87.4 09/26/2020   PLT 285 09/26/2020   Recent Labs    04/17/20 0939 05/16/20 0837 09/26/20 1209  NA 140 141 135  K 3.7 3.8 3.6  CL 102 105 100  CO2 30 28 28   GLUCOSE 79 89 83  BUN 14 11 11   CREATININE 1.03 0.98 1.08*  CALCIUM 9.6 9.5 9.0  GFRNONAA  --   --  58*  PROT 7.0 7.0 7.8  ALBUMIN 4.5 4.2 4.4  AST 54* 16 18  ALT 117* 12 14  ALKPHOS 137* 97 106  BILITOT 0.5 0.5 0.3   Iron/TIBC/Ferritin/  %Sat No results found for: IRON, TIBC, FERRITIN, IRONPCTSAT   RADIOGRAPHIC STUDIES: I have personally reviewed the radiological images as listed and agreed with the findings in the report. MM 3D SCREEN BREAST BILATERAL  Result Date: 08/11/2020 CLINICAL DATA:  Screening. EXAM: DIGITAL SCREENING BILATERAL MAMMOGRAM WITH TOMOSYNTHESIS AND CAD TECHNIQUE: Bilateral screening digital craniocaudal and mediolateral oblique mammograms were obtained. Bilateral screening digital breast tomosynthesis was performed. The images were evaluated with computer-aided detection. COMPARISON:  Previous exam(s). ACR Breast Density Category b: There are scattered areas of fibroglandular density. FINDINGS: There are no findings suspicious for malignancy. IMPRESSION: No mammographic evidence of malignancy. A result letter of this screening mammogram will be mailed directly to the patient. RECOMMENDATION: Screening mammogram in one year. (Code:SM-B-01Y) BI-RADS CATEGORY  1: Negative. Electronically Signed   By: Frederico HammanMichelle  Collins M.D.   On: 08/11/2020 15:19     ASSESSMENT & PLAN:  1. Leukopenia, unspecified type    I discussed with patient that the differential diagnosis of leukopenia is broad, including infection, inflammation, nutrition deficiency, malignant etiology including underlying bone morrow disorders, ethnic neutropenia, autoimmune disease.  For the work up of patient's thrombocytopenia, I recommend checking CBC;CMP, LDH; smear review, folate, Vitamin B12, HIV, ANA,  flowcytometry   # Patient follow-up with me in approximately 2 weeks to review the above results.   Orders Placed This Encounter  Procedures   Vitamin B12    Standing Status:   Future    Number of Occurrences:   1    Standing Expiration Date:   09/26/2021   Folate    Standing Status:   Future    Number of Occurrences:   1    Standing Expiration Date:   09/26/2021   CBC with Differential/Platelet    Standing Status:   Future    Number of  Occurrences:   1    Standing Expiration Date:   09/26/2021   Comprehensive metabolic panel    Standing Status:   Future    Number of Occurrences:   1    Standing Expiration Date:   09/26/2021   Technologist smear review    Standing Status:  Future    Number of Occurrences:   1    Standing Expiration Date:   09/26/2021   Lactate dehydrogenase    Standing Status:   Future    Number of Occurrences:   1    Standing Expiration Date:   09/26/2021   Flow cytometry panel-leukemia/lymphoma work-up    Standing Status:   Future    Number of Occurrences:   1    Standing Expiration Date:   09/26/2021   ANA, IFA (with reflex)    Standing Status:   Future    Number of Occurrences:   1    Standing Expiration Date:   09/26/2021   HIV Antibody (routine testing w rflx)    Standing Status:   Future    Number of Occurrences:   1    Standing Expiration Date:   09/26/2021    All questions were answered. The patient knows to call the clinic with any problems questions or concerns.  Cc McLean-Scocuzza, French Ana *  Return of visit: 2 weeks Thank you for this kind referral and the opportunity to participate in the care of this patient. A copy of today's note is routed to referring provider      Rickard Patience, MD, PhD Hematology Oncology [facility] Pager- [phone] 09/26/2020

## 2020-09-28 LAB — COMP PANEL: LEUKEMIA/LYMPHOMA

## 2020-10-05 LAB — ANTINUCLEAR ANTIBODIES, IFA: ANA Ab, IFA: NEGATIVE

## 2020-10-11 ENCOUNTER — Inpatient Hospital Stay: Payer: BC Managed Care – PPO | Attending: Oncology | Admitting: Oncology

## 2020-10-11 ENCOUNTER — Encounter: Payer: Self-pay | Admitting: Oncology

## 2020-10-11 ENCOUNTER — Other Ambulatory Visit: Payer: Self-pay

## 2020-10-11 DIAGNOSIS — Z79899 Other long term (current) drug therapy: Secondary | ICD-10-CM | POA: Insufficient documentation

## 2020-10-11 DIAGNOSIS — R7989 Other specified abnormal findings of blood chemistry: Secondary | ICD-10-CM | POA: Diagnosis not present

## 2020-10-11 DIAGNOSIS — D72819 Decreased white blood cell count, unspecified: Secondary | ICD-10-CM | POA: Insufficient documentation

## 2020-10-11 NOTE — Progress Notes (Signed)
HEMATOLOGY-ONCOLOGY TeleHEALTH VISIT PROGRESS NOTE  I connected with [name] on 10/11/20  at  2:45 PM EDT by video enabled telemedicine visit and verified that I am speaking with the correct person using two identifiers. I discussed the limitations, risks, security and privacy concerns of performing an evaluation and management service by telemedicine and the availability of in-person appointments. The patient expressed understanding and agreed to proceed.   Other persons participating in the visit and their role in the encounter:  None  Patient's location: Home  Provider's location: office Chief Complaint: leukopenia   INTERVAL HISTORY Susan Allison is a 60 y.o. female who has above history reviewed by me today presents for follow up visit for management of leukopenia Patient had blood work done and presents virtually to review results.   Review of Systems  Constitutional:  Negative for appetite change, chills, fatigue and fever.  HENT:   Negative for hearing loss and voice change.   Eyes:  Negative for eye problems.  Respiratory:  Negative for chest tightness and cough.   Cardiovascular:  Negative for chest pain.  Gastrointestinal:  Negative for abdominal distention, abdominal pain and blood in stool.  Endocrine: Negative for hot flashes.  Genitourinary:  Negative for difficulty urinating and frequency.   Musculoskeletal:  Negative for arthralgias.  Skin:  Negative for itching and rash.  Neurological:  Negative for extremity weakness.  Hematological:  Negative for adenopathy.  Psychiatric/Behavioral:  Negative for confusion.    Past Medical History:  Diagnosis Date   Arthritis    GERD (gastroesophageal reflux disease)    Hypertension    Joint pain    Leukocytosis 09/26/2020   New dx   Mastodynia    Sinus problem    Past Surgical History:  Procedure Laterality Date   COLONOSCOPY  2011, 2021   ESOPHAGOGASTRODUODENOSCOPY N/A 08/14/2014   Procedure:  ESOPHAGOGASTRODUODENOSCOPY (EGD);  Surgeon: [name], MD;  Location: [facility] ENDOSCOPY;  Service: Gastroenterology;  Laterality: N/A;    Family History  Problem Relation Age of Onset   Breast cancer Other        49s, has contact   Hypertension Mother    Liver disease Mother        nash with cirrhosis   Other Mother        brain tumor    Prostate cancer Father 71   Diabetes Brother    Arthritis/Rheumatoid Sister    Lupus Maternal Aunt    Heart attack Maternal Grandmother     Social History   Socioeconomic History   Marital status: Married    Spouse name: Not on file   Number of children: 2   Years of education: Not on file   Highest education level: Bachelor's degree (e.g., BA, AB, BS)  Occupational History   Not on file  Tobacco Use   Smoking status: Never   Smokeless tobacco: Never  Vaping Use   Vaping Use: Never used  Substance and Sexual Activity   Alcohol use: Never   Drug use: Never   Sexual activity: Yes    Birth control/protection: Post-menopausal  Other Topics Concern   Not on file  Social History Narrative   Married.   2 children. 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    Lives at home with spouse & children   Left handed   Caffeine: no   Social Determinants of Health  Financial Resource Strain: Not on file  Food Insecurity: Not on file  Transportation Needs: Not on file  Physical Activity: Not on file  Stress: Not on file  Social Connections: Not on file  Intimate Partner Violence: Not on file    Current Outpatient Medications on File Prior to Visit  Medication Sig Dispense Refill   amLODipine (NORVASC) 2.5 MG tablet Take 1 tablet (2.5 mg total) by mouth daily at 6 PM. 90 tablet 3   azelastine (ASTELIN) 0.1 % nasal spray Place 2 sprays into both nostrils as needed. Use in each nostril as directed 30 mL 1   calcium carbonate (OS-CAL) 600 MG TABS tablet Take 600  mg by mouth daily.      cetirizine (ZYRTEC) 10 MG tablet Take 10 mg by mouth as needed.      cholecalciferol (VITAMIN D) 1000 units tablet Take 1,000 Units by mouth daily.     cyclobenzaprine (FLEXERIL) 5 MG tablet 1/2 to 1 tablet at bedtime, will cause sedation. 15 tablet 0   fluticasone (FLONASE) 50 MCG/ACT nasal spray Place 1-2 sprays into both nostrils daily. 16 g 11   hydrochlorothiazide (HYDRODIURIL) 12.5 MG tablet Take 1 tablet (12.5 mg total) by mouth daily. In the am 90 tablet 3   levothyroxine (SYNTHROID) 50 MCG tablet Take 1 tablet (50 mcg total) by mouth daily before breakfast. 30 min before food in the am 90 tablet 3   No current facility-administered medications on file prior to visit.    Allergies  Allergen Reactions   Augmentin [Amoxicillin-Pot Clavulanate] Itching    Itching that then caused whelts   Lisinopril     Possible cause of itching/ elevated liver enzymes - discontinued 06/03/17.  Cough        Observations/Objective: Today's Vitals   10/11/20 1436  PainSc: 0-No pain   There is no height or weight on file to calculate BMI.  Physical Exam Neurological:     Mental Status: She is alert.    CBC    Component Value Date/Time   WBC 3.5 (L) 09/26/2020 1209   RBC 4.44 09/26/2020 1209   HGB 12.5 09/26/2020 1209   HGB 11.6 06/10/2017 0912   HCT 38.8 09/26/2020 1209   HCT 35.3 06/10/2017 0912   PLT 285 09/26/2020 1209   PLT 309 06/10/2017 0912   MCV 87.4 09/26/2020 1209   MCV 86 06/10/2017 0912   MCH 28.2 09/26/2020 1209   MCHC 32.2 09/26/2020 1209   RDW 13.9 09/26/2020 1209   RDW 16.4 (H) 06/10/2017 0912   LYMPHSABS 1.4 09/26/2020 1209   LYMPHSABS 1.2 06/10/2017 0912   MONOABS 0.4 09/26/2020 1209   EOSABS 0.1 09/26/2020 1209   EOSABS 0.2 06/10/2017 0912   BASOSABS 0.0 09/26/2020 1209   BASOSABS 0.0 06/10/2017 0912    CMP     Component Value Date/Time   NA 135 09/26/2020 1209   NA 143 04/22/2018 1350   K 3.6 09/26/2020 1209   CL 100 09/26/2020  1209   CO2 28 09/26/2020 1209   GLUCOSE 83 09/26/2020 1209   BUN 11 09/26/2020 1209   BUN 14 04/22/2018 1350   CREATININE 1.08 (H) 09/26/2020 1209   CALCIUM 9.0 09/26/2020 1209   PROT 7.8 09/26/2020 1209   PROT 7.3 04/22/2018 1350   ALBUMIN 4.4 09/26/2020 1209   ALBUMIN 4.8 04/22/2018 1350   AST 18 09/26/2020 1209   ALT 14 09/26/2020 1209   ALKPHOS 106 09/26/2020 1209   BILITOT 0.3 09/26/2020 1209  BILITOT <0.2 04/22/2018 1350   GFRNONAA 58 (L) 09/26/2020 1209   GFRAA 61 04/22/2018 1350     Assessment and Plan: 1. Leukopenia, unspecified type   2. Elevated serum creatinine     # Leukopenia, predominantly mild neutropenia.  Vitamin B12 is at low normal end.  Recommend her to start OTC oral vitamin B12 daily.  Negative peripheral blood flowcytometry, normal Folate, normal LDH, negative ANA, negative HIV.  Possibly due to low B12 or ethnic neutropenia.  She is asymptomatic, no consitutional symptoms.  Continue observation.   Elevated creatine: Cr 1.08, recommend patient to increase oral hydration and follow up with PCP. Check SPEP at next visit Follow Up Instructions: Follow up in 6 months, lab MD   I discussed the assessment and treatment plan with the patient. The patient was provided an opportunity to ask questions and all were answered. The patient agreed with the plan and demonstrated an understanding of the instructions.  The patient was advised to call back or seek an in-person evaluation if the symptoms worsen or if the condition fails to improve as anticipated.   Rickard Patience, MD 10/11/2020 9:29 PM

## 2020-10-15 DIAGNOSIS — H6982 Other specified disorders of Eustachian tube, left ear: Secondary | ICD-10-CM | POA: Diagnosis not present

## 2020-10-15 DIAGNOSIS — H9202 Otalgia, left ear: Secondary | ICD-10-CM | POA: Diagnosis not present

## 2020-12-18 ENCOUNTER — Other Ambulatory Visit: Payer: Self-pay

## 2020-12-18 ENCOUNTER — Encounter: Payer: Self-pay | Admitting: Internal Medicine

## 2020-12-18 ENCOUNTER — Ambulatory Visit (INDEPENDENT_AMBULATORY_CARE_PROVIDER_SITE_OTHER): Payer: BC Managed Care – PPO | Admitting: Internal Medicine

## 2020-12-18 VITALS — BP 126/74 | HR 76 | Temp 96.8°F | Ht 60.0 in | Wt 124.2 lb

## 2020-12-18 DIAGNOSIS — R7303 Prediabetes: Secondary | ICD-10-CM

## 2020-12-18 DIAGNOSIS — E039 Hypothyroidism, unspecified: Secondary | ICD-10-CM | POA: Diagnosis not present

## 2020-12-18 DIAGNOSIS — I10 Essential (primary) hypertension: Secondary | ICD-10-CM

## 2020-12-18 DIAGNOSIS — Z1389 Encounter for screening for other disorder: Secondary | ICD-10-CM

## 2020-12-18 DIAGNOSIS — E785 Hyperlipidemia, unspecified: Secondary | ICD-10-CM

## 2020-12-18 DIAGNOSIS — D72819 Decreased white blood cell count, unspecified: Secondary | ICD-10-CM

## 2020-12-18 DIAGNOSIS — R0602 Shortness of breath: Secondary | ICD-10-CM

## 2020-12-18 DIAGNOSIS — Z23 Encounter for immunization: Secondary | ICD-10-CM

## 2020-12-18 LAB — LIPID PANEL
Cholesterol: 174 mg/dL (ref 0–200)
HDL: 53.2 mg/dL (ref >39.00)
LDL Cholesterol: 105 mg/dL — ABNORMAL HIGH (ref 0–99)
NonHDL: 120.46
Total CHOL/HDL Ratio: 3
Triglycerides: 79 mg/dL (ref 0.0–149.0)
VLDL: 15.8 mg/dL (ref 0.0–40.0)

## 2020-12-18 LAB — BASIC METABOLIC PANEL
BUN: 11 mg/dL (ref 6–23)
CO2: 29 mEq/L (ref 19–32)
Calcium: 9.5 mg/dL (ref 8.4–10.5)
Chloride: 104 mEq/L (ref 96–112)
Creatinine, Ser: 0.99 mg/dL (ref 0.40–1.20)
GFR: 61.32 mL/min (ref >60.00)
Glucose, Bld: 88 mg/dL (ref 70–99)
Potassium: 4.5 mEq/L (ref 3.5–5.1)
Sodium: 140 mEq/L (ref 135–145)

## 2020-12-18 LAB — TSH: TSH: 1.3 u[IU]/mL (ref 0.35–5.50)

## 2020-12-18 LAB — HEMOGLOBIN A1C: Hgb A1c MFr Bld: 5.9 % (ref 4.6–6.5)

## 2020-12-18 MED ORDER — AMLODIPINE BESYLATE 5 MG PO TABS: 5.0000 mg | ORAL_TABLET | Freq: Every day | ORAL | 3 refills | Status: DC

## 2020-12-18 NOTE — Progress Notes (Signed)
+  prediabetes  Cholesterol improved continue good work goal LDL <100  Kidney function improved  Monitor BP with change in medications  Thyroid lab normal continue medication  Urine pending

## 2020-12-18 NOTE — Progress Notes (Signed)
Chief Complaint  Patient presents with   Follow-up   F/u  1. Flu shot given today  2. Htn controlled on hctz 12.5 mg qd norvasc 2.5 mg qd with elevated creatinine will hold hctz 3. Hypothyroidism on 50 mcg qd   Review of Systems  Constitutional:  Negative for weight loss.  HENT:  Negative for hearing loss.   Eyes:  Negative for blurred vision.  Respiratory:  Negative for shortness of breath.   Cardiovascular:  Negative for chest pain.  Gastrointestinal:  Negative for abdominal pain and blood in stool.  Genitourinary:  Negative for dysuria.  Musculoskeletal:  Negative for falls and joint pain.  Skin:  Negative for rash.  Neurological:  Negative for headaches.  Psychiatric/Behavioral:  Negative for depression.   Past Medical History:  Diagnosis Date   Arthritis    COVID-19    10/2020   GERD (gastroesophageal reflux disease)    Hypertension    Joint pain    Leukocytosis 09/26/2020   New dx   Mastodynia    Sinus problem    Past Surgical History:  Procedure Laterality Date   COLONOSCOPY  2011, 2021   ESOPHAGOGASTRODUODENOSCOPY N/A 08/14/2014   Procedure: ESOPHAGOGASTRODUODENOSCOPY (EGD);  Surgeon: Hulen Luster, MD;  Location: [facility] ENDOSCOPY;  Service: Gastroenterology;  Laterality: N/A;   Family History  Problem Relation Age of Onset   Hypertension Mother    Liver disease Mother        nash with cirrhosis   Other Mother        brain tumor    Prostate cancer Father 72   Arthritis/Rheumatoid Sister    Diabetes Brother    Heart attack Maternal Grandmother    Lupus Maternal Aunt    Breast cancer Other        82s, has contact   Social History   Socioeconomic History   Marital status: Married    Spouse name: Not on file   Number of children: 2   Years of education: Not on file   Highest education level: Bachelor's degree (e.g., BA, AB, BS)  Occupational History   Not on file  Tobacco Use   Smoking status: Never   Smokeless tobacco: Never  Vaping Use   Vaping Use:  Never used  Substance and Sexual Activity   Alcohol use: Never   Drug use: Never   Sexual activity: Yes    Birth control/protection: Post-menopausal  Other Topics Concern   Not on file  Social History Narrative   Married.   2 children. 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    Lives at home with spouse & children   Left handed   Caffeine: no   Social Determinants of Health   Financial Resource Strain: Not on file  Food Insecurity: Not on file  Transportation Needs: Not on file  Physical Activity: Not on file  Stress: Not on file  Social Connections: Not on file  Intimate Partner Violence: Not on file   Current Meds  Medication Sig   azelastine (ASTELIN) 0.1 % nasal spray Place 2 sprays into both nostrils as needed. Use in each nostril as directed   calcium carbonate (OS-CAL) 600 MG TABS tablet Take 600 mg by mouth daily.    cetirizine (ZYRTEC) 10 MG tablet Take 10 mg by mouth as needed.    cholecalciferol (VITAMIN D) 1000 units tablet Take 1,000 Units by mouth daily.  Cyanocobalamin (VITAMIN B-12 PO) Take by mouth daily at 12 noon.   fluticasone (FLONASE) 50 MCG/ACT nasal spray Place 1-2 sprays into both nostrils daily.   levothyroxine (SYNTHROID) 50 MCG tablet Take 1 tablet (50 mcg total) by mouth daily before breakfast. 30 min before food in the am   [DISCONTINUED] amLODipine (NORVASC) 2.5 MG tablet Take 1 tablet (2.5 mg total) by mouth daily at 6 PM.   [DISCONTINUED] hydrochlorothiazide (HYDRODIURIL) 12.5 MG tablet Take 1 tablet (12.5 mg total) by mouth daily. In the am   Allergies  Allergen Reactions   Augmentin [Amoxicillin-Pot Clavulanate] Itching    Itching that then caused whelts   Lisinopril     Possible cause of itching/ elevated liver enzymes - discontinued 06/03/17.  Cough    Recent Results (from the past 2160 hour(s))  HIV Antibody (routine testing w rflx)     Status:  None   Collection Time: 09/26/20 12:09 PM  Result Value Ref Range   HIV Screen 4th Generation wRfx Non Reactive Non Reactive    Comment: Performed at [facility], [address] [place], [place] [address]  ANA, IFA (with reflex)     Status: None   Collection Time: 09/26/20 12:09 PM  Result Value Ref Range   ANA Ab, IFA Negative     Comment: (NOTE)                                     Negative   <1:80                                     Borderline  1:80                                     Positive   >1:80 ICAP nomenclature: AC-0 For more information about Hep-2 cell patterns use ANApatterns.org, the official website for the International Consensus on Antinuclear Antibody (ANA) Patterns (ICAP). Performed At: [facility] [place], [place] [id number] Rush Farmer MD [id number]   Flow cytometry panel-leukemia/lymphoma work-up     Status: None   Collection Time: 09/26/20 12:09 PM  Result Value Ref Range   PATH INTERP XXX-IMP Comment     Comment: No significant immunophenotypic abnormality detected   ANNOTATION COMMENT IMP Comment     Comment: Recommend clinical correlation and follow up as appropriate.   CLINICAL INFO Comment     Comment: (NOTE) Accompanying CBC dated 09/26/2020 shows: WBC count 3.5, Neu 1.5, Lym 1.4, Mon 0.4.    Specimen Type Comment     Comment: Peripheral blood   ASSESSMENT OF LEUKOCYTES Comment     Comment: (NOTE) No monoclonal B cell population is detected. kappa:lambda ratio 1.8 There is no loss of, or aberrant expression of, the pan T cell antigens to suggest a neoplastic T cell process. CD4:CD8 ratio 1.1 No circulating blasts are detected. There is no immunophenotypic evidence of abnormal myeloid maturation. Rare monocytes show partial dim aberrant expression of CD56, a nonspecific finding that can be seen in association with both reactive/activated processes as well as neoplastic processes. Analysis of the  leukocyte population shows: granulocytes 51%, monocytes 5%, lymphocytes 44%, blasts <0.1%, B cells 5%, T cells 36%, NK cells 3%.    %  Viable Cells Comment     Comment: 95%   ANALYSIS AND GATING STRATEGY Comment     Comment: 8 color analysis with CD45/SSC gating   IMMUNOPHENOTYPING STUDY Comment     Comment: (NOTE) CD2       Normal         CD3       Normal CD4       Normal         CD5       Normal CD7       Normal         CD8       Normal CD10      Normal         CD11b     Normal CD13      Normal         CD14      Normal CD16      Normal         CD19      Normal CD20      Normal         CD33      Normal CD34      Normal         CD38      Normal CD45      Normal         CD56      See Text CD57      Normal         CD117     Normal HLA-DR    Normal         KAPPA     Normal LAMBDA    Normal         CD64      Normal    PATHOLOGIST NAME Comment     Comment: Cecilie Kicks, M. D.   COMMENT: Comment     Comment: (NOTE) Each antibody in this assay was utilized to assess for potential abnormalities of studied cell populations or to characterize identified abnormalities. This test was developed and its performance characteristics determined by [facility].  It has not been cleared or approved by the U.S. Food and Drug Administration. The FDA has determined that such clearance or approval is not necessary. This test is used for clinical purposes.  It should not be regarded as investigational or for research. Performed At: [facility] [address] [place], [place] [id number] Katina Degree MDPhD [id number] Performed At: [facility] [address] [place], [place] [id number] Katina Degree MDPhD [id number]   Lactate dehydrogenase     Status: None   Collection Time: 09/26/20 12:09 PM  Result Value Ref Range   LDH 108 98 - 192 U/L    Comment: Performed at [facility], [address] [place], [place] [address]  Comprehensive metabolic panel     Status: Abnormal    Collection Time: 09/26/20 12:09 PM  Result Value Ref Range   Sodium 135 135 - 145 mmol/L   Potassium 3.6 3.5 - 5.1 mmol/L   Chloride 100 98 - 111 mmol/L   CO2 28 22 - 32 mmol/L   Glucose, Bld 83 70 - 99 mg/dL    Comment: Glucose reference range applies only to samples taken after fasting for at least 8 hours.   BUN 11 8 - 23 mg/dL   Creatinine, Ser 1.08 (H) 0.44 - 1.00 mg/dL   Calcium 9.0 8.9 - 10.3 mg/dL   Total Protein 7.8 6.5 -  8.1 g/dL   Albumin 4.4 3.5 - 5.0 g/dL   AST 18 15 - 41 U/L   ALT 14 0 - 44 U/L   Alkaline Phosphatase 106 38 - 126 U/L   Total Bilirubin 0.3 0.3 - 1.2 mg/dL   GFR, Estimated 58 (L) >60 mL/min    Comment: (NOTE) Calculated using the CKD-EPI Creatinine Equation (2021)    Anion gap 7 5 - 15    Comment: Performed at [facility], [address] [place], [place] [address]  CBC with Differential/Platelet     Status: Abnormal   Collection Time: 09/26/20 12:09 PM  Result Value Ref Range   WBC 3.5 (L) 4.0 - 10.5 K/uL   RBC 4.44 3.87 - 5.11 MIL/uL   Hemoglobin 12.5 12.0 - 15.0 g/dL   HCT 38.8 36.0 - 46.0 %   MCV 87.4 80.0 - 100.0 fL   MCH 28.2 26.0 - 34.0 pg   MCHC 32.2 30.0 - 36.0 g/dL   RDW 13.9 11.5 - 15.5 %   Platelets 285 150 - 400 K/uL   nRBC 0.0 0.0 - 0.2 %   Neutrophils Relative % 44 %   Neutro Abs 1.5 (L) 1.7 - 7.7 K/uL   Lymphocytes Relative 42 %   Lymphs Abs 1.4 0.7 - 4.0 K/uL   Monocytes Relative 11 %   Monocytes Absolute 0.4 0.1 - 1.0 K/uL   Eosinophils Relative 2 %   Eosinophils Absolute 0.1 0.0 - 0.5 K/uL   Basophils Relative 1 %   Basophils Absolute 0.0 0.0 - 0.1 K/uL   Immature Granulocytes 0 %   Abs Immature Granulocytes 0.01 0.00 - 0.07 K/uL    Comment: Performed at [facility], [address] [place], [place] [address]  Folate     Status: None   Collection Time: 09/26/20 12:09 PM  Result Value Ref Range   Folate 17.9 >5.9 ng/mL    Comment: Performed at [facility], [address] [place],  [place] [address]  Vitamin B12     Status: None   Collection Time: 09/26/20 12:09 PM  Result Value Ref Range   Vitamin B-12 244 180 - 914 pg/mL    Comment: (NOTE) This assay is not validated for testing neonatal or myeloproliferative syndrome specimens for Vitamin B12 levels. Performed at [facility], [place] [address] [place], [place] [address]   Technologist smear review     Status: None   Collection Time: 09/26/20 12:11 PM  Result Value Ref Range   WBC MORPHOLOGY Normal RBC, WBC, and platelet    RBC MORPHOLOGY Normal RBC, WBC, and platelet    Tech Review Normal RBC, WBC, and platelet     Comment: Performed at [facility], [address] [place],  [address]   Objective  Body mass index is 24.26 kg/m. Wt Readings from Last 3 Encounters:  12/18/20 124 lb 3.2 oz (56.3 kg)  09/26/20 126 lb (57.2 kg)  05/16/20 128 lb (58.1 kg)   Temp Readings from Last 3 Encounters:  12/18/20 (!) 96.8 F (36 C) (Temporal)  09/26/20 98.2 F (36.8 C) (Tympanic)  05/16/20 98.1 F (36.7 C) (Oral)   BP Readings from Last 3 Encounters:  12/18/20 126/74  09/26/20 (!) 143/74  05/16/20 120/80   Pulse Readings from Last 3 Encounters:  12/18/20 76  09/26/20 89  05/16/20 81    Physical Exam Vitals and nursing note reviewed.  Constitutional:      Appearance: Normal appearance. She is  well-developed and well-groomed.  HENT:     Head: Normocephalic and atraumatic.  Eyes:     Conjunctiva/sclera: Conjunctivae normal.     Pupils: Pupils are equal, round, and reactive to light.  Cardiovascular:     Rate and Rhythm: Normal rate and regular rhythm.     Heart sounds: Normal heart sounds. No murmur heard. Pulmonary:     Effort: Pulmonary effort is normal.     Breath sounds: Normal breath sounds.  Abdominal:     General: Abdomen is flat. Bowel sounds are normal.     Tenderness: There is no abdominal tenderness.  Musculoskeletal:        General: No tenderness.  Skin:    General:  Skin is warm and dry.  Neurological:     General: No focal deficit present.     Mental Status: She is alert and oriented to person, place, and time. Mental status is at baseline.     Cranial Nerves: Cranial nerves 2-12 are intact.     Gait: Gait is intact.  Psychiatric:        Attention and Perception: Attention and perception normal.        Mood and Affect: Mood and affect normal.        Speech: Speech normal.        Behavior: Behavior normal. Behavior is cooperative.        Thought Content: Thought content normal.        Cognition and Memory: Cognition and memory normal.        Judgment: Judgment normal.    Assessment  Plan  Essential hypertension controlled- Plan: amLODipine (NORVASC) 5 MG tablet increase from 2.5 mg  Stop hctz 12.5 mg qd  Lipid panel, Basic Metabolic Panel (BMET)   SOB (shortness of breath) Thinks had covid 10/22 since had sob not bad  Advised call back cxr if needed   Prediabetes - Plan: Hemoglobin A1c  Hypothyroidism, unspecified type - Plan: TSH On levo 50 mcg qd   Hyperlipidemia, unspecified hyperlipidemia type Check lipid today  Leukopenia f/u h/o 04/09/21 labs and f/u will check spep likely benign  HM-CPE at f/u  utd flu shot  Tdap utd Disc shingrix today again pt declines 12/18/20 covid 3/3 pfizer disc booster thinking about it as of 12/18/20   Hep C negative  Declines HIV    Pap 03/25/2018 neg pap neg HPV [facility]   Mammo 08/09/20 negative    04/17/09 colonoscopy  -normal f/u in 10 years -had 02/11/19 diver/ih repeat 10 years no FH    DEXA 03/13/10 normal do another age 50    rec healthy diet and exercise    Provider: Dr. Olivia Mackie McLean-Scocuzza-Internal Medicine

## 2020-12-18 NOTE — Patient Instructions (Addendum)
Consider 4th pfizer dose  Call and consider pap smear 03/25/21 last pap 03/25/2018 now paps every 3-5 years   Manuka honey  Mucinex DM green label or robitussin dm  If cough continues consider cxr   Shortness of Breath, Adult Shortness of breath is when a person has trouble breathing or when a person feels like she or he is having trouble breathing in enough air. Shortness of breath could be a sign of a medical problem. Follow these instructions at home: Pollutants Do not use any products that contain nicotine or tobacco. These products include cigarettes, chewing tobacco, and vaping devices, such as e-cigarettes. This also includes cigars and pipes. If you need help quitting, ask your health care provider. Avoid things that can irritate your airways, including: Smoke. This includes campfire smoke, forest fire smoke, and secondhand smoke from tobacco products. Do not smoke or allow others to smoke in your home. Mold. Dust. Air pollution. Chemical fumes. Things that can give you an allergic reaction (allergens) if you have allergies. Common allergens include pollen from grasses or trees and animal dander. Keep your living space clean and free of mold and dust. General instructions Pay attention to any changes in your symptoms. Take over-the-counter and prescription medicines only as told by your health care provider. This includes oxygen therapy and inhaled medicines. Rest as needed. Return to your normal activities as told by your health care provider. Ask your health care provider what activities are safe for you. Keep all follow-up visits. This is important. Contact a health care provider if: Your condition does not improve as soon as expected. You have a hard time doing your normal activities, even after you rest. You have new symptoms. You cannot walk up stairs or exercise the way that you normally do. Get help right away if: Your shortness of breath gets worse. You have shortness  of breath when you are resting. You feel light-headed or you faint. You have a cough that is not controlled with medicines. You cough up blood. You have pain with breathing. You have pain in your chest, arms, shoulders, or abdomen. You have a fever. These symptoms may be an emergency. Get help right away. Call 911. Do not wait to see if the symptoms will go away. Do not drive yourself to the hospital. Summary Shortness of breath is when a person has trouble breathing enough air. It can be a sign of a medical problem. Avoid things that irritate your lungs, such as smoking, pollution, mold, and dust. Pay attention to changes in your symptoms and contact your health care provider if you have a hard time completing daily activities because of shortness of breath. This information is not intended to replace advice given to you by your health care provider. Make sure you discuss any questions you have with your health care provider. Document Revised: 09/01/2020 Document Reviewed: 09/01/2020 Elsevier Patient Education  2022 [employer].

## 2020-12-19 ENCOUNTER — Telehealth: Payer: Self-pay

## 2020-12-19 LAB — URINALYSIS, ROUTINE W REFLEX MICROSCOPIC
Bilirubin Urine: NEGATIVE
Glucose, UA: NEGATIVE
Hgb urine dipstick: NEGATIVE
Ketones, ur: NEGATIVE
Leukocytes,Ua: NEGATIVE
Nitrite: NEGATIVE
Protein, ur: NEGATIVE
Specific Gravity, Urine: 1.017 (ref 1.001–1.035)
pH: >=8.5 — AB (ref 5.0–8.0)

## 2020-12-19 NOTE — Telephone Encounter (Signed)
LMTCB in regards to lab results.  

## 2021-02-18 ENCOUNTER — Telehealth: Payer: Self-pay

## 2021-02-18 NOTE — Telephone Encounter (Signed)
Patient called today and wanted me to go over her labs form November and I did that and she understood.  Justo Hengel,cma

## 2021-02-26 ENCOUNTER — Other Ambulatory Visit: Payer: Self-pay | Admitting: Internal Medicine

## 2021-02-26 DIAGNOSIS — E039 Hypothyroidism, unspecified: Secondary | ICD-10-CM

## 2021-03-27 DIAGNOSIS — R7309 Other abnormal glucose: Secondary | ICD-10-CM | POA: Diagnosis not present

## 2021-04-09 ENCOUNTER — Other Ambulatory Visit: Payer: BC Managed Care – PPO

## 2021-04-10 ENCOUNTER — Ambulatory Visit: Payer: BC Managed Care – PPO | Admitting: Oncology

## 2021-04-22 ENCOUNTER — Other Ambulatory Visit: Payer: Self-pay | Admitting: Obstetrics and Gynecology

## 2021-04-22 ENCOUNTER — Inpatient Hospital Stay (HOSPITAL_BASED_OUTPATIENT_CLINIC_OR_DEPARTMENT_OTHER): Payer: BC Managed Care – PPO | Admitting: Oncology

## 2021-04-22 ENCOUNTER — Inpatient Hospital Stay: Payer: BC Managed Care – PPO | Attending: Oncology

## 2021-04-22 ENCOUNTER — Encounter: Payer: Self-pay | Admitting: Oncology

## 2021-04-22 ENCOUNTER — Other Ambulatory Visit: Payer: Self-pay

## 2021-04-22 VITALS — BP 151/70 | HR 64 | Temp 96.8°F | Resp 18 | Wt 125.5 lb

## 2021-04-22 DIAGNOSIS — D709 Neutropenia, unspecified: Secondary | ICD-10-CM | POA: Insufficient documentation

## 2021-04-22 DIAGNOSIS — D72819 Decreased white blood cell count, unspecified: Secondary | ICD-10-CM

## 2021-04-22 DIAGNOSIS — Z803 Family history of malignant neoplasm of breast: Secondary | ICD-10-CM | POA: Diagnosis not present

## 2021-04-22 DIAGNOSIS — E538 Deficiency of other specified B group vitamins: Secondary | ICD-10-CM

## 2021-04-22 DIAGNOSIS — Z1231 Encounter for screening mammogram for malignant neoplasm of breast: Secondary | ICD-10-CM

## 2021-04-22 LAB — COMPREHENSIVE METABOLIC PANEL
ALT: 16 U/L (ref 0–44)
AST: 20 U/L (ref 15–41)
Albumin: 4.6 g/dL (ref 3.5–5.0)
Alkaline Phosphatase: 111 U/L (ref 38–126)
Anion gap: 6 (ref 5–15)
BUN: 9 mg/dL (ref 8–23)
CO2: 26 mmol/L (ref 22–32)
Calcium: 9 mg/dL (ref 8.9–10.3)
Chloride: 103 mmol/L (ref 98–111)
Creatinine, Ser: 0.93 mg/dL (ref 0.44–1.00)
GFR, Estimated: >60 mL/min (ref >60)
Glucose, Bld: 102 mg/dL — ABNORMAL HIGH (ref 70–99)
Potassium: 3.8 mmol/L (ref 3.5–5.1)
Sodium: 135 mmol/L (ref 135–145)
Total Bilirubin: 0.1 mg/dL — ABNORMAL LOW (ref 0.3–1.2)
Total Protein: 8.1 g/dL (ref 6.5–8.1)

## 2021-04-22 LAB — CBC WITH DIFFERENTIAL/PLATELET
Abs Immature Granulocytes: 0.01 10*3/uL (ref 0.00–0.07)
Basophils Absolute: 0 10*3/uL (ref 0.0–0.1)
Basophils Relative: 1 %
Eosinophils Absolute: 0 10*3/uL (ref 0.0–0.5)
Eosinophils Relative: 1 %
HCT: 40.3 % (ref 36.0–46.0)
Hemoglobin: 13.1 g/dL (ref 12.0–15.0)
Immature Granulocytes: 0 %
Lymphocytes Relative: 26 %
Lymphs Abs: 0.9 10*3/uL (ref 0.7–4.0)
MCH: 28 pg (ref 26.0–34.0)
MCHC: 32.5 g/dL (ref 30.0–36.0)
MCV: 86.1 fL (ref 80.0–100.0)
Monocytes Absolute: 0.3 10*3/uL (ref 0.1–1.0)
Monocytes Relative: 8 %
Neutro Abs: 2.3 10*3/uL (ref 1.7–7.7)
Neutrophils Relative %: 64 %
Platelets: 267 10*3/uL (ref 150–400)
RBC: 4.68 MIL/uL (ref 3.87–5.11)
RDW: 13.9 % (ref 11.5–15.5)
WBC: 3.5 10*3/uL — ABNORMAL LOW (ref 4.0–10.5)
nRBC: 0 % (ref 0.0–0.2)

## 2021-04-22 LAB — VITAMIN B12: Vitamin B-12: 673 pg/mL (ref 180–914)

## 2021-04-22 NOTE — Progress Notes (Signed)
Pt here for follow up. No new concerns voiced.   

## 2021-04-22 NOTE — Progress Notes (Addendum)
?Hematology/Oncology Progress note ?[phone] [phone] ?  ? ? ? ?Patient Care Team: ?McLean-Scocuzza, Pasty Spillers, MD as PCP - General (Internal Medicine) ? ?REFERRING PROVIDER: ?McLean-Scocuzza, French Ana * ? ?CHIEF COMPLAINTS/REASON FOR VISIT:  ?Evaluation of leukopenia ? ?HISTORY OF PRESENTING ILLNESS:  ?Susan Allison is a 61 y.o. female who was seen in consultation at the request of McLean-Scocuzza, French Ana * for evaluation of leukopenia. ?Reviewed patient's recent labs.  ?05/16/2020 patient has low total WBC count was 2.9, normal differential. ?Previous lab records reviewed.  04/02/2020, patient had a slight leukopenia of 3.3, mild neutropenia with  ?ANC 1.3.  No aggravating or improving factors. ? ?Associated symptoms:  denies fatigue, weight loss, fever, chills, frequent infection.  ?History hepatitis or HIV infection: Denies ?History of chronic liver disease: Denies ?History of blood transfusion: Denies ?Alcohol consumption: Denies ?Diet Vegetarian or Vegan: Denies ?Herbal medication: Denies ? ?INTERVAL HISTORY ?Susan Allison is a 61 y.o. female who has above history reviewed by me today presents for follow up visit for management of leukopenia and low B12 level. ?Patient takes oral vitamin B12 supplements.  Today she has no new complaints. ? ?  ? ? ?Review of Systems  ?Constitutional:  Negative for appetite change, chills, fatigue and fever.  ?HENT:   Negative for hearing loss and voice change.   ?Eyes:  Negative for eye problems.  ?Respiratory:  Negative for chest tightness and cough.   ?Cardiovascular:  Negative for chest pain.  ?Gastrointestinal:  Negative for abdominal distention, abdominal pain and blood in stool.  ?Endocrine: Negative for hot flashes.  ?Genitourinary:  Negative for difficulty urinating and frequency.   ?Musculoskeletal:  Negative for arthralgias.  ?Skin:  Negative for itching and rash.  ?Neurological:  Negative for extremity weakness.  ?Hematological:  Negative for  adenopathy.  ?Psychiatric/Behavioral:  Negative for confusion.   ? ?MEDICAL HISTORY:  ?Past Medical History:  ?Diagnosis Date  ? Arthritis   ? COVID-19   ? 10/2020  ? GERD (gastroesophageal reflux disease)   ? Hypertension   ? Joint pain   ? Leukocytosis 09/26/2020  ? New dx  ? Mastodynia   ? Sinus problem   ? ? ?SURGICAL HISTORY: ?Past Surgical History:  ?Procedure Laterality Date  ? COLONOSCOPY  2011, 2021  ? ESOPHAGOGASTRODUODENOSCOPY N/A 08/14/2014  ? Procedure: ESOPHAGOGASTRODUODENOSCOPY (EGD);  Surgeon: [name], MD;  Location: [facility] ENDOSCOPY;  Service: Gastroenterology;  Laterality: N/A;  ? ? ?SOCIAL HISTORY: ?Social History  ? ?Socioeconomic History  ? Marital status: Married  ?  Spouse name: Not on file  ? Number of children: 2  ? Years of education: Not on file  ? Highest education level: Bachelor's degree (e.g., BA, AB, BS)  ?Occupational History  ? Not on file  ?Tobacco Use  ? Smoking status: Never  ? Smokeless tobacco: Never  ?Vaping Use  ? Vaping Use: Never used  ?Substance and Sexual Activity  ? Alcohol use: Never  ? Drug use: Never  ? Sexual activity: Yes  ?  Birth control/protection: Post-menopausal  ?Other Topics Concern  ? Not on file  ?Social History Narrative  ? Married.  ? 2 children. (sons)   ? 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 Enjoys gardening, Museum/gallery conservator, baking, movies.   ? Lives at home with spouse & children  ? Left handed  ? Caffeine: no  ? ?Social Determinants of Health  ? ?Financial Resource Strain: Not on file  ?Food Insecurity: Not  on file  ?Transportation Needs: Not on file  ?Physical Activity: Not on file  ?Stress: Not on file  ?Social Connections: Not on file  ?Intimate Partner Violence: Not on file  ? ? ?FAMILY HISTORY: ?Family History  ?Problem Relation Age of Onset  ? Hypertension Mother   ? Liver disease Mother   ?     nash with cirrhosis  ? Other Mother   ?     brain tumor   ? Prostate cancer Father [id number]  ? Arthritis/Rheumatoid Sister   ?  Diabetes Brother   ? Heart attack Maternal Grandmother   ? Lupus Maternal Aunt   ? Breast cancer Other   ?     5240s, has contact  ? ? ?ALLERGIES:  is allergic to augmentin [amoxicillin-pot clavulanate] and lisinopril. ? ?MEDICATIONS:  ?Current Outpatient Medications  ?Medication Sig Dispense Refill  ? amLODipine (NORVASC) 5 MG tablet Take 1 tablet (5 mg total) by mouth daily at 6 PM. D/c 2.5 90 tablet 3  ? azelastine (ASTELIN) 0.1 % nasal spray Place 2 sprays into both nostrils as needed. Use in each nostril as directed 30 mL 1  ? calcium carbonate (OS-CAL) 600 MG TABS tablet Take 600 mg by mouth daily.     ? cetirizine (ZYRTEC) 10 MG tablet Take 10 mg by mouth as needed.     ? cholecalciferol (VITAMIN D) 1000 units tablet Take 1,000 Units by mouth once a week.    ? Cyanocobalamin (VITAMIN B-12 PO) Take by mouth daily at 12 noon.    ? fluticasone (FLONASE) 50 MCG/ACT nasal spray Place 1-2 sprays into both nostrils daily. 16 g 11  ? levothyroxine (SYNTHROID) 50 MCG tablet TAKE ONE TABLET BY MOUTH EVERY MORNING 30 MINUTES BEFORE BREAKFAST 90 tablet 3  ? ?No current facility-administered medications for this visit.  ? ? ? ?PHYSICAL EXAMINATION: ?ECOG PERFORMANCE STATUS: 0 - Asymptomatic ?Vitals:  ? 04/22/21 1014  ?BP: (!) 151/70  ?Pulse: 64  ?Resp: 18  ?Temp: (!) 96.8 ?F (36 ?C)  ? ?Filed Weights  ? 04/22/21 1014  ?Weight: 125 lb 8 oz (56.9 kg)  ? ? ?Physical Exam ?Constitutional:   ?   General: She is not in acute distress. ?HENT:  ?   Head: Normocephalic and atraumatic.  ?Eyes:  ?   General: No scleral icterus. ?Cardiovascular:  ?   Rate and Rhythm: Normal rate and regular rhythm.  ?   Heart sounds: Normal heart sounds.  ?Pulmonary:  ?   Effort: Pulmonary effort is normal. No respiratory distress.  ?   Breath sounds: No wheezing.  ?Abdominal:  ?   General: Bowel sounds are normal. There is no distension.  ?   Palpations: Abdomen is soft.  ?Musculoskeletal:     ?   General: No deformity. Normal range of motion.  ?    Cervical back: Normal range of motion and neck supple.  ?Skin: ?   General: Skin is warm and dry.  ?   Findings: No erythema or rash.  ?Neurological:  ?   Mental Status: She is alert and oriented to person, place, and time. Mental status is at baseline.  ?   Cranial Nerves: No cranial nerve deficit.  ?   Coordination: Coordination normal.  ?Psychiatric:     ?   Mood and Affect: Mood normal.  ? ? ?RADIOGRAPHIC STUDIES: ?I have personally reviewed the radiological images as listed and agreed with the findings in the report.  ?No results found. ? ? ? ?  LABORATORY DATA:  ?I have reviewed the data as listed ?Lab Results  ?Component Value Date  ? WBC 3.5 (L) 04/22/2021  ? HGB 13.1 04/22/2021  ? HCT 40.3 04/22/2021  ? MCV 86.1 04/22/2021  ? PLT 267 04/22/2021  ? ?Recent Labs  ?  05/16/20 ?0240 09/26/20 ?1209 12/18/20 ?9735 04/22/21 ?1003  ?NA 141 135 140 135  ?K 3.8 3.6 4.5 3.8  ?CL 105 100 104 103  ?CO2 28 28 29 26   ?GLUCOSE 89 83 88 102*  ?BUN 11 11 11 9   ?CREATININE 0.98 1.08* 0.99 0.93  ?CALCIUM 9.5 9.0 9.5 9.0  ?GFRNONAA  --  37*  --  >60  ?PROT 7.0 7.8  --  8.1  ?ALBUMIN 4.2 4.4  --  4.6  ?AST 16 18  --  20  ?ALT 12 14  --  16  ?ALKPHOS 97 106  --  111  ?BILITOT 0.5 0.3  --  0.1*  ? ? ?Iron/TIBC/Ferritin/ %Sat ?No results found for: IRON, TIBC, FERRITIN, IRONPCTSAT  ? ?RADIOGRAPHIC STUDIES: ?I have personally reviewed the radiological images as listed and agreed with the findings in the report. ?No results found.  ? ? ?ASSESSMENT & PLAN:  ?1. Leukopenia, unspecified type   ?2. Low vitamin B12 level   ? ?#Chronic leukopenia, predominantly neutropenia ?Labs reviewed and discussed with patient.  Patient has slightly decreased total white count, normal differential.  Including normal ANC.  This is likely due to ethnic neutropenia or due to previously decreased vitamin B12 level ?Currently B12 level is still pending ?Patient has no constitutional symptoms. ?I recommend observation. ? ?Vitamin B12 level is currently still  pending at the time of dictation.  I recommend patient to continue vitamin B12 supplement empirically a few times per week, unless B12 level is extremely elevated.  She agrees with the ? ?Patient had a slightly

## 2021-04-24 LAB — PROTEIN ELECTROPHORESIS, SERUM
A/G Ratio: 1.3 (ref 0.7–1.7)
Albumin ELP: 4.1 g/dL (ref 2.9–4.4)
Alpha-1-Globulin: 0.2 g/dL (ref 0.0–0.4)
Alpha-2-Globulin: 0.8 g/dL (ref 0.4–1.0)
Beta Globulin: 1.1 g/dL (ref 0.7–1.3)
Gamma Globulin: 1.1 g/dL (ref 0.4–1.8)
Globulin, Total: 3.2 g/dL (ref 2.2–3.9)
Total Protein ELP: 7.3 g/dL (ref 6.0–8.5)

## 2021-04-27 DIAGNOSIS — R7309 Other abnormal glucose: Secondary | ICD-10-CM | POA: Diagnosis not present

## 2021-05-21 ENCOUNTER — Encounter: Payer: Self-pay | Admitting: Medical

## 2021-05-21 ENCOUNTER — Ambulatory Visit: Payer: BC Managed Care – PPO | Admitting: Medical

## 2021-05-21 VITALS — BP 120/70 | HR 81 | Temp 97.4°F | Resp 16

## 2021-05-21 DIAGNOSIS — Z889 Allergy status to unspecified drugs, medicaments and biological substances status: Secondary | ICD-10-CM

## 2021-05-21 DIAGNOSIS — H6983 Other specified disorders of Eustachian tube, bilateral: Secondary | ICD-10-CM

## 2021-05-21 MED ORDER — PREDNISONE 10 MG (21) PO TBPK: ORAL_TABLET | ORAL | 0 refills | Status: DC

## 2021-05-21 NOTE — Progress Notes (Signed)
? ?  Subjective:  ? ? Patient ID: Susan Allison, female    DOB: 1960/11/15, 61 y.o.   MRN: [id number] ? ?HPI ?61 yo female in non acute distress with decreased hearing on the left side. ?Started one month ago in Left ear. Can hear the fluid moving and has has decreased hearing. ? ?Review of Systems  ?All other systems reviewed and are negative. ? ?   ?Objective:  ? Physical Exam ?Vitals and nursing note reviewed.  ?Constitutional:   ?   Appearance: Normal appearance.  ?HENT:  ?   Right Ear: Ear canal and external ear normal. A middle ear effusion is present.  ?   Left Ear: Ear canal and external ear normal. A middle ear effusion (worse than right) is present.  ?Pulmonary:  ?   Effort: Pulmonary effort is normal.  ?Musculoskeletal:     ?   General: Normal range of motion.  ?Skin: ?   General: Skin is warm and dry.  ?Neurological:  ?   General: No focal deficit present.  ?   Mental Status: She is alert and oriented to person, place, and time.  ?Psychiatric:     ?   Mood and Affect: Mood normal.     ?   Behavior: Behavior normal.     ?   Thought Content: Thought content normal.     ?   Judgment: Judgment normal.  ? ? ? ? ? ?   ?Assessment & Plan:  ? ?Encounter Diagnoses  ?Name Primary?  ? Dysfunction of both eustachian tubes Yes  ? Hx of seasonal allergies   ? ?Meds ordered this encounter  ?Medications  ? predniSONE (STERAPRED UNI-PAK 21 TAB) 10 MG (21) TBPK tablet  ?  Sig: Take  6 tablets by mouth today then  5 tablets tomorrow, then one less tablet everyday thereafter. Take with food.  ?  Dispense:  21 tablet  ?  Refill:  0  ?  ?Restart Zyrtec and Flonase daily per package instructions. ?Patient verbalizes understanding and has no questions at discharge. ?

## 2021-05-21 NOTE — Patient Instructions (Signed)
Eustachian Tube Dysfunction  Eustachian tube dysfunction refers to a condition in which a blockage develops in the narrow passage that connects the middle ear to the back of the nose (eustachian tube). The eustachian tube regulates air pressure in the middle ear by letting air move between the ear and nose. It also helps to drain fluid from the middle ear space. Eustachian tube dysfunction can affect one or both ears. When the eustachian tube does not function properly, air pressure, fluid, or both can build up in the middle ear. What are the causes? This condition occurs when the eustachian tube becomes blocked or cannot open normally. Common causes of this condition include: Ear infections. Colds and other infections that affect the nose, mouth, and throat (upper respiratory tract). Allergies. Irritation from cigarette smoke. Irritation from stomach acid coming up into the esophagus (gastroesophageal reflux). The esophagus is the part of the body that moves food from the mouth to the stomach. Sudden changes in air pressure, such as from descending in an airplane or scuba diving. Abnormal growths in the nose or throat, such as: Growths that line the nose (nasal polyps). Abnormal growth of cells (tumors). Enlarged tissue at the back of the throat (adenoids). What increases the risk? You are more likely to develop this condition if: You smoke. You are overweight. You are a child who has: Certain birth defects of the mouth, such as cleft palate. Large tonsils or adenoids. What are the signs or symptoms? Common symptoms of this condition include: A feeling of fullness in the ear. Ear pain. Clicking or popping noises in the ear. Ringing in the ear (tinnitus). Hearing loss. Loss of balance. Dizziness. Symptoms may get worse when the air pressure around you changes, such as when you travel to an area of high elevation, fly on an airplane, or go scuba diving. How is this diagnosed? This  condition may be diagnosed based on: Your symptoms. A physical exam of your ears, nose, and throat. Tests, such as those that measure: The movement of your eardrum. Your hearing (audiometry). How is this treated? Treatment depends on the cause and severity of your condition. In mild cases, you may relieve your symptoms by moving air into your ears. This is called "popping the ears." In more severe cases, or if you have symptoms of fluid in your ears, treatment may include: Medicines to relieve congestion (decongestants). Medicines that treat allergies (antihistamines). Nasal sprays or ear drops that contain medicines that reduce swelling (steroids). A procedure to drain the fluid in your eardrum. In this procedure, a small tube may be placed in the eardrum to: Drain the fluid. Restore the air in the middle ear space. A procedure to insert a balloon device through the nose to inflate the opening of the eustachian tube (balloon dilation). Follow these instructions at home: Lifestyle Do not do any of the following until your health care provider approves: Travel to high altitudes. Fly in airplanes. Work in a pressurized cabin or room. Scuba dive. Do not use any products that contain nicotine or tobacco. These products include cigarettes, chewing tobacco, and vaping devices, such as e-cigarettes. If you need help quitting, ask your health care provider. Keep your ears dry. Wear fitted earplugs during showering and bathing. Dry your ears completely after. General instructions Take over-the-counter and prescription medicines only as told by your health care provider. Use techniques to help pop your ears as recommended by your health care provider. These may include: Chewing gum. Yawning. Frequent, forceful swallowing.   Closing your mouth, holding your nose closed, and gently blowing as if you are trying to blow air out of your nose. Keep all follow-up visits. This is important. Contact a  health care provider if: Your symptoms do not go away after treatment. Your symptoms come back after treatment. You are unable to pop your ears. You have: A fever. Pain in your ear. Pain in your head or neck. Fluid draining from your ear. Your hearing suddenly changes. You become very dizzy. You lose your balance. Get help right away if: You have a sudden, severe increase in any of your symptoms. Summary Eustachian tube dysfunction refers to a condition in which a blockage develops in the eustachian tube. It can be caused by ear infections, allergies, inhaled irritants, or abnormal growths in the nose or throat. Symptoms may include ear pain or fullness, hearing loss, or ringing in the ears. Mild cases are treated with techniques to unblock the ears, such as yawning or chewing gum. More severe cases are treated with medicines or procedures. This information is not intended to replace advice given to you by your health care provider. Make sure you discuss any questions you have with your health care provider. Document Revised: 03/26/2020 Document Reviewed: 03/26/2020 Elsevier Patient Education  2023 Elsevier Inc.  

## 2021-05-22 ENCOUNTER — Ambulatory Visit (INDEPENDENT_AMBULATORY_CARE_PROVIDER_SITE_OTHER): Payer: BC Managed Care – PPO | Admitting: Obstetrics and Gynecology

## 2021-05-22 ENCOUNTER — Encounter: Payer: Self-pay | Admitting: Obstetrics and Gynecology

## 2021-05-22 VITALS — BP 130/72 | Ht 59.0 in | Wt 128.0 lb

## 2021-05-22 DIAGNOSIS — R011 Cardiac murmur, unspecified: Secondary | ICD-10-CM | POA: Diagnosis not present

## 2021-05-22 DIAGNOSIS — Z1231 Encounter for screening mammogram for malignant neoplasm of breast: Secondary | ICD-10-CM | POA: Diagnosis not present

## 2021-05-22 DIAGNOSIS — R0602 Shortness of breath: Secondary | ICD-10-CM | POA: Diagnosis not present

## 2021-05-22 DIAGNOSIS — Z01419 Encounter for gynecological examination (general) (routine) without abnormal findings: Secondary | ICD-10-CM | POA: Diagnosis not present

## 2021-05-22 NOTE — Patient Instructions (Signed)
I value your feedback and you entrusting us with your care. If you get a [facility] patient survey, I would appreciate you taking the time to let us know about your experience today. Thank you! ? ? ?

## 2021-05-22 NOTE — Progress Notes (Addendum)
? ?PCP: McLean-Scocuzza, Nino Glow, MD ? ? ?Chief Complaint  ?Patient presents with  ? Gynecologic Exam  ?  No concerns  ? ? ?HPI: ?     [name] is a 61 y.o. [phone] who LMP was No LMP recorded. Patient is postmenopausal., presents today for her annual examination.  Her menses are absent due to menopause. She does not have PMB. She does not have vasomotor sx.  ? ?Sex activity: Rarely sex active. She does not have vaginal dryness. ? ?Last Pap: 03/25/18  Results were: no abnormalities /neg HPV DNA.  ?Hx of STDs: none ? ?Last mammogram: 08/09/20  Results were: normal--routine follow-up in 12 months; has appt 7/23 ?There is a FH of breast cancer in her mat niece, genetic testing not indicated for pt. Pt unsure if niece had genetic testing. There is no FH of ovarian cancer. The patient does do self-breast exams. ? ?Colonoscopy: colonoscopy 1/21 at [facility] without abnormalities.  Repeat due after 10 years per pt ? ?Tobacco use: The patient denies current or previous tobacco use. ?Alcohol use: none  ?No drug use ?Exercise: min active ? ?She does get adequate calcium and Vitamin D in her diet. ? ?Labs with PCP.  ?Hx of mild heart murmur. Saw [facility] cardio in past with eval, "murmur was mild". Now having some SOB at rest. No chest pain.  ? ? ?Past Medical History:  ?Diagnosis Date  ? Arthritis   ? COVID-19   ? 10/2020  ? GERD (gastroesophageal reflux disease)   ? Hypertension   ? Joint pain   ? Leukocytosis 09/26/2020  ? New dx  ? Mastodynia   ? Sinus problem   ? ? ?Past Surgical History:  ?Procedure Laterality Date  ? COLONOSCOPY  2011, 2021  ? ESOPHAGOGASTRODUODENOSCOPY N/A 08/14/2014  ? Procedure: ESOPHAGOGASTRODUODENOSCOPY (EGD);  Surgeon: Hulen Luster, MD;  Location: [facility] ENDOSCOPY;  Service: Gastroenterology;  Laterality: N/A;  ? ? ?Family History  ?Problem Relation Age of Onset  ? Hypertension Mother   ? Liver disease Mother   ?     nash with cirrhosis  ? Other Mother   ?     brain tumor   ? Prostate cancer Father 59  ?  Arthritis/Rheumatoid Sister   ? Diabetes Brother   ? Heart attack Maternal Grandmother   ? Lupus Maternal Aunt   ? Breast cancer Other   ?     41s, has contact  ? ? ?Social History  ? ?Socioeconomic History  ? Marital status: Married  ?  Spouse name: Not on file  ? Number of children: 2  ? Years of education: Not on file  ? Highest education level: Bachelor's degree (e.g., BA, AB, BS)  ?Occupational History  ? Not on file  ?Tobacco Use  ? Smoking status: Never  ? Smokeless tobacco: Never  ?Vaping Use  ? Vaping Use: Never used  ?Substance and Sexual Activity  ? Alcohol use: Never  ? Drug use: Never  ? Sexual activity: Yes  ?  Birth control/protection: Post-menopausal  ?Other Topics Concern  ? Not on file  ?Social History Narrative  ? Married.  ? 2 children. (sons)   ? 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 Enjoys gardening, Secretary/administrator, baking, movies.   ? Lives at home with spouse & children  ? Left handed  ? Caffeine: no  ? ?Social Determinants of Health  ? ?Financial Resource Strain: Not on file  ?  Food Insecurity: Not on file  ?Transportation Needs: Not on file  ?Physical Activity: Not on file  ?Stress: Not on file  ?Social Connections: Not on file  ?Intimate Partner Violence: Not on file  ? ? ?Current Meds  ?Medication Sig  ? amLODipine (NORVASC) 5 MG tablet Take 1 tablet (5 mg total) by mouth daily at 6 PM. D/c 2.5  ? azelastine (ASTELIN) 0.1 % nasal spray Place 2 sprays into both nostrils as needed. Use in each nostril as directed  ? calcium carbonate (OS-CAL) 600 MG TABS tablet Take 600 mg by mouth daily.   ? cetirizine (ZYRTEC) 10 MG tablet Take 10 mg by mouth as needed.   ? cholecalciferol (VITAMIN D) 1000 units tablet Take 1,000 Units by mouth once a week.  ? Cyanocobalamin (VITAMIN B-12 PO) Take by mouth daily at 12 noon.  ? fluticasone (FLONASE) 50 MCG/ACT nasal spray Place 1-2 sprays into both nostrils daily.  ? levothyroxine (SYNTHROID) 50 MCG tablet TAKE ONE TABLET BY  MOUTH EVERY MORNING 30 MINUTES BEFORE BREAKFAST  ? predniSONE (STERAPRED UNI-PAK 21 TAB) 10 MG (21) TBPK tablet Take  6 tablets by mouth today then  5 tablets tomorrow, then one less tablet everyday thereafter. Take with food.  ? ? ? ? ?ROS: ? ?Review of Systems  ?Constitutional:  Negative for fatigue, fever and unexpected weight change.  ?Respiratory:  Positive for shortness of breath. Negative for cough and wheezing.   ?Cardiovascular:  Negative for chest pain, palpitations and leg swelling.  ?Gastrointestinal:  Negative for blood in stool, constipation, diarrhea, nausea and vomiting.  ?Endocrine: Negative for cold intolerance, heat intolerance and polyuria.  ?Genitourinary:  Negative for dyspareunia, dysuria, flank pain, frequency, genital sores, hematuria, menstrual problem, pelvic pain, urgency, vaginal bleeding, vaginal discharge and vaginal pain.  ?Musculoskeletal:  Positive for arthralgias. Negative for back pain, joint swelling and myalgias.  ?Skin:  Negative for rash.  ?Neurological:  Negative for dizziness, syncope, light-headedness, numbness and headaches.  ?Hematological:  Negative for adenopathy.  ?Psychiatric/Behavioral:  Negative for agitation, confusion, sleep disturbance and suicidal ideas. The patient is not nervous/anxious.   ? ? ?Objective: ?BP 130/72   Ht 4\' 11"  (1.499 m)   Wt 128 lb (58.1 kg)   BMI 25.85 kg/m?  ? ? ?Physical Exam ?Constitutional:   ?   Appearance: She is well-developed.  ?Genitourinary:  ?   Vulva normal.  ?   Right Labia: No rash, tenderness or lesions. ?   Left Labia: No tenderness, lesions or rash. ?   No vaginal discharge, erythema or tenderness.  ? ?   Right Adnexa: not tender and no mass present. ?   Left Adnexa: not tender and no mass present. ?   No cervical friability or polyp.  ?   Uterus is not enlarged or tender.  ?Breasts: ?   Right: No mass, nipple discharge, skin change or tenderness.  ?   Left: No mass, nipple discharge, skin change or tenderness.  ?Neck:  ?    Thyroid: No thyromegaly.  ?Cardiovascular:  ?   Rate and Rhythm: Normal rate and regular rhythm.  ?   Heart sounds: Murmur heard.  ?Systolic murmur is present with a grade of 2/6.  ?No diastolic murmur is present.  ?Pulmonary:  ?   Effort: Pulmonary effort is normal.  ?   Breath sounds: Normal breath sounds.  ?Abdominal:  ?   Palpations: Abdomen is soft.  ?   Tenderness: There is no abdominal tenderness. There is no  guarding or rebound.  ?Musculoskeletal:     ?   General: Normal range of motion.  ?   Cervical back: Normal range of motion.  ?Lymphadenopathy:  ?   Cervical: No cervical adenopathy.  ?Neurological:  ?   General: No focal deficit present.  ?   Mental Status: She is alert and oriented to person, place, and time.  ?   Cranial Nerves: No cranial nerve deficit.  ?Skin: ?   General: Skin is warm and dry.  ?Psychiatric:     ?   Mood and Affect: Mood normal.     ?   Behavior: Behavior normal.     ?   Thought Content: Thought content normal.     ?   Judgment: Judgment normal.  ?Vitals reviewed.  ? ?Assessment/Plan: ?Encounter for annual routine gynecological examination ? ?Encounter for screening mammogram for malignant neoplasm of breast; pt has appt ? ?Heart murmur - Plan: Ambulatory referral to Cardiology; refer to [facility] cardio for eval.  ? ?Shortness of breath - Plan: Ambulatory referral to Cardiology ? ?        ?GYN counsel breast self exam, mammography screening, menopause, adequate intake of calcium and vitamin D, diet and exercise ? ?  F/U ? Return in about 1 year (around 05/23/2022). ? ?Duval Macleod B. Aniesa Boback, PA-C ?05/22/2021 ?11:05 AM ?

## 2021-05-27 DIAGNOSIS — R7309 Other abnormal glucose: Secondary | ICD-10-CM | POA: Diagnosis not present

## 2021-06-05 DIAGNOSIS — R0602 Shortness of breath: Secondary | ICD-10-CM | POA: Diagnosis not present

## 2021-06-05 DIAGNOSIS — R011 Cardiac murmur, unspecified: Secondary | ICD-10-CM | POA: Diagnosis not present

## 2021-06-05 DIAGNOSIS — R002 Palpitations: Secondary | ICD-10-CM | POA: Diagnosis not present

## 2021-06-05 DIAGNOSIS — I10 Essential (primary) hypertension: Secondary | ICD-10-CM | POA: Diagnosis not present

## 2021-06-20 DIAGNOSIS — R011 Cardiac murmur, unspecified: Secondary | ICD-10-CM | POA: Diagnosis not present

## 2021-06-20 DIAGNOSIS — R0602 Shortness of breath: Secondary | ICD-10-CM | POA: Diagnosis not present

## 2021-06-26 ENCOUNTER — Telehealth: Payer: Self-pay | Admitting: Internal Medicine

## 2021-06-26 ENCOUNTER — Other Ambulatory Visit: Payer: Self-pay | Admitting: Internal Medicine

## 2021-06-26 DIAGNOSIS — I10 Essential (primary) hypertension: Secondary | ICD-10-CM

## 2021-06-26 MED ORDER — AMLODIPINE BESYLATE 5 MG PO TABS: 5.0000 mg | ORAL_TABLET | Freq: Every day | ORAL | 3 refills | Status: DC

## 2021-06-26 NOTE — Telephone Encounter (Signed)
Pt need refill on amlodipine sent to [facility] [address]

## 2021-06-27 DIAGNOSIS — R7309 Other abnormal glucose: Secondary | ICD-10-CM | POA: Diagnosis not present

## 2021-07-02 DIAGNOSIS — R002 Palpitations: Secondary | ICD-10-CM | POA: Diagnosis not present

## 2021-07-02 DIAGNOSIS — I10 Essential (primary) hypertension: Secondary | ICD-10-CM | POA: Diagnosis not present

## 2021-07-02 DIAGNOSIS — I38 Endocarditis, valve unspecified: Secondary | ICD-10-CM | POA: Diagnosis not present

## 2021-07-02 DIAGNOSIS — I272 Pulmonary hypertension, unspecified: Secondary | ICD-10-CM | POA: Diagnosis not present

## 2021-07-22 DIAGNOSIS — I272 Pulmonary hypertension, unspecified: Secondary | ICD-10-CM | POA: Diagnosis not present

## 2021-07-22 DIAGNOSIS — R0602 Shortness of breath: Secondary | ICD-10-CM | POA: Diagnosis not present

## 2021-07-27 DIAGNOSIS — R7309 Other abnormal glucose: Secondary | ICD-10-CM | POA: Diagnosis not present

## 2021-08-05 DIAGNOSIS — R0609 Other forms of dyspnea: Secondary | ICD-10-CM | POA: Diagnosis not present

## 2021-08-12 ENCOUNTER — Ambulatory Visit
Admission: RE | Admit: 2021-08-12 | Discharge: 2021-08-12 | Disposition: A | Discharge status: Home / Self Care | Payer: BC Managed Care – PPO | Source: Ambulatory Visit | Attending: Obstetrics and Gynecology | Admitting: Obstetrics and Gynecology

## 2021-08-12 DIAGNOSIS — Z1231 Encounter for screening mammogram for malignant neoplasm of breast: Secondary | ICD-10-CM | POA: Diagnosis not present

## 2021-08-27 DIAGNOSIS — R7309 Other abnormal glucose: Secondary | ICD-10-CM | POA: Diagnosis not present

## 2021-09-27 DIAGNOSIS — R7309 Other abnormal glucose: Secondary | ICD-10-CM | POA: Diagnosis not present

## 2021-10-27 DIAGNOSIS — R7309 Other abnormal glucose: Secondary | ICD-10-CM | POA: Diagnosis not present

## 2021-11-27 DIAGNOSIS — R7309 Other abnormal glucose: Secondary | ICD-10-CM | POA: Diagnosis not present

## 2021-12-12 DIAGNOSIS — H524 Presbyopia: Secondary | ICD-10-CM | POA: Diagnosis not present

## 2021-12-12 DIAGNOSIS — H40003 Preglaucoma, unspecified, bilateral: Secondary | ICD-10-CM | POA: Diagnosis not present

## 2021-12-27 DIAGNOSIS — R7309 Other abnormal glucose: Secondary | ICD-10-CM | POA: Diagnosis not present

## 2022-01-27 DIAGNOSIS — R7309 Other abnormal glucose: Secondary | ICD-10-CM | POA: Diagnosis not present

## 2022-02-27 DIAGNOSIS — R7309 Other abnormal glucose: Secondary | ICD-10-CM | POA: Diagnosis not present

## 2022-03-04 ENCOUNTER — Encounter: Payer: BC Managed Care – PPO | Admitting: Nurse Practitioner

## 2022-03-20 ENCOUNTER — Encounter: Payer: Self-pay | Admitting: Adult Health

## 2022-03-20 ENCOUNTER — Ambulatory Visit (INDEPENDENT_AMBULATORY_CARE_PROVIDER_SITE_OTHER): Payer: Self-pay | Admitting: Adult Health

## 2022-03-20 VITALS — BP 138/74 | HR 92 | Temp 97.8°F | Wt 129.8 lb

## 2022-03-20 DIAGNOSIS — E039 Hypothyroidism, unspecified: Secondary | ICD-10-CM

## 2022-03-20 DIAGNOSIS — R7989 Other specified abnormal findings of blood chemistry: Secondary | ICD-10-CM

## 2022-03-20 DIAGNOSIS — H938X2 Other specified disorders of left ear: Secondary | ICD-10-CM

## 2022-03-20 DIAGNOSIS — E538 Deficiency of other specified B group vitamins: Secondary | ICD-10-CM

## 2022-03-20 DIAGNOSIS — I10 Essential (primary) hypertension: Secondary | ICD-10-CM

## 2022-03-20 MED ORDER — AMLODIPINE BESYLATE 5 MG PO TABS
5.0000 mg | ORAL_TABLET | Freq: Every day | ORAL | 1 refills | Status: DC
Start: 2022-03-20 — End: 2022-09-24

## 2022-03-20 MED ORDER — LEVOTHYROXINE SODIUM 50 MCG PO TABS: ORAL_TABLET | ORAL | 1 refills | Status: DC

## 2022-03-20 NOTE — Progress Notes (Signed)
[facility] [address], [place] [address]   Office Visit Note  Patient Name: Susan Allison Date of Birth [id number]  Medical Record number [id number]  Date of Service: 03/20/2022  Chief Complaint  Patient presents with   Medication Refill    Med refill, labs, left ear fullness. Runny nose,pain in neck last week.     Medication Refill Pertinent negatives include no chest pain, chills, coughing, fatigue, fever, sore throat or vomiting.   Pt is here for acute visit. She is requesting refills on two medications due to being between PCP's at this time.  She need synthroid and amlodipine refilled.    She also reports some ear fullness on the left side.  She has allergies and takes medications but once a year or so, she has issues. This has been present for a few weeks this time.    She also would like some routine labs at this time.    Current Medication:  Outpatient Encounter Medications as of 03/20/2022  Medication Sig Note   azelastine (ASTELIN) 0.1 % nasal spray Place 2 sprays into both nostrils as needed. Use in each nostril as directed    calcium carbonate (OS-CAL) 600 MG TABS tablet Take 600 mg by mouth daily.     cetirizine (ZYRTEC) 10 MG tablet Take 10 mg by mouth as needed.     cholecalciferol (VITAMIN D) 1000 units tablet Take 1,000 Units by mouth once a week. 03/30/2018: Only takes once a week   Cyanocobalamin (VITAMIN B-12 PO) Take by mouth daily at 12 noon.    fluticasone (FLONASE) 50 MCG/ACT nasal spray Place 1-2 sprays into both nostrils daily.    [DISCONTINUED] amLODipine (NORVASC) 5 MG tablet Take 1 tablet (5 mg total) by mouth daily at 6 PM. D/c 2.5    [DISCONTINUED] levothyroxine (SYNTHROID) 50 MCG tablet TAKE ONE TABLET BY MOUTH EVERY MORNING 30 MINUTES BEFORE BREAKFAST    amLODipine (NORVASC) 5 MG tablet Take 1 tablet (5 mg total) by mouth daily at 6 PM. D/c 2.5    levothyroxine (SYNTHROID) 50 MCG tablet TAKE ONE TABLET BY MOUTH EVERY MORNING 30  MINUTES BEFORE BREAKFAST    predniSONE (STERAPRED UNI-PAK 21 TAB) 10 MG (21) TBPK tablet Take  6 tablets by mouth today then  5 tablets tomorrow, then one less tablet everyday thereafter. Take with food. (Patient not taking: Reported on 03/20/2022)    No facility-administered encounter medications on file as of 03/20/2022.      Medical History: Past Medical History:  Diagnosis Date   Arthritis    COVID-19    10/2020   GERD (gastroesophageal reflux disease)    Hypertension    Joint pain    Leukocytosis 09/26/2020   New dx   Mastodynia    Sinus problem      Vital Signs: BP 138/74 (BP Location: Left Arm, Patient Position: Sitting, Cuff Size: Normal)   Pulse 92   Temp 97.8 F (36.6 C) (Tympanic)   Wt 129 lb 12.8 oz (58.9 kg)   SpO2 98%   BMI 26.22 kg/m    Review of Systems  Constitutional:  Negative for chills, fatigue and fever.  HENT:  Positive for ear pain. Negative for sore throat and trouble swallowing.   Eyes:  Negative for pain and itching.  Respiratory:  Negative for cough.   Cardiovascular:  Negative for chest pain.  Gastrointestinal:  Negative for diarrhea and vomiting.    Physical Exam Vitals and nursing note reviewed.  Constitutional:      Appearance: Normal appearance.  HENT:     Head: Normocephalic.     Right Ear: Tympanic membrane and ear canal normal.     Left Ear: Tympanic membrane and ear canal normal.     Mouth/Throat:     Mouth: Mucous membranes are moist.  Eyes:     Pupils: Pupils are equal, round, and reactive to light.  Pulmonary:     Effort: Pulmonary effort is normal.  Lymphadenopathy:     Cervical: No cervical adenopathy.  Neurological:     Mental Status: She is alert.    Assessment/Plan: 1. Ear pressure, left Discussed using Daily allergy medication such as Zyrtec or Claritin. Also instructed patient to use Flonase, Two sprays in each nostril twice daily. Consider decongestant use that is safe for HTn.   2. Hypothyroidism,  unspecified type - levothyroxine (SYNTHROID) 50 MCG tablet; TAKE ONE TABLET BY MOUTH EVERY MORNING 30 MINUTES BEFORE BREAKFAST  Dispense: 90 tablet; Refill: 1 - TSH; Future  3. Essential hypertension - amLODipine (NORVASC) 5 MG tablet; Take 1 tablet (5 mg total) by mouth daily at 6 PM. D/c 2.5  Dispense: 90 tablet; Refill: 1 - Comprehensive metabolic panel; Future - Lipid Panel With LDL/HDL Ratio; Future  4. Low vitamin D level - VITAMIN D 25 Hydroxy (Vit-D Deficiency, Fractures); Future  5. Vitamin B 12 deficiency - B12 and Folate Panel; Future     General Counseling: ashyla mullings understanding of the findings of todays visit and agrees with plan of treatment. I have discussed any further diagnostic evaluation that may be needed or ordered today. We also reviewed her medications today. she has been encouraged to call the office with any questions or concerns that should arise related to todays visit.   No orders of the defined types were placed in this encounter.   Meds ordered this encounter  Medications   levothyroxine (SYNTHROID) 50 MCG tablet    Sig: TAKE ONE TABLET BY MOUTH EVERY MORNING 30 MINUTES BEFORE BREAKFAST    Dispense:  90 tablet    Refill:  1    This prescription was filled on 02/06/2021. Any refills authorized will be placed on file.   amLODipine (NORVASC) 5 MG tablet    Sig: Take 1 tablet (5 mg total) by mouth daily at 6 PM. D/c 2.5    Dispense:  90 tablet    Refill:  1    Time spent:20 Minutes    Kendell Bane AGNP-C Nurse Practitioner

## 2022-03-21 ENCOUNTER — Other Ambulatory Visit: Payer: Self-pay

## 2022-03-21 DIAGNOSIS — R7989 Other specified abnormal findings of blood chemistry: Secondary | ICD-10-CM

## 2022-03-21 DIAGNOSIS — I10 Essential (primary) hypertension: Secondary | ICD-10-CM

## 2022-03-21 DIAGNOSIS — E039 Hypothyroidism, unspecified: Secondary | ICD-10-CM

## 2022-03-21 DIAGNOSIS — E538 Deficiency of other specified B group vitamins: Secondary | ICD-10-CM

## 2022-03-24 LAB — LIPID PANEL WITH LDL/HDL RATIO
Cholesterol, Total: 158 mg/dL (ref 100–199)
HDL: 52 mg/dL (ref >39)
LDL Chol Calc (NIH): 95 mg/dL (ref 0–99)
LDL/HDL Ratio: 1.8 ratio (ref 0.0–3.2)
Triglycerides: 54 mg/dL (ref 0–149)
VLDL Cholesterol Cal: 11 mg/dL (ref 5–40)

## 2022-03-24 LAB — COMPREHENSIVE METABOLIC PANEL
ALT: 15 IU/L (ref 0–32)
AST: 19 IU/L (ref 0–40)
Albumin/Globulin Ratio: 2 (ref 1.2–2.2)
Albumin: 4.8 g/dL (ref 3.9–4.9)
Alkaline Phosphatase: 145 IU/L — ABNORMAL HIGH (ref 44–121)
BUN/Creatinine Ratio: 8 — ABNORMAL LOW (ref 12–28)
BUN: 9 mg/dL (ref 8–27)
Bilirubin Total: 0.3 mg/dL (ref 0.0–1.2)
CO2: 24 mmol/L (ref 20–29)
Calcium: 9.3 mg/dL (ref 8.7–10.3)
Chloride: 106 mmol/L (ref 96–106)
Creatinine, Ser: 1.09 mg/dL — ABNORMAL HIGH (ref 0.57–1.00)
Globulin, Total: 2.4 g/dL (ref 1.5–4.5)
Glucose: 93 mg/dL (ref 70–99)
Potassium: 4.4 mmol/L (ref 3.5–5.2)
Sodium: 143 mmol/L (ref 134–144)
Total Protein: 7.2 g/dL (ref 6.0–8.5)
eGFR: 57 mL/min/{1.73_m2} — ABNORMAL LOW (ref >59)

## 2022-03-24 LAB — B12 AND FOLATE PANEL
Folate: 10.3 ng/mL (ref >3.0)
Vitamin B-12: 554 pg/mL (ref 232–1245)

## 2022-03-24 LAB — VITAMIN D 25 HYDROXY (VIT D DEFICIENCY, FRACTURES): Vit D, 25-Hydroxy: 25.3 ng/mL — ABNORMAL LOW (ref 30.0–100.0)

## 2022-03-24 LAB — TSH: TSH: 3.7 u[IU]/mL (ref 0.450–4.500)

## 2022-03-27 ENCOUNTER — Encounter: Payer: Self-pay | Admitting: Adult Health

## 2022-03-28 DIAGNOSIS — R7309 Other abnormal glucose: Secondary | ICD-10-CM | POA: Diagnosis not present

## 2022-03-31 ENCOUNTER — Ambulatory Visit (INDEPENDENT_AMBULATORY_CARE_PROVIDER_SITE_OTHER): Payer: Self-pay | Admitting: Physician Assistant

## 2022-03-31 ENCOUNTER — Other Ambulatory Visit: Payer: Self-pay

## 2022-03-31 ENCOUNTER — Encounter: Payer: Self-pay | Admitting: Physician Assistant

## 2022-03-31 VITALS — BP 124/70 | HR 86 | Temp 98.9°F | Resp 18 | Wt 130.2 lb

## 2022-03-31 DIAGNOSIS — H8393 Unspecified disease of inner ear, bilateral: Secondary | ICD-10-CM

## 2022-03-31 DIAGNOSIS — J0140 Acute pansinusitis, unspecified: Secondary | ICD-10-CM

## 2022-03-31 MED ORDER — METHYLPREDNISOLONE 4 MG PO TBPK: ORAL_TABLET | ORAL | 0 refills | Status: DC

## 2022-03-31 NOTE — Progress Notes (Signed)
[facility] [address], [place] [address]   Office Visit Note  Patient Name: Susan Allison Date of Birth [id number]  Medical Record number [id number]  Date of Service: 03/31/2022  Chief Complaint  Patient presents with   Sinusitis     62 y/o F presents to the clinic for c/o sinus pain, pressure, congestion, b/l ear congested and occasional cough worse at bedtime x 2 days. She has been battling similar symptoms for the past 2 weeks. Denies fever, chills, body aches, CP, SOB, or wheezing. Is currently using anti-histamine and nasal steroid spray. She denies known exposure to flu, Covid, or strep.   Sinusitis Associated symptoms include congestion and sinus pressure. Pertinent negatives include no ear pain (ear fullness) or sore throat.      Current Medication:  Outpatient Encounter Medications as of 03/31/2022  Medication Sig Note   amLODipine (NORVASC) 5 MG tablet Take 1 tablet (5 mg total) by mouth daily at 6 PM. D/c 2.5    azelastine (ASTELIN) 0.1 % nasal spray Place 2 sprays into both nostrils as needed. Use in each nostril as directed    calcium carbonate (OS-CAL) 600 MG TABS tablet Take 600 mg by mouth daily.     cetirizine (ZYRTEC) 10 MG tablet Take 10 mg by mouth as needed.     cholecalciferol (VITAMIN D) 1000 units tablet Take 1,000 Units by mouth once a week. 03/30/2018: Only takes once a week   Cyanocobalamin (VITAMIN B-12 PO) Take by mouth daily at 12 noon.    fluticasone (FLONASE) 50 MCG/ACT nasal spray Place 1-2 sprays into both nostrils daily.    levothyroxine (SYNTHROID) 50 MCG tablet TAKE ONE TABLET BY MOUTH EVERY MORNING 30 MINUTES BEFORE BREAKFAST    methylPREDNISolone (MEDROL DOSEPAK) 4 MG TBPK tablet Take as directed    [DISCONTINUED] predniSONE (STERAPRED UNI-PAK 21 TAB) 10 MG (21) TBPK tablet Take  6 tablets by mouth today then  5 tablets tomorrow, then one less tablet everyday thereafter. Take with food. (Patient not taking: Reported on  03/20/2022)    No facility-administered encounter medications on file as of 03/31/2022.      Medical History: Past Medical History:  Diagnosis Date   Arthritis    COVID-19    10/2020   GERD (gastroesophageal reflux disease)    Hypertension    Joint pain    Leukocytosis 09/26/2020   New dx   Mastodynia    Sinus problem      Vital Signs: BP 124/70   Pulse 86   Temp 98.9 F (37.2 C) (Tympanic)   Resp 18   Wt 130 lb 3.2 oz (59.1 kg)   SpO2 99%   BMI 26.30 kg/m    Review of Systems  Constitutional: Negative.   HENT:  Positive for congestion, postnasal drip, sinus pressure, sinus pain and trouble swallowing. Negative for ear pain (ear fullness) and sore throat.   Eyes: Negative.   Respiratory: Negative.    Neurological: Negative.     Physical Exam Constitutional:      Appearance: Normal appearance.  HENT:     Head: Atraumatic.     Right Ear: Ear canal and external ear normal.     Left Ear: Ear canal and external ear normal.     Ears:     Comments: Air-fluid level present in both ears    Nose:     Right Turbinates: Enlarged and swollen.     Left Turbinates: Enlarged and swollen.  Right Sinus: Maxillary sinus tenderness and frontal sinus tenderness present.     Left Sinus: Maxillary sinus tenderness and frontal sinus tenderness present.     Mouth/Throat:     Mouth: Mucous membranes are moist.     Pharynx: Oropharynx is clear.  Eyes:     Extraocular Movements: Extraocular movements intact.  Cardiovascular:     Rate and Rhythm: Normal rate and regular rhythm.  Pulmonary:     Effort: Pulmonary effort is normal.     Breath sounds: Normal breath sounds.  Musculoskeletal:     Cervical back: Neck supple.  Skin:    General: Skin is warm.  Neurological:     Mental Status: She is alert.  Psychiatric:        Mood and Affect: Mood normal.        Behavior: Behavior normal.        Thought Content: Thought content normal.        Judgment: Judgment normal.        Assessment/Plan:  1. Inner ear dysfunction, bilateral - methylPREDNISolone (MEDROL DOSEPAK) 4 MG TBPK tablet; Take as directed  Dispense: 1 each; Refill: 0  2. Acute non-recurrent pansinusitis - methylPREDNISolone (MEDROL DOSEPAK) 4 MG TBPK tablet; Take as directed  Dispense: 1 each; Refill: 0  Reviewed my clinical findings with patient. Continue with nasal spray and oral anti-histamine as previously instructed. Increase fluids Start oral steroids. Will consider oral antibiotic, if patient doesn't improve or complains of worsening symptoms.  Deferred oral sudafed as patient has h/o HTN on medicine. Continue to watch for worsening symptoms. RTC prn Pt verbalized understanding and in agreement.   General Counseling: Susan Allison understanding of the findings of todays visit and agrees with plan of treatment. I have discussed any further diagnostic evaluation that may be needed or ordered today. We also reviewed her medications today. she has been encouraged to call the office with any questions or concerns that should arise related to todays visit.    Time spent:20 [place], [place] Physician Assistant

## 2022-04-09 DIAGNOSIS — H6982 Other specified disorders of Eustachian tube, left ear: Secondary | ICD-10-CM | POA: Diagnosis not present

## 2022-04-09 DIAGNOSIS — H9012 Conductive hearing loss, unilateral, left ear, with unrestricted hearing on the contralateral side: Secondary | ICD-10-CM | POA: Diagnosis not present

## 2022-04-09 DIAGNOSIS — H6502 Acute serous otitis media, left ear: Secondary | ICD-10-CM | POA: Diagnosis not present

## 2022-04-25 ENCOUNTER — Encounter: Payer: BC Managed Care – PPO | Admitting: Nurse Practitioner

## 2022-04-28 DIAGNOSIS — R7309 Other abnormal glucose: Secondary | ICD-10-CM | POA: Diagnosis not present

## 2022-04-30 ENCOUNTER — Encounter: Payer: BC Managed Care – PPO | Admitting: Nurse Practitioner

## 2022-05-04 ENCOUNTER — Encounter: Payer: Self-pay | Admitting: *Deleted

## 2022-05-04 ENCOUNTER — Emergency Department: Payer: BC Managed Care – PPO

## 2022-05-04 ENCOUNTER — Other Ambulatory Visit: Payer: Self-pay

## 2022-05-04 ENCOUNTER — Emergency Department
Admission: EM | Admit: 2022-05-04 | Discharge: 2022-05-04 | Disposition: A | Discharge status: Home / Self Care | Payer: BC Managed Care – PPO | Attending: Emergency Medicine | Admitting: Emergency Medicine

## 2022-05-04 DIAGNOSIS — E860 Dehydration: Secondary | ICD-10-CM | POA: Diagnosis not present

## 2022-05-04 DIAGNOSIS — R7989 Other specified abnormal findings of blood chemistry: Secondary | ICD-10-CM | POA: Diagnosis not present

## 2022-05-04 DIAGNOSIS — R002 Palpitations: Secondary | ICD-10-CM | POA: Diagnosis not present

## 2022-05-04 DIAGNOSIS — R000 Tachycardia, unspecified: Secondary | ICD-10-CM | POA: Diagnosis not present

## 2022-05-04 LAB — CBC WITH DIFFERENTIAL/PLATELET
Abs Immature Granulocytes: 0.02 10*3/uL (ref 0.00–0.07)
Basophils Absolute: 0 10*3/uL (ref 0.0–0.1)
Basophils Relative: 0 %
Eosinophils Absolute: 0 10*3/uL (ref 0.0–0.5)
Eosinophils Relative: 0 %
HCT: 40.3 % (ref 36.0–46.0)
Hemoglobin: 12.9 g/dL (ref 12.0–15.0)
Immature Granulocytes: 0 %
Lymphocytes Relative: 16 %
Lymphs Abs: 0.9 10*3/uL (ref 0.7–4.0)
MCH: 28.3 pg (ref 26.0–34.0)
MCHC: 32 g/dL (ref 30.0–36.0)
MCV: 88.4 fL (ref 80.0–100.0)
Monocytes Absolute: 0.3 10*3/uL (ref 0.1–1.0)
Monocytes Relative: 6 %
Neutro Abs: 4.3 10*3/uL (ref 1.7–7.7)
Neutrophils Relative %: 78 %
Platelets: 195 10*3/uL (ref 150–400)
RBC: 4.56 MIL/uL (ref 3.87–5.11)
RDW: 15.7 % — ABNORMAL HIGH (ref 11.5–15.5)
WBC: 5.5 10*3/uL (ref 4.0–10.5)
nRBC: 0 % (ref 0.0–0.2)

## 2022-05-04 LAB — COMPREHENSIVE METABOLIC PANEL
ALT: 23 U/L (ref 0–44)
AST: 25 U/L (ref 15–41)
Albumin: 4.2 g/dL (ref 3.5–5.0)
Alkaline Phosphatase: 150 U/L — ABNORMAL HIGH (ref 38–126)
Anion gap: 8 (ref 5–15)
BUN: 11 mg/dL (ref 8–23)
CO2: 24 mmol/L (ref 22–32)
Calcium: 8.9 mg/dL (ref 8.9–10.3)
Chloride: 107 mmol/L (ref 98–111)
Creatinine, Ser: 0.94 mg/dL (ref 0.44–1.00)
GFR, Estimated: >60 mL/min (ref >60)
Glucose, Bld: 135 mg/dL — ABNORMAL HIGH (ref 70–99)
Potassium: 3.5 mmol/L (ref 3.5–5.1)
Sodium: 139 mmol/L (ref 135–145)
Total Bilirubin: 0.6 mg/dL (ref 0.3–1.2)
Total Protein: 7.4 g/dL (ref 6.5–8.1)

## 2022-05-04 LAB — TSH: TSH: 2.222 u[IU]/mL (ref 0.350–4.500)

## 2022-05-04 LAB — T4, FREE: Free T4: 0.78 ng/dL (ref 0.61–1.12)

## 2022-05-04 LAB — URINALYSIS, ROUTINE W REFLEX MICROSCOPIC
Bilirubin Urine: NEGATIVE
Glucose, UA: NEGATIVE mg/dL
Hgb urine dipstick: NEGATIVE
Ketones, ur: NEGATIVE mg/dL
Leukocytes,Ua: NEGATIVE
Nitrite: NEGATIVE
Protein, ur: NEGATIVE mg/dL
Specific Gravity, Urine: 1.016 (ref 1.005–1.030)
pH: 8 (ref 5.0–8.0)

## 2022-05-04 LAB — TROPONIN I (HIGH SENSITIVITY): Troponin I (High Sensitivity): 3 ng/L (ref <18)

## 2022-05-04 LAB — D-DIMER, QUANTITATIVE: D-Dimer, Quant: 1.04 ug/mL-FEU — ABNORMAL HIGH (ref 0.00–0.50)

## 2022-05-04 LAB — BRAIN NATRIURETIC PEPTIDE: B Natriuretic Peptide: 12.5 pg/mL (ref 0.0–100.0)

## 2022-05-04 LAB — MAGNESIUM: Magnesium: 2.2 mg/dL (ref 1.7–2.4)

## 2022-05-04 MED ORDER — IOHEXOL 350 MG/ML SOLN
75.0000 mL | Freq: Once | INTRAVENOUS | Status: AC | PRN
  Administered 2022-05-04: 75 mL via INTRAVENOUS

## 2022-05-04 MED ORDER — SODIUM CHLORIDE 0.9 % IV BOLUS
1000.0000 mL | Freq: Once | INTRAVENOUS | Status: AC
  Administered 2022-05-04: 1000 mL via INTRAVENOUS

## 2022-05-04 MED ORDER — METOPROLOL TARTRATE 25 MG PO TABS
25.0000 mg | ORAL_TABLET | Freq: Once | ORAL | Status: AC
  Administered 2022-05-04: 25 mg via ORAL
  Filled 2022-05-04: qty 1

## 2022-05-04 MED ORDER — METOPROLOL TARTRATE 25 MG PO TABS: 12.5000 mg | ORAL_TABLET | Freq: Once | ORAL | Status: DC

## 2022-05-04 MED ORDER — METOPROLOL TARTRATE 25 MG PO TABS: 25.0000 mg | ORAL_TABLET | Freq: Two times a day (BID) | ORAL | 0 refills | Status: DC | PRN

## 2022-05-04 NOTE — ED Notes (Signed)
Pt sent to CT

## 2022-05-04 NOTE — ED Notes (Signed)
Pt endorses some SOB, denies any pain.

## 2022-05-04 NOTE — ED Provider Notes (Signed)
[facility] Provider Note    Event Date/Time   First MD Initiated Contact with Patient 05/04/22 1016     (approximate)   History   Tachycardia   HPI  Susan Allison is a 62 y.o. female here with tachycardia.  The patient states that earlier today, she checked her vital signs and her heart rate was up to the 120s.  She otherwise felt well.  She rechecked at about 10 to 15 minutes later and hide so she presents for evaluation.  The patient has a history of intermittent tachycardia, for which she is seen by cardiology.  Denies any recent fevers or chills.  She does state that she was on steroids 2 weeks ago.  This has been what triggers her tachycardia in the past.  Denies any fevers or chills.  No other medication changes.  No urinary symptoms.  No chest pain.     Physical Exam   Triage Vital Signs: ED Triage Vitals  Enc Vitals Group     BP 05/04/22 1000 (!) 163/73     Pulse Rate 05/04/22 1000 (!) 112     Resp 05/04/22 1000 20     Temp 05/04/22 1000 98.2 F (36.8 C)     Temp Source 05/04/22 1000 Oral     SpO2 05/04/22 1000 96 %     Weight 05/04/22 1001 130 lb (59 kg)     Height 05/04/22 1001 4\' 11"  (1.499 m)     Head Circumference --      Peak Flow --      Pain Score 05/04/22 1001 0     Pain Loc --      Pain Edu? --      Excl. in GC? --     Most recent vital signs: Vitals:   05/04/22 1300 05/04/22 1423  BP: 135/75   Pulse: 79   Resp: 20   Temp:  98.6 F (37 C)  SpO2: 100%      General: Awake, no distress.  CV:  Good peripheral perfusion.  Mild tachycardia, no murmurs.   Resp:  Normal work of breathing.  Lungs clear to auscultation bilaterally. Abd:  No distention.  No tenderness.  No rebound or guarding. Other:  2+ radial pulses.  No edema.   ED Results / Procedures / Treatments   Labs (all labs ordered are listed, but only abnormal results are displayed) Labs Reviewed  D-DIMER, QUANTITATIVE - Abnormal; Notable for the following  components:      Result Value   D-Dimer, Quant 1.04 (*)    All other components within normal limits  CBC WITH DIFFERENTIAL/PLATELET - Abnormal; Notable for the following components:   RDW 15.7 (*)    All other components within normal limits  COMPREHENSIVE METABOLIC PANEL - Abnormal; Notable for the following components:   Glucose, Bld 135 (*)    Alkaline Phosphatase 150 (*)    All other components within normal limits  URINALYSIS, ROUTINE W REFLEX MICROSCOPIC - Abnormal; Notable for the following components:   Color, Urine COLORLESS (*)    APPearance CLEAR (*)    All other components within normal limits  TSH  T4, FREE  MAGNESIUM  BRAIN NATRIURETIC PEPTIDE  TROPONIN I (HIGH SENSITIVITY)     EKG Sinus tachycardia, ventricular rate 118.  PR 128, QRS 74, QTc 406.  No acute ST elevations or depressions.  No ischemia when compared to prior.   RADIOLOGY CT angio: No acute PE   I also  independently reviewed and agree with radiologist interpretations.   PROCEDURES:  Critical Care performed: No  .1-3 Lead EKG Interpretation  Performed by: Shaune Pollack, MD Authorized by: Shaune Pollack, MD     Interpretation: non-specific     ECG rate:  70-110   ECG rate assessment: tachycardic     Rhythm: sinus rhythm     Ectopy: none     Conduction: normal   Comments:     Indication: Tachycardia     MEDICATIONS ORDERED IN ED: Medications  sodium chloride 0.9 % bolus 1,000 mL (0 mLs Intravenous Stopped 05/04/22 1155)  metoprolol tartrate (LOPRESSOR) tablet 25 mg (25 mg Oral Given 05/04/22 1041)  iohexol (OMNIPAQUE) 350 MG/ML injection 75 mL (75 mLs Intravenous Contrast Given 05/04/22 1128)     IMPRESSION / MDM / ASSESSMENT AND PLAN / ED COURSE  I reviewed the triage vital signs and the nursing notes.                              Differential diagnosis includes, but is not limited to, sinus tachycardia, PSVT, unlikely A-fib, anemia, hypothyroidism, PE, pneumonia, occult  infection  Patient's presentation is most consistent with acute presentation with potential threat to life or bodily function.  The patient is on the cardiac monitor to evaluate for evidence of arrhythmia and/or significant heart rate changes   62 year old female with past medical history as above including recurrent sinus tachycardia, particularly in setting of steroid use, here with sinus tachycardia.  Suspect this is due to recent steroid use, possibly secondary to relative dehydration as she does appear dehydrated clinically.  Patient was given a dose of metoprolol and fluids with significant improvement in her heart rate and complete resolution of tachycardia.  She had absolutely no chest pain and EKG is hemic with no specific changes and negative troponin despite symptoms greater than 6 hours, do not suspect ACS.  CBC without leukocytosis or anemia.  CMP with normal electrolytes.  Magnesium normal.  BNP normal.  CT angio obtained, and is negative.  Incidental hydro noted, no flank pain.  UA without signs UTI.  Do not suspect this is related.  Patient will be encouraged to hydrate at home, as well as take metoprolol as needed tachycardia, particularly if she is otherwise asymptomatic.  She will follow-up with her cardiologist.  No apparent emergent medical condition.   FINAL CLINICAL IMPRESSION(S) / ED DIAGNOSES   Final diagnoses:  Palpitations  Dehydration     Rx / DC Orders   ED Discharge Orders          Ordered    metoprolol tartrate (LOPRESSOR) 25 MG tablet  2 times daily PRN        05/04/22 1412             Note:  This document was prepared using Dragon voice recognition software and may include unintentional dictation errors.   Shaune Pollack, MD 05/04/22 1530

## 2022-05-04 NOTE — ED Triage Notes (Signed)
Per patient's report, patient took her pulse at home and it was 114-120. Patient denies chest pain.

## 2022-05-04 NOTE — ED Notes (Signed)
Pt returned from CT. Pt voiced needing to use the restroom. Pt needed no assistance to ambulate.

## 2022-05-04 NOTE — Discharge Instructions (Addendum)
If your heart rate is >100 for more than 20 minutes despite resting and hydrating, AND you otherwise feel okay with no other symptoms, take one of the PO metoprolol prescribed here and give it 20-30 minutes to see how you feel.  Call Dr. Juliann Pares to discuss your ER visit and to arrange follow-up

## 2022-05-07 DIAGNOSIS — I272 Pulmonary hypertension, unspecified: Secondary | ICD-10-CM | POA: Diagnosis not present

## 2022-05-07 DIAGNOSIS — R002 Palpitations: Secondary | ICD-10-CM | POA: Diagnosis not present

## 2022-05-07 DIAGNOSIS — I10 Essential (primary) hypertension: Secondary | ICD-10-CM | POA: Diagnosis not present

## 2022-05-07 DIAGNOSIS — I38 Endocarditis, valve unspecified: Secondary | ICD-10-CM | POA: Diagnosis not present

## 2022-05-07 DIAGNOSIS — H6983 Other specified disorders of Eustachian tube, bilateral: Secondary | ICD-10-CM | POA: Diagnosis not present

## 2022-05-26 ENCOUNTER — Other Ambulatory Visit: Payer: Self-pay | Admitting: Internal Medicine

## 2022-05-26 DIAGNOSIS — Z1231 Encounter for screening mammogram for malignant neoplasm of breast: Secondary | ICD-10-CM

## 2022-05-28 DIAGNOSIS — R7309 Other abnormal glucose: Secondary | ICD-10-CM | POA: Diagnosis not present

## 2022-06-04 ENCOUNTER — Ambulatory Visit: Payer: BC Managed Care – PPO | Admitting: Family Medicine

## 2022-06-04 ENCOUNTER — Encounter: Payer: Self-pay | Admitting: Family Medicine

## 2022-06-04 VITALS — BP 138/72 | HR 84 | Ht 59.0 in | Wt 134.0 lb

## 2022-06-04 DIAGNOSIS — E559 Vitamin D deficiency, unspecified: Secondary | ICD-10-CM

## 2022-06-04 DIAGNOSIS — R7303 Prediabetes: Secondary | ICD-10-CM

## 2022-06-04 DIAGNOSIS — E539 Vitamin B deficiency, unspecified: Secondary | ICD-10-CM | POA: Diagnosis not present

## 2022-06-04 DIAGNOSIS — E039 Hypothyroidism, unspecified: Secondary | ICD-10-CM

## 2022-06-04 DIAGNOSIS — G8929 Other chronic pain: Secondary | ICD-10-CM

## 2022-06-04 DIAGNOSIS — M542 Cervicalgia: Secondary | ICD-10-CM

## 2022-06-04 DIAGNOSIS — I10 Essential (primary) hypertension: Secondary | ICD-10-CM

## 2022-06-04 DIAGNOSIS — M79622 Pain in left upper arm: Secondary | ICD-10-CM

## 2022-06-04 DIAGNOSIS — E785 Hyperlipidemia, unspecified: Secondary | ICD-10-CM

## 2022-06-04 DIAGNOSIS — R748 Abnormal levels of other serum enzymes: Secondary | ICD-10-CM

## 2022-06-04 LAB — COMPREHENSIVE METABOLIC PANEL
ALT: 12 U/L (ref 0–35)
AST: 17 U/L (ref 0–37)
Albumin: 4.5 g/dL (ref 3.5–5.2)
Alkaline Phosphatase: 116 U/L (ref 39–117)
BUN: 10 mg/dL (ref 6–23)
CO2: 27 mEq/L (ref 19–32)
Calcium: 9.3 mg/dL (ref 8.4–10.5)
Chloride: 105 mEq/L (ref 96–112)
Creatinine, Ser: 0.88 mg/dL (ref 0.40–1.20)
GFR: 69.91 mL/min (ref >60.00)
Glucose, Bld: 99 mg/dL (ref 70–99)
Potassium: 4.2 mEq/L (ref 3.5–5.1)
Sodium: 139 mEq/L (ref 135–145)
Total Bilirubin: 0.3 mg/dL (ref 0.2–1.2)
Total Protein: 7.4 g/dL (ref 6.0–8.3)

## 2022-06-04 LAB — HEMOGLOBIN A1C: Hgb A1c MFr Bld: 5.9 % (ref 4.6–6.5)

## 2022-06-04 LAB — VITAMIN D 25 HYDROXY (VIT D DEFICIENCY, FRACTURES): VITD: 22.35 ng/mL — ABNORMAL LOW (ref 30.00–100.00)

## 2022-06-04 LAB — VITAMIN B12: Vitamin B-12: 508 pg/mL (ref 211–911)

## 2022-06-04 NOTE — Patient Instructions (Addendum)
It was a pleasure meeting you today. Thank you for allowing me to take part in your health care.  Our goals for today as we discussed include:  Recommend Shingles vaccine.  This is a 2 dose series and can be given at your local pharmacy.  Please talk to your pharmacist about this.   PAP smear due.  Follow up with OBGYN  We will get some labs today.  If they are abnormal or we need to do something about them, I will call you.  If they are normal, I will send you a message on MyChart (if it is active) or a letter in the mail.  If you don't hear from [place] in 2 weeks, please call the office at the number below.   Will follow up with Mammogram  If you have any questions or concerns, please do not hesitate to call the office at [phone].  I look forward to our next visit and until then take care and stay safe.  Regards,   Dana Allan, MD   [facility]

## 2022-06-04 NOTE — Progress Notes (Signed)
SUBJECTIVE:   Chief Complaint  Patient presents with   Establish Care   Mass    Under left axilla/breast area, tender, unsure how long present   HPI Presents to clinic to transfer care.  Hypertension Asymptomatic.  Does not check blood pressures at home.  Takes Norvasc 5 mg daily and tolerating well.  Denies any headaches, visual changes, chest pain, shortness of breath or lower extremity edema.  History of heart palpitations Has not had further heart palpitations since evaluated by cardiology 04/24.  Was previously prescribed Lopressor 25 mg twice daily as needed for heart rate greater than 100.  Patient reports that she has not had to use this as heart rate remains less than 100.  Recent Holter monitoring benign.  Follows with Dr. Juliann Pares at [place] clinic.  Hypothyroid Asymptomatic.  Recent TSH within normal limits.  Takes Synthroid 50 mcg daily and tolerating well.  History of pulmonary hypertension Recent echo showed mild pulmonary hypertension.  Normal spirometry.  Follows with pulmonology at [facility] clinic.  Left upper breast/axilla tenderness Endorses concern of left right upper breast/axilla tenderness that intermittently radiates to left upper arm.  Reports thinks there has been increase in size to left axillary area. Denies any fevers, trauma, breast pain, nipple discharge, change in breast skin not felt any breast mass or lumps.  No numbness/weakness of upper extremities, no recent increase in activity or repetitive motions.  Weight has increased recently.  She is scheduled for mammogram 07/24.  Has not had any previous abnormal mammograms and is up-to-date.  Endorses history of neck pain.  MRI cervical spine notable for left foraminal disc protrusion at C7-T1 with potential for affecting the left C8 nerve root.  Mild left C7 foraminal stenosis.  Mild spinal stenosis with mild right C6 foraminal narrowing.  Was previously evaluated by neurosurgery at [facility], no  surgical intervention at that time.  PERTINENT PMH / PSH: Hypothyroidism Hypertension History of palpitations Mild pulmonary hypertension Cervical stenosis Lumbar stenosis DDD Herniated Lumbar/Cervical disc History carpal tunnel syndrome  OBJECTIVE:  BP 138/72   Pulse 84   Ht 4\' 11"  (1.499 m)   Wt 134 lb (60.8 kg)   SpO2 98%   BMI 27.06 kg/m    Physical Exam Vitals reviewed. Exam conducted with a chaperone present.  Constitutional:      General: She is not in acute distress.    Appearance: Normal appearance. She is not ill-appearing.  HENT:     Head: Normocephalic.     Nose: Nose normal.  Eyes:     Conjunctiva/sclera: Conjunctivae normal.  Neck:     Thyroid: No thyromegaly or thyroid tenderness.  Cardiovascular:     Rate and Rhythm: Normal rate and regular rhythm.     Pulses: Normal pulses.     Heart sounds: Normal heart sounds.  Pulmonary:     Effort: Pulmonary effort is normal.     Breath sounds: Normal breath sounds.  Chest:     Chest wall: No mass, deformity, swelling, tenderness or edema.  Breasts:    Right: Normal. No swelling, bleeding, inverted nipple, mass, nipple discharge, skin change or tenderness.     Left: Normal. No swelling, bleeding, inverted nipple, mass, nipple discharge, skin change or tenderness.  Abdominal:     General: Abdomen is flat. Bowel sounds are normal.     Palpations: Abdomen is soft.  Musculoskeletal:        General: Normal range of motion.     Right shoulder: Normal. No  swelling, tenderness or bony tenderness. Normal range of motion. Normal strength. Normal pulse.     Left shoulder: Normal. No swelling, tenderness or bony tenderness. Normal range of motion. Normal strength. Normal pulse.     Right upper arm: Normal. No swelling, edema or tenderness.     Left upper arm: Normal. No swelling, edema or tenderness.     Right elbow: Normal.     Left elbow: Normal.     Right forearm: Normal.     Left forearm: Normal.     Right hand:  Normal. Normal range of motion. Normal strength. Normal sensation. Normal pulse.     Left hand: Normal. Normal range of motion. Normal strength. Normal sensation. Normal pulse.     Cervical back: Normal and normal range of motion. No swelling, edema, rigidity, spasms, tenderness, bony tenderness or crepitus. Normal range of motion.     Thoracic back: Normal. No swelling, edema, spasms or tenderness. Normal range of motion.     Comments: Negative Spurling's sign  Lymphadenopathy:     Cervical: No cervical adenopathy.     Upper Body:     Right upper body: No supraclavicular, axillary, pectoral or epitrochlear adenopathy.     Left upper body: No supraclavicular, axillary, pectoral or epitrochlear adenopathy.  Neurological:     Mental Status: She is alert and oriented to person, place, and time. Mental status is at baseline.     Sensory: Sensation is intact. No sensory deficit.     Motor: Motor function is intact. No weakness or atrophy.     Coordination: Coordination is intact.  Psychiatric:        Mood and Affect: Mood normal.        Behavior: Behavior normal.        Thought Content: Thought content normal.        Judgment: Judgment normal.     ASSESSMENT/PLAN:  Left axillary pain Assessment & Plan: New problem.  No recent trauma, no change in breast tissue, masses, fevers, lumps.  Benign breast exam, no lymphadenopathy appreciated.  Mammogram up-to-date.  No previous history of abnormal mammograms.  Screening mammogram scheduled for 07/24.  Considered cervical nerve impingement given history of stenosis, Spurling's test negative and no weakness in upper extremities on exam. Plan for diagnostic mammogram given history of fibroglandular breast tissue and new onset of RUQ breast/axilla tenderness without trauma    Orders: -     MM 3D DIAGNOSTIC MAMMOGRAM UNILATERAL LEFT BREAST; Future  Hypertension, unspecified type Assessment & Plan: Chronic.  Well-controlled on current  medications Continue amlodipine 5 mg daily. Monitor blood pressure at home.  Goal less than 150/90 per JNC 8 guidelines for age Check c-Met   Orders: -     Comprehensive metabolic panel  Prediabetes Assessment & Plan: Chronic. Recheck A1c  Orders: -     Hemoglobin A1c  Vitamin B deficiency -     Vitamin B12  Vitamin D deficiency -     VITAMIN D 25 Hydroxy (Vit-D Deficiency, Fractures)  Hypothyroidism, unspecified type Assessment & Plan: Chronic.  Well-controlled on current medication. Recent TSH normal Continue levothyroxine 50 mcg daily Repeat TSH annually   Hyperlipidemia, unspecified hyperlipidemia type Assessment & Plan: Chronic.  Recent LDL at goal.   No indication for statin therapy at this time. Check lipids annually     Elevated liver enzymes Assessment & Plan: Resolved.  Has had normal LFTs since 2022   Chronic cervical pain Assessment & Plan: Chronic.  Possibly contributing to left  axillary tenderness that radiates down left upper arm.  Spurling's test negative, no vertebral tenderness, range of motion, sensation intact on exam. Previously seen neurosurgery with no indication for surgery at that time.  No red flags today. Can follow-up with neurosurgery if worsening symptoms.   HCM Recommend shingles vaccine.  Declined Tdap up-to-date. Last Pap 02/2018. NILM. HPV not detected.  Follows with OB/GYN.  Recommend schedule appointment for continued cervical cancer screening. Mammogram up-to-date.  Scheduled for 07/22.  No previous history of breast CVA, abnormal mammograms or family history of first-degree relative with cancer.  New onset left upper quadrant/axillary tenderness, request diagnostic mammogram. Colonoscopy up-to-date.  Due 01/2029 HIV/hepatitis C screening completed  PDMP reviewed  Return in about 6 months (around 12/05/2022) for PCP.  Total of 45 minutes spent with patient, greater than 50% of time spent face to face on counseling and  coordination of care, specifically discussion of breast cancer screening, recommendations, reassurance.     Dana Allan, MD

## 2022-06-10 ENCOUNTER — Other Ambulatory Visit: Payer: Self-pay | Admitting: Family Medicine

## 2022-06-12 ENCOUNTER — Other Ambulatory Visit: Payer: Self-pay | Admitting: Family Medicine

## 2022-06-12 DIAGNOSIS — E559 Vitamin D deficiency, unspecified: Secondary | ICD-10-CM

## 2022-06-12 MED ORDER — VITAMIN D (ERGOCALCIFEROL) 1.25 MG (50000 UNIT) PO CAPS
50000.0000 [IU] | ORAL_CAPSULE | ORAL | 1 refills | Status: DC
Start: 2022-06-12 — End: 2022-12-08

## 2022-06-20 DIAGNOSIS — E539 Vitamin B deficiency, unspecified: Secondary | ICD-10-CM | POA: Insufficient documentation

## 2022-06-20 DIAGNOSIS — E559 Vitamin D deficiency, unspecified: Secondary | ICD-10-CM | POA: Insufficient documentation

## 2022-06-20 DIAGNOSIS — M79622 Pain in left upper arm: Secondary | ICD-10-CM | POA: Insufficient documentation

## 2022-06-20 NOTE — Assessment & Plan Note (Signed)
Chronic.  Well-controlled on current medications Continue amlodipine 5 mg daily. Monitor blood pressure at home.  Goal less than 150/90 per JNC 8 guidelines for age Check c-Met

## 2022-06-20 NOTE — Assessment & Plan Note (Signed)
Chronic. Recheck A1c 

## 2022-06-20 NOTE — Assessment & Plan Note (Signed)
Chronic.  Well-controlled on current medication. Recent TSH normal Continue levothyroxine 50 mcg daily Repeat TSH annually

## 2022-06-20 NOTE — Assessment & Plan Note (Signed)
Resolved.  Has had normal LFTs since 2022

## 2022-06-20 NOTE — Assessment & Plan Note (Addendum)
New problem.  No recent trauma, no change in breast tissue, masses, fevers, lumps.  Benign breast exam, no lymphadenopathy appreciated.  Mammogram up-to-date.  No previous history of abnormal mammograms.  Screening mammogram scheduled for 07/24.  Considered cervical nerve impingement given history of stenosis, Spurling's test negative and no weakness in upper extremities on exam. Plan for diagnostic mammogram given history of fibroglandular breast tissue and new onset of RUQ breast/axilla tenderness without trauma

## 2022-06-20 NOTE — Assessment & Plan Note (Signed)
Chronic.  Possibly contributing to left axillary tenderness that radiates down left upper arm.  Spurling's test negative, no vertebral tenderness, range of motion, sensation intact on exam. Previously seen neurosurgery with no indication for surgery at that time.  No red flags today. Can follow-up with neurosurgery if worsening symptoms.

## 2022-06-20 NOTE — Assessment & Plan Note (Signed)
Chronic.  Recent LDL at goal.   No indication for statin therapy at this time. Check lipids annually

## 2022-06-28 DIAGNOSIS — R7309 Other abnormal glucose: Secondary | ICD-10-CM | POA: Diagnosis not present

## 2022-07-11 ENCOUNTER — Telehealth: Payer: Self-pay | Admitting: Family Medicine

## 2022-07-11 NOTE — Telephone Encounter (Signed)
Patient called and saif that [facility] needs order ultra sound of left breast.

## 2022-07-14 ENCOUNTER — Other Ambulatory Visit: Payer: Self-pay | Admitting: Family Medicine

## 2022-07-14 DIAGNOSIS — M79622 Pain in left upper arm: Secondary | ICD-10-CM

## 2022-07-14 NOTE — Telephone Encounter (Signed)
Pt called stating that [facility] does not have the bilateral

## 2022-07-14 NOTE — Telephone Encounter (Signed)
Called Patient to let her know that the ultrasound of her left breast has been ordered.

## 2022-07-14 NOTE — Progress Notes (Unsigned)
PCP: Dana Allan, MD   No chief complaint on file.   HPI:      [name] is a 62 y.o. [phone] who LMP was No LMP recorded. Patient is postmenopausal., presents today for her annual examination.  Her menses are absent due to menopause. She does not have PMB. She does not have vasomotor sx.   Sex activity: Rarely sex active. She does not have vaginal dryness.  Last Pap: 03/25/18  Results were: no abnormalities /neg HPV DNA.  Hx of STDs: none  Last mammogram: 08/12/21  Results were: normal--routine follow-up in 12 months There is a FH of breast cancer in her mat niece, genetic testing not indicated for pt. Pt unsure if niece had genetic testing. There is no FH of ovarian cancer. The patient does do self-breast exams.  Colonoscopy: colonoscopy 1/21 at [facility] without abnormalities.  Repeat due after 10 years per pt  Tobacco use: The patient denies current or previous tobacco use. Alcohol use: none  No drug use Exercise: min active  She does get adequate calcium and Vitamin D in her diet.  Labs with PCP.  Hx of mild heart murmur. Saw [facility] in past with eval, "murmur was mild". Now having some SOB at rest. No chest pain.    Past Medical History:  Diagnosis Date   Arthritis    COVID-19    10/2020   GERD (gastroesophageal reflux disease)    Hypertension    Joint pain    Leukocytosis 09/26/2020   New dx   Mastodynia    Sinus problem     Past Surgical History:  Procedure Laterality Date   COLONOSCOPY  2011, 2021   ESOPHAGOGASTRODUODENOSCOPY N/A 08/14/2014   Procedure: ESOPHAGOGASTRODUODENOSCOPY (EGD);  Surgeon: [name], MD;  Location: [facility] ENDOSCOPY;  Service: Gastroenterology;  Laterality: N/A;    Family History  Problem Relation Age of Onset   Hypertension Mother    Liver disease Mother        nash with cirrhosis   Other Mother        brain tumor    Prostate cancer Father 44   Arthritis/Rheumatoid Sister    Diabetes Brother    Heart attack Maternal  Grandmother    Lupus Maternal Aunt    Breast cancer Other        55s, has contact    Social History   Socioeconomic History   Marital status: Married    Spouse name: Not on file   Number of children: 2   Years of education: Not on file   Highest education level: Bachelor's degree (e.g., BA, AB, BS)  Occupational History   Not on file  Tobacco Use   Smoking status: Never   Smokeless tobacco: Never  Vaping Use   Vaping Use: Never used  Substance and Sexual Activity   Alcohol use: Never   Drug use: Never   Sexual activity: Yes    Birth control/protection: Post-menopausal  Other Topics Concern   Not on file  Social History Narrative   Married.   2 children. 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    Lives at home with spouse & children   Left handed   Caffeine: no   Social Determinants of Health   Financial Resource Strain: Low Risk  (06/04/2022)   Overall Financial Resource Strain (CARDIA)    Difficulty of Paying Living  Expenses: Not hard at all  Food Insecurity: No Food Insecurity (06/04/2022)   Hunger Vital Sign    Worried About Running Out of Food in the Last Year: Never true    Ran Out of Food in the Last Year: Never true  Transportation Needs: No Transportation Needs (06/04/2022)   PRAPARE - Administrator, Civil Service (Medical): No    Lack of Transportation (Non-Medical): No  Physical Activity: Insufficiently Active (06/04/2022)   Exercise Vital Sign    Days of Exercise per Week: 3 days    Minutes of Exercise per Session: 10 min  Stress: No Stress Concern Present (06/04/2022)   [employer] of Occupational Health - Occupational Stress Questionnaire    Feeling of Stress : Only a little  Social Connections: Socially Integrated (06/04/2022)   Social Connection and Isolation Panel [NHANES]    Frequency of Communication with Friends and Family: More than three times a  week    Frequency of Social Gatherings with Friends and Family: Once a week    Attends Religious Services: More than 4 times per year    Active Member of [employer] or Organizations: Yes    Attends Banker Meetings: 1 to 4 times per year    Marital Status: Married  Catering manager Violence: Not At Risk (06/04/2022)   Humiliation, Afraid, Rape, and Kick questionnaire    Fear of Current or Ex-Partner: No    Emotionally Abused: No    Physically Abused: No    Sexually Abused: No    No outpatient medications have been marked as taking for the 07/15/22 encounter (Appointment) with Destin Vinsant, Ilona Sorrel, PA-C.      ROS:  Review of Systems  Constitutional:  Negative for fatigue, fever and unexpected weight change.  Respiratory:  Positive for shortness of breath. Negative for cough and wheezing.   Cardiovascular:  Negative for chest pain, palpitations and leg swelling.  Gastrointestinal:  Negative for blood in stool, constipation, diarrhea, nausea and vomiting.  Endocrine: Negative for cold intolerance, heat intolerance and polyuria.  Genitourinary:  Negative for dyspareunia, dysuria, flank pain, frequency, genital sores, hematuria, menstrual problem, pelvic pain, urgency, vaginal bleeding, vaginal discharge and vaginal pain.  Musculoskeletal:  Positive for arthralgias. Negative for back pain, joint swelling and myalgias.  Skin:  Negative for rash.  Neurological:  Negative for dizziness, syncope, light-headedness, numbness and headaches.  Hematological:  Negative for adenopathy.  Psychiatric/Behavioral:  Negative for agitation, confusion, sleep disturbance and suicidal ideas. The patient is not nervous/anxious.      Objective: There were no vitals taken for this visit.   Physical Exam Constitutional:      Appearance: She is well-developed.  Genitourinary:     Vulva normal.     Right Labia: No rash, tenderness or lesions.    Left Labia: No tenderness, lesions or rash.    No  vaginal discharge, erythema or tenderness.      Right Adnexa: not tender and no mass present.    Left Adnexa: not tender and no mass present.    No cervical friability or polyp.     Uterus is not enlarged or tender.  Breasts:    Right: No mass, nipple discharge, skin change or tenderness.     Left: No mass, nipple discharge, skin change or tenderness.  Neck:     Thyroid: No thyromegaly.  Cardiovascular:     Rate and Rhythm: Normal rate and regular rhythm.     Heart sounds: Murmur heard.  Systolic murmur is present with a grade of 2/6.     No diastolic murmur is present.  Pulmonary:     Effort: Pulmonary effort is normal.     Breath sounds: Normal breath sounds.  Abdominal:     Palpations: Abdomen is soft.     Tenderness: There is no abdominal tenderness. There is no guarding or rebound.  Musculoskeletal:        General: Normal range of motion.     Cervical back: Normal range of motion.  Lymphadenopathy:     Cervical: No cervical adenopathy.  Neurological:     General: No focal deficit present.     Mental Status: She is alert and oriented to person, place, and time.     Cranial Nerves: No cranial nerve deficit.  Skin:    General: Skin is warm and dry.  Psychiatric:        Mood and Affect: Mood normal.        Behavior: Behavior normal.        Thought Content: Thought content normal.        Judgment: Judgment normal.  Vitals reviewed.    Assessment/Plan: Encounter for annual routine gynecological examination  Encounter for screening mammogram for malignant neoplasm of breast; pt has appt  Heart murmur - Plan: Ambulatory referral to Cardiology; refer to [facility] cardio for eval.   Shortness of breath - Plan: Ambulatory referral to Cardiology          GYN counsel breast self exam, mammography screening, menopause, adequate intake of calcium and vitamin D, diet and exercise    F/U  No follow-ups on file.  Royston Bekele B. Cherrell Maybee, PA-C 07/14/2022 5:08 PM

## 2022-07-15 ENCOUNTER — Encounter: Payer: Self-pay | Admitting: Obstetrics and Gynecology

## 2022-07-15 ENCOUNTER — Other Ambulatory Visit (HOSPITAL_COMMUNITY)
Admission: RE | Admit: 2022-07-15 | Discharge: 2022-07-15 | Disposition: A | Discharge status: Home / Self Care | Payer: BC Managed Care – PPO | Source: Ambulatory Visit | Attending: Obstetrics and Gynecology | Admitting: Obstetrics and Gynecology

## 2022-07-15 ENCOUNTER — Ambulatory Visit (INDEPENDENT_AMBULATORY_CARE_PROVIDER_SITE_OTHER): Payer: BC Managed Care – PPO | Admitting: Obstetrics and Gynecology

## 2022-07-15 VITALS — BP 118/70 | Ht 59.0 in | Wt 134.0 lb

## 2022-07-15 DIAGNOSIS — Z1151 Encounter for screening for human papillomavirus (HPV): Secondary | ICD-10-CM | POA: Diagnosis not present

## 2022-07-15 DIAGNOSIS — Z124 Encounter for screening for malignant neoplasm of cervix: Secondary | ICD-10-CM | POA: Diagnosis not present

## 2022-07-15 DIAGNOSIS — N644 Mastodynia: Secondary | ICD-10-CM

## 2022-07-15 DIAGNOSIS — Z01419 Encounter for gynecological examination (general) (routine) without abnormal findings: Secondary | ICD-10-CM

## 2022-07-15 DIAGNOSIS — Z1231 Encounter for screening mammogram for malignant neoplasm of breast: Secondary | ICD-10-CM

## 2022-07-15 NOTE — Patient Instructions (Addendum)
I value your feedback and you entrusting us with your care. If you get a [facility] patient survey, I would appreciate you taking the time to let us know about your experience today. Thank you!  [facility] ([place])--[phone]  

## 2022-07-15 NOTE — Telephone Encounter (Signed)
Patient states Susan Allison said the order was not in so her OB-GYN put the orders in this morning and requested that Dr. Clent Ridges be added to the results.

## 2022-07-16 ENCOUNTER — Ambulatory Visit
Admission: RE | Admit: 2022-07-16 | Discharge: 2022-07-16 | Disposition: A | Discharge status: Home / Self Care | Payer: BC Managed Care – PPO | Source: Ambulatory Visit | Attending: Obstetrics and Gynecology | Admitting: Obstetrics and Gynecology

## 2022-07-16 ENCOUNTER — Encounter: Payer: Self-pay | Admitting: Family Medicine

## 2022-07-16 ENCOUNTER — Other Ambulatory Visit: Payer: Self-pay | Admitting: Obstetrics and Gynecology

## 2022-07-16 DIAGNOSIS — Z124 Encounter for screening for malignant neoplasm of cervix: Secondary | ICD-10-CM

## 2022-07-16 DIAGNOSIS — N644 Mastodynia: Secondary | ICD-10-CM

## 2022-07-16 DIAGNOSIS — Z1231 Encounter for screening mammogram for malignant neoplasm of breast: Secondary | ICD-10-CM

## 2022-07-16 DIAGNOSIS — Z01419 Encounter for gynecological examination (general) (routine) without abnormal findings: Secondary | ICD-10-CM

## 2022-07-16 DIAGNOSIS — N6489 Other specified disorders of breast: Secondary | ICD-10-CM | POA: Diagnosis not present

## 2022-07-16 DIAGNOSIS — Z1151 Encounter for screening for human papillomavirus (HPV): Secondary | ICD-10-CM

## 2022-07-16 DIAGNOSIS — R92323 Mammographic fibroglandular density, bilateral breasts: Secondary | ICD-10-CM | POA: Diagnosis not present

## 2022-07-16 LAB — CYTOLOGY - PAP
Comment: NEGATIVE
Diagnosis: NEGATIVE
High risk HPV: NEGATIVE

## 2022-07-28 DIAGNOSIS — R7309 Other abnormal glucose: Secondary | ICD-10-CM | POA: Diagnosis not present

## 2022-08-28 DIAGNOSIS — R7309 Other abnormal glucose: Secondary | ICD-10-CM | POA: Diagnosis not present

## 2022-09-02 ENCOUNTER — Other Ambulatory Visit: Payer: Self-pay

## 2022-09-02 ENCOUNTER — Ambulatory Visit (INDEPENDENT_AMBULATORY_CARE_PROVIDER_SITE_OTHER): Payer: Self-pay | Admitting: Physician Assistant

## 2022-09-02 ENCOUNTER — Encounter: Payer: Self-pay | Admitting: Physician Assistant

## 2022-09-02 VITALS — BP 132/68 | HR 103 | Temp 98.4°F

## 2022-09-02 DIAGNOSIS — U071 COVID-19: Secondary | ICD-10-CM

## 2022-09-02 DIAGNOSIS — R52 Pain, unspecified: Secondary | ICD-10-CM

## 2022-09-02 LAB — POC COVID19 BINAXNOW: SARS Coronavirus 2 Ag: POSITIVE — AB

## 2022-09-02 NOTE — Progress Notes (Signed)
[facility] [address] [place], [place] [address]   Office Visit Note  Patient Name: Susan Allison Date of Birth [id number]  Medical Record number [id number]  Date of Service: 09/02/2022  Chief Complaint  Patient presents with   Nasal Congestion    Started over the weekend with body aches, she states she had a mild temperature and some congestion, she states she is feeling some better today but wants to make sure she does not have a sinus infection      62 y/o F presents to the clinic for c/o body aches, fever, nasal congestion x 2 days. Max temp. Of 100.6 degrees F last night. She denies CP, SOB. Pt denies known exposure to covid. No family members with similar symptoms. Has been taking OTC Tylenol for her symptoms. Does admit to mild improvement today than yesterday.       Current Medication:  Outpatient Encounter Medications as of 09/02/2022  Medication Sig   amLODipine (NORVASC) 5 MG tablet Take 1 tablet (5 mg total) by mouth daily at 6 PM. D/c 2.5   azelastine (ASTELIN) 0.1 % nasal spray Place 2 sprays into both nostrils as needed. Use in each nostril as directed   calcium carbonate (OS-CAL) 600 MG TABS tablet Take 600 mg by mouth daily.    cetirizine (ZYRTEC) 10 MG tablet Take 10 mg by mouth as needed.    Cyanocobalamin (VITAMIN B-12 PO) Take by mouth daily at 12 noon.   fluticasone (FLONASE) 50 MCG/ACT nasal spray Place 1-2 sprays into both nostrils daily.   levothyroxine (SYNTHROID) 50 MCG tablet TAKE ONE TABLET BY MOUTH EVERY MORNING 30 MINUTES BEFORE BREAKFAST   metoprolol tartrate (LOPRESSOR) 25 MG tablet Take 1 tablet (25 mg total) by mouth 2 (two) times daily as needed (HR>100 for more than 20 minutes). (Patient not taking: Reported on 06/04/2022)   Vitamin D, Ergocalciferol, (DRISDOL) 1.25 MG (50000 UNIT) CAPS capsule Take 1 capsule (50,000 Units total) by mouth every 7 (seven) days.   No facility-administered encounter medications on file as of 09/02/2022.       Medical History: Past Medical History:  Diagnosis Date   Arthritis    COVID-19    10/2020   GERD (gastroesophageal reflux disease)    Hypertension    Joint pain    Leukocytosis 09/26/2020   New dx   Mastodynia    Sinus problem      Vital Signs: BP 132/68   Pulse (!) 103   Temp 98.4 F (36.9 C) (Tympanic)   SpO2 96%    Review of Systems  Constitutional:  Positive for fever.  HENT:  Positive for congestion and sore throat. Negative for postnasal drip, sinus pressure, sinus pain and trouble swallowing.   Eyes: Negative.   Respiratory: Negative.    Cardiovascular: Negative.   Neurological: Negative.     Physical Exam Constitutional:      Appearance: Normal appearance.  HENT:     Head: Atraumatic.     Right Ear: Ear canal and external ear normal.     Left Ear: Ear canal and external ear normal.     Ears:     Comments: Multiple air-fluid levels present in both ears.     Nose: Nose normal.     Mouth/Throat:     Mouth: Mucous membranes are moist.     Pharynx: Oropharynx is clear.  Eyes:     Extraocular Movements: Extraocular movements intact.  Cardiovascular:     Rate and  Rhythm: Normal rate and regular rhythm.  Pulmonary:     Effort: Pulmonary effort is normal.     Breath sounds: Normal breath sounds.  Musculoskeletal:     Cervical back: Neck supple.  Skin:    General: Skin is warm.  Neurological:     Mental Status: She is alert.  Psychiatric:        Mood and Affect: Mood normal.        Behavior: Behavior normal.        Thought Content: Thought content normal.        Judgment: Judgment normal.       Assessment/Plan:  1. COVID-19 virus infection  2. Body aches - POC COVID-19  Reviewed POSITIVE covid test result with patient. She verbalized understanding. Increase fluids REST Take otc Tylenol cough and sinus medicine as directed at the clinic.  Reviewed Covid precautions with patient. She verbalized understanding. Reviewed CDC  guidelines with patient. Pt will be isolated for 5 days from the start of her symptoms. Needs to be fever free for 24 hours without the aid of medicine before returning back to work. Must wear a mask for an additional 5 days when at workplace.    General Counseling: herta leuenberger understanding of the findings of todays visit and agrees with plan of treatment. I have discussed any further diagnostic evaluation that may be needed or ordered today. We also reviewed her medications today. she has been encouraged to call the office with any questions or concerns that should arise related to todays visit.    Time spent:20 Minutes    Gilberto Better, [place] Physician Assistant

## 2022-09-17 ENCOUNTER — Other Ambulatory Visit: Payer: Self-pay | Admitting: Adult Health

## 2022-09-17 DIAGNOSIS — E039 Hypothyroidism, unspecified: Secondary | ICD-10-CM

## 2022-09-17 DIAGNOSIS — I10 Essential (primary) hypertension: Secondary | ICD-10-CM

## 2022-09-18 DIAGNOSIS — H6505 Acute serous otitis media, recurrent, left ear: Secondary | ICD-10-CM | POA: Diagnosis not present

## 2022-09-18 DIAGNOSIS — H6982 Other specified disorders of Eustachian tube, left ear: Secondary | ICD-10-CM | POA: Diagnosis not present

## 2022-09-23 ENCOUNTER — Other Ambulatory Visit: Payer: Self-pay | Admitting: Adult Health

## 2022-09-23 DIAGNOSIS — I10 Essential (primary) hypertension: Secondary | ICD-10-CM

## 2022-09-23 DIAGNOSIS — E039 Hypothyroidism, unspecified: Secondary | ICD-10-CM

## 2022-09-24 IMAGING — MR MR LUMBAR SPINE W/O CM
4 of 5 series · 27 of 48 positions shown · non-contrast
Comparison: Previous radiograph from 08/04/2018.

CLINICAL DATA: Initial evaluation for chronic lower back pain with
radiation into the right buttock, bilateral leg weakness, numbness
in feet.

EXAM:
MRI LUMBAR SPINE WITHOUT CONTRAST
TECHNIQUE: Multiplanar, multisequence MR imaging of the lumbar spine was
performed. No intravenous contrast was administered.

[Series 3: T2 · sagittal · 4.0mm · 1.09mm/px · 6 of 17 slices shown (1 of 2)]
[im 1/17]
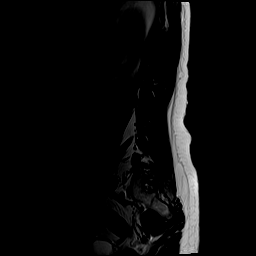
[im 4/17]
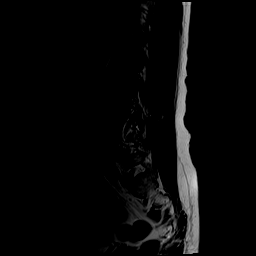
[im 7/17]
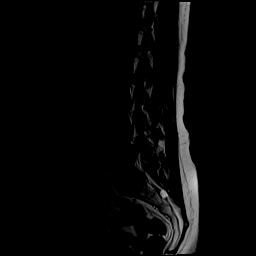
[im 10/17]
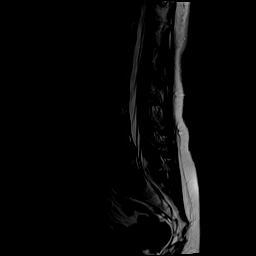
[im 13/17]
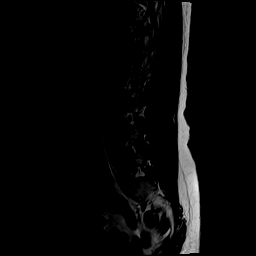
[im 17/17]
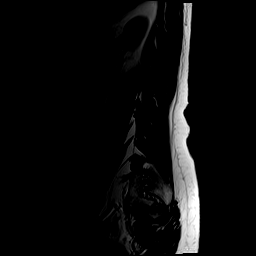

[Series 5: T1 · sagittal · 4.0mm · 1.09mm/px · 7 of 17 slices shown (1 of 2)]
[im 1/17]
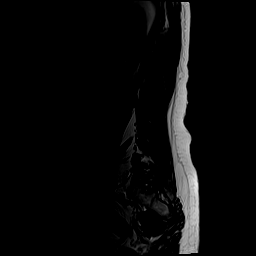
[im 3/17]
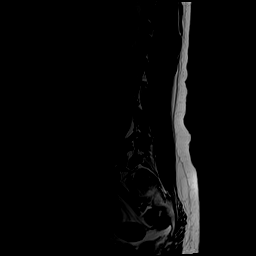
[im 6/17]
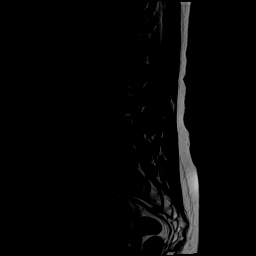
[im 9/17]
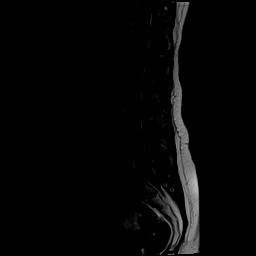
[im 11/17]
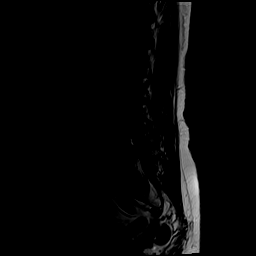
[im 14/17]
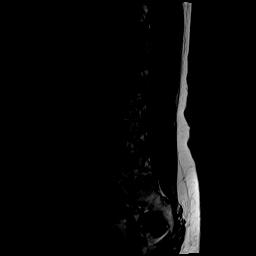
[im 17/17]
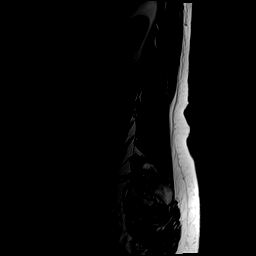

[Series 6: T2 · axial · 4.0mm · 0.39mm/px · z∈[-474,-282]mm · 8 of 36 slices shown (2 of 2)]
[im 1/36]
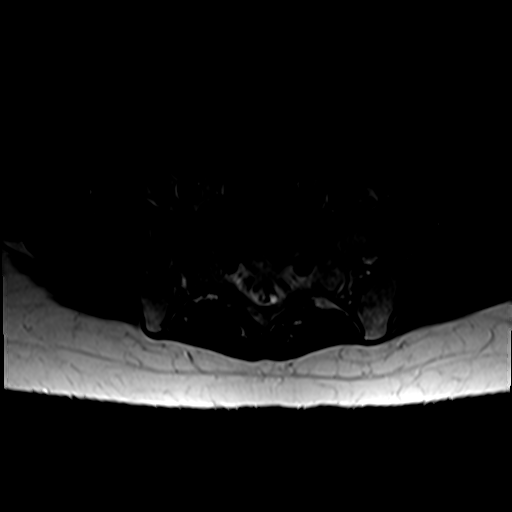
[im 6/36]
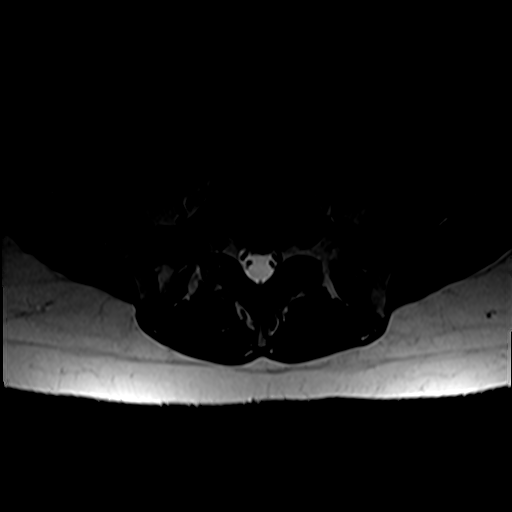
[im 11/36]
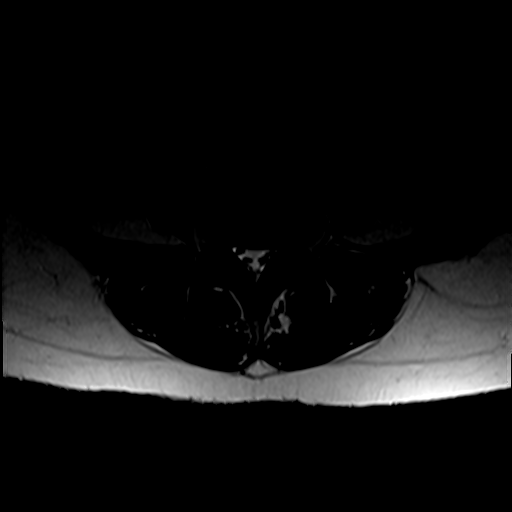
[im 17/36]
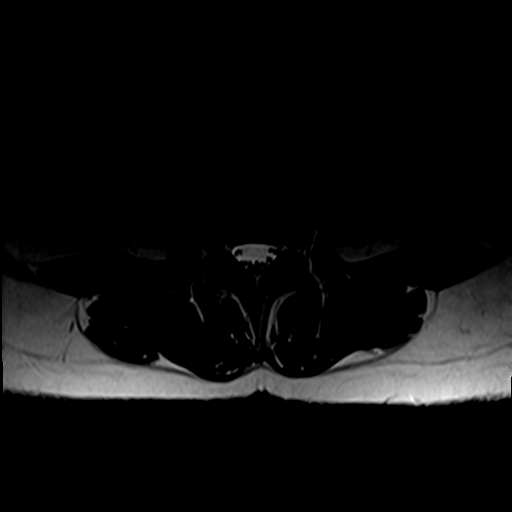
[im 19/36]
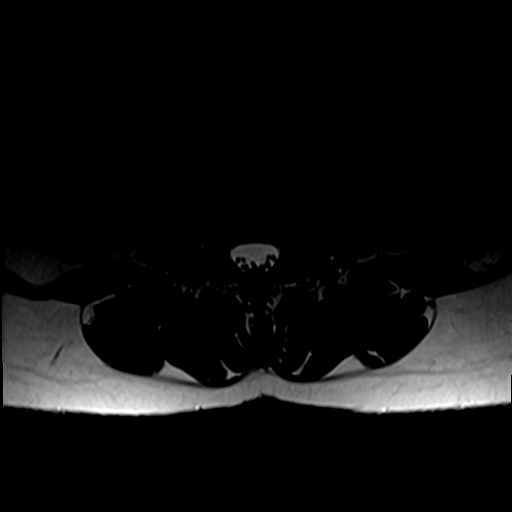
[im 25/36]
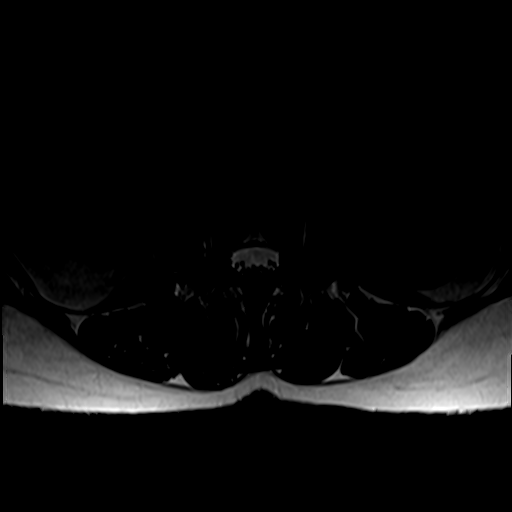
[im 30/36]
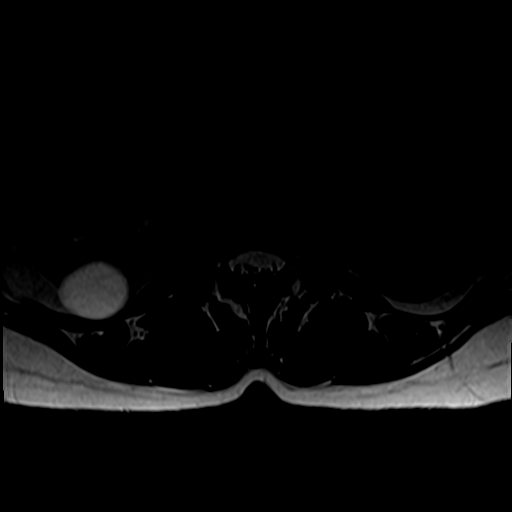
[im 36/36]
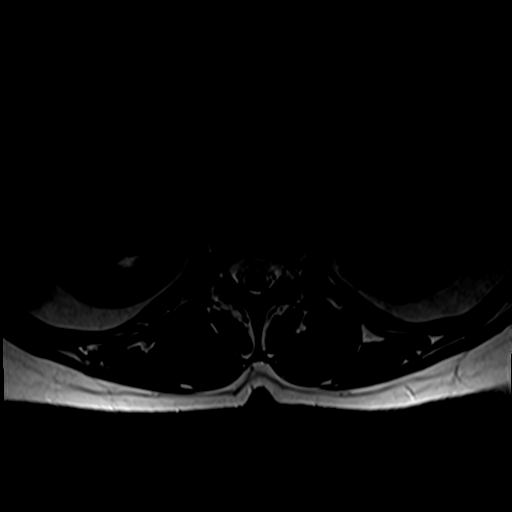

[Series 7: T1 · axial · 4.0mm · 0.39mm/px · z∈[-474,-310]mm · 6 of 36 slices shown (2 of 2)]
[im 1/36]
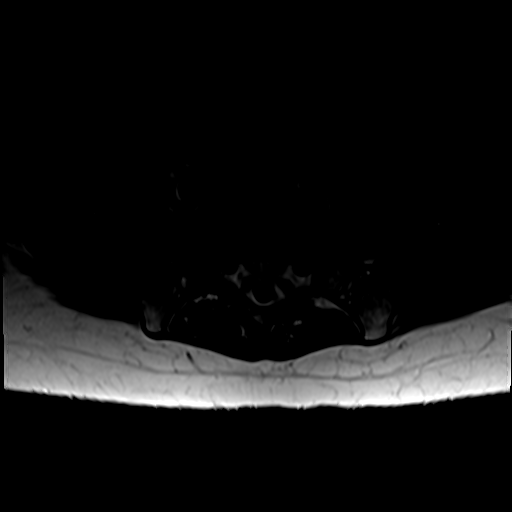
[im 6/36]
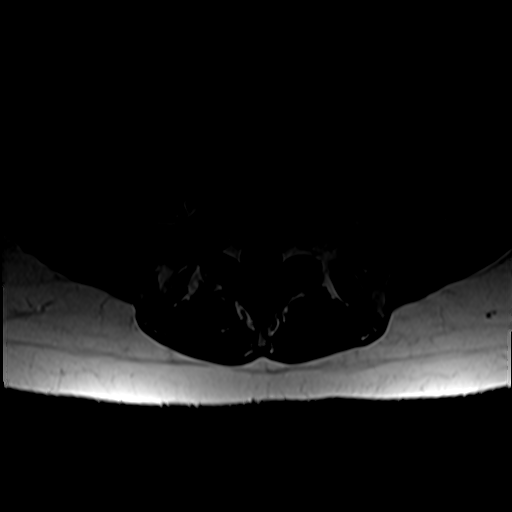
[im 11/36]
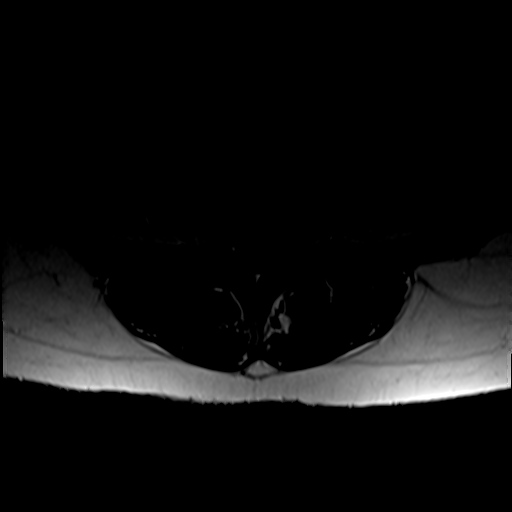
[im 17/36]
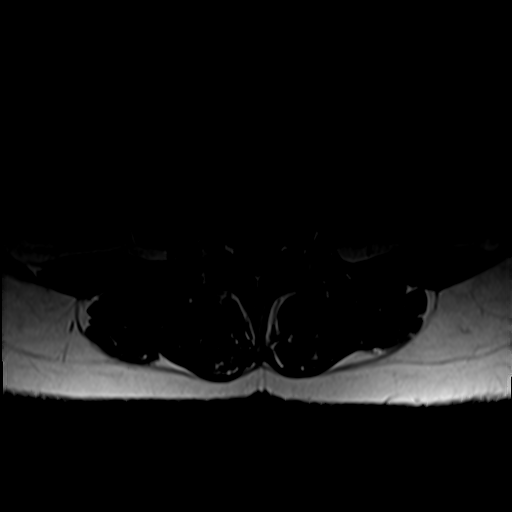
[im 19/36]
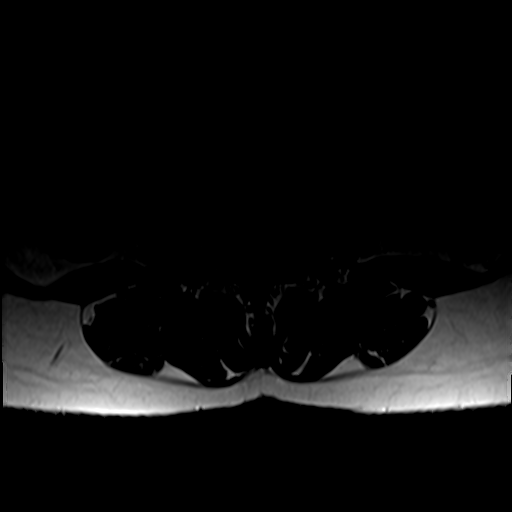
[im 30/36]
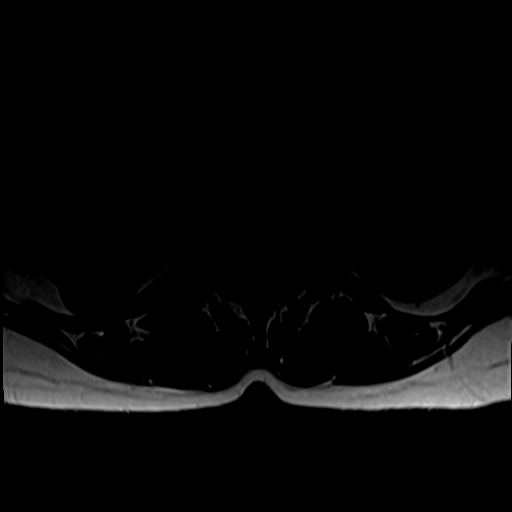

[27 of 48 positions shown; findings below may reference images not displayed]

FINDINGS: Segmentation: Standard. Lowest well-formed disc space labeled the
L5-S1 level.

Alignment: Physiologic with preservation of the normal lumbar
lordosis. No listhesis.

Vertebrae: Vertebral body height maintained without evidence for
acute or chronic fracture. Bone marrow signal intensity within
normal limits. No discrete or worrisome osseous lesions. Discogenic
reactive endplate change with mild marrow edema present about the
L4-5 interspace. No other abnormal marrow edema.

Conus medullaris and cauda equina: Conus extends to the L1 level.
Conus and cauda equina appear normal.

Paraspinal and other soft tissues: Paraspinous soft tissues within
normal limits. Few scattered benign appearing cyst noted about the
right kidney, largest of which measures 3 cm in size. Visualized
visceral structures otherwise unremarkable.

Disc levels:

L1-2:  Unremarkable.

L2-3: Disc desiccation with mild disc bulge. Superimposed subtle
right extraforaminal disc protrusion closely approximates the
exiting right L2 nerve root without frank impingement or
displacement (series 6, image 15). No spinal stenosis. Foramina
remain patent.

L3-4: Normal interspace. Minimal facet hypertrophy. No canal or
foraminal stenosis.

L4-5: Diffuse disc bulge with disc desiccation and intervertebral
disc space narrowing. Reactive endplate change with mild marrow
edema. Superimposed shallow central disc protrusion with annular
fissure (series 6, image 27). Mild facet and ligament flavum
hypertrophy. Resultant mild canal with bilateral lateral recess
stenosis. Mild to moderate bilateral L4 foraminal narrowing. No
frank impingement.

L5-S1: Normal interspace. No significant facet degeneration. No
canal or lateral recess stenosis. Foramina remain patent.
IMPRESSION: 1. Disc bulge with facet hypertrophy at L4-5 with resultant mild
canal and bilateral lateral recess stenosis, with mild to moderate
bilateral L4 foraminal narrowing.
2. Subtle right extraforaminal disc protrusion at L2-3, closely
approximating and potentially irritating the exiting right L2 nerve
root.

## 2022-09-28 DIAGNOSIS — R7309 Other abnormal glucose: Secondary | ICD-10-CM | POA: Diagnosis not present

## 2022-10-10 DIAGNOSIS — I38 Endocarditis, valve unspecified: Secondary | ICD-10-CM | POA: Diagnosis not present

## 2022-10-10 DIAGNOSIS — I10 Essential (primary) hypertension: Secondary | ICD-10-CM | POA: Diagnosis not present

## 2022-10-10 DIAGNOSIS — I272 Pulmonary hypertension, unspecified: Secondary | ICD-10-CM | POA: Diagnosis not present

## 2022-10-10 DIAGNOSIS — R002 Palpitations: Secondary | ICD-10-CM | POA: Diagnosis not present

## 2022-10-20 DIAGNOSIS — H66002 Acute suppurative otitis media without spontaneous rupture of ear drum, left ear: Secondary | ICD-10-CM | POA: Diagnosis not present

## 2022-10-20 DIAGNOSIS — H6982 Other specified disorders of Eustachian tube, left ear: Secondary | ICD-10-CM | POA: Diagnosis not present

## 2022-10-28 DIAGNOSIS — R7309 Other abnormal glucose: Secondary | ICD-10-CM | POA: Diagnosis not present

## 2022-11-17 IMAGING — US US ABDOMEN COMPLETE
1 series · 14 of 25 positions shown · non-contrast
Comparison: 05/27/2017

CLINICAL DATA: Elevated liver enzymes.

EXAM:
ABDOMEN ULTRASOUND COMPLETE

[Series 1: us abdomen complete · 14 of 101 slices shown]
[im 1/101]
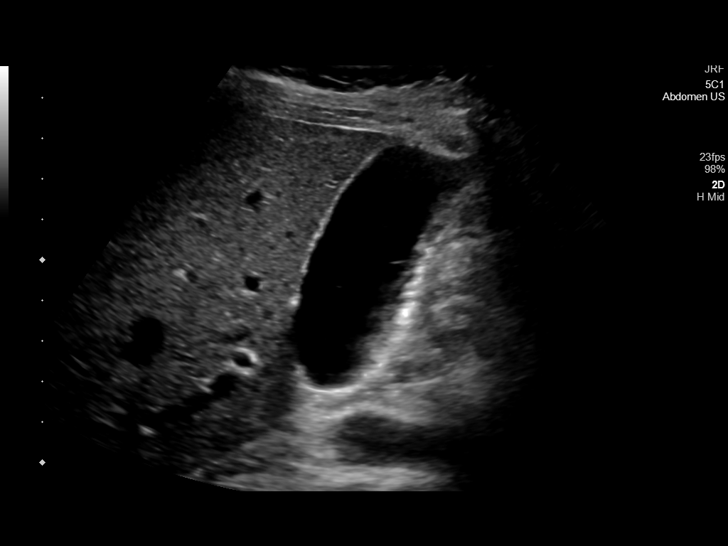
[im 9/101]
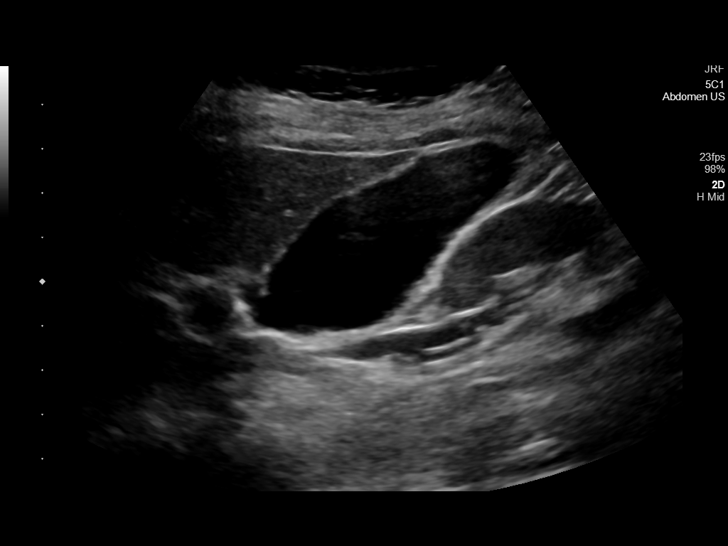
[im 17/101]
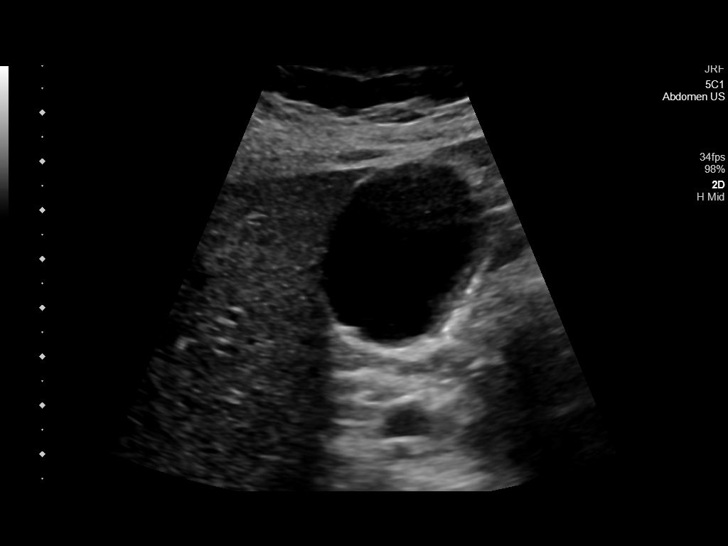
[im 26/101]
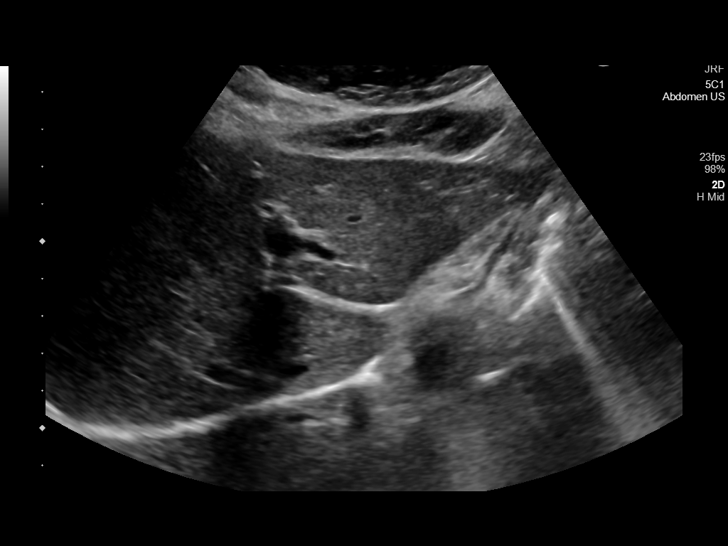
[im 34/101]
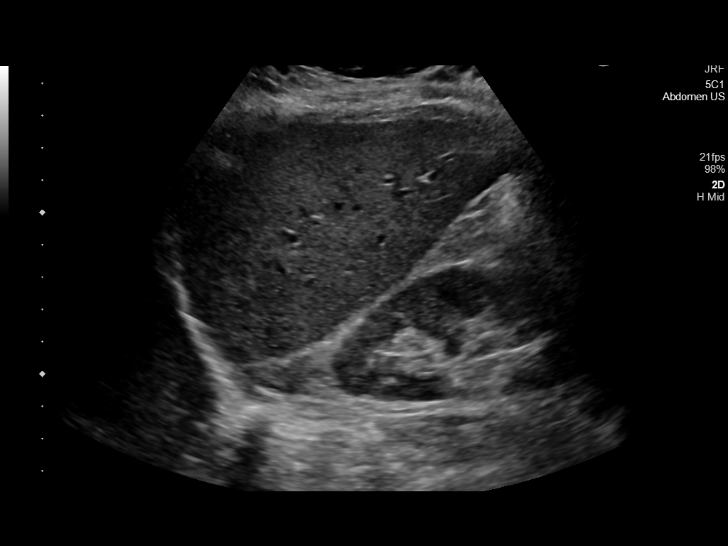
[im 38/101]
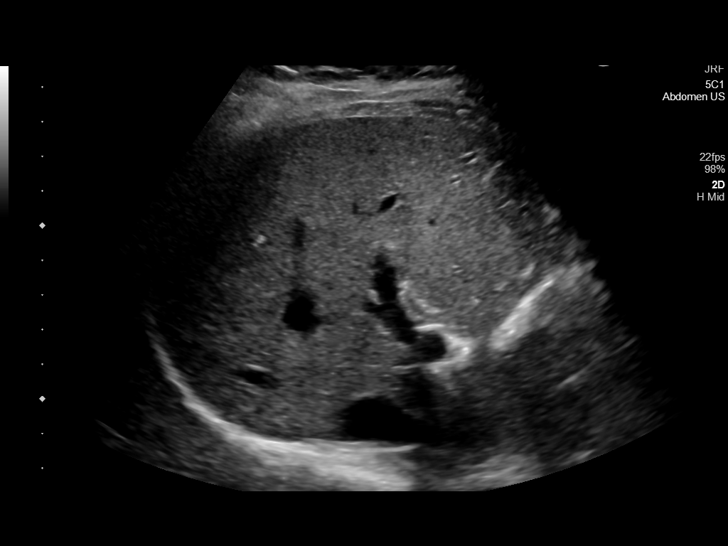
[im 46/101]
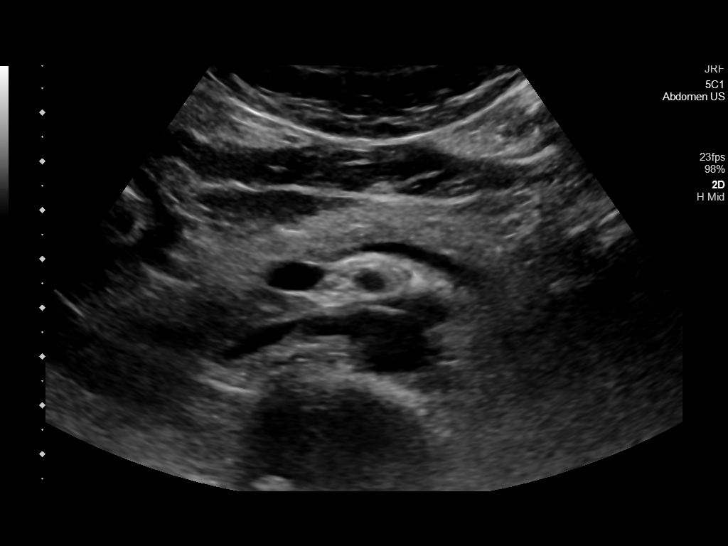
[im 55/101]
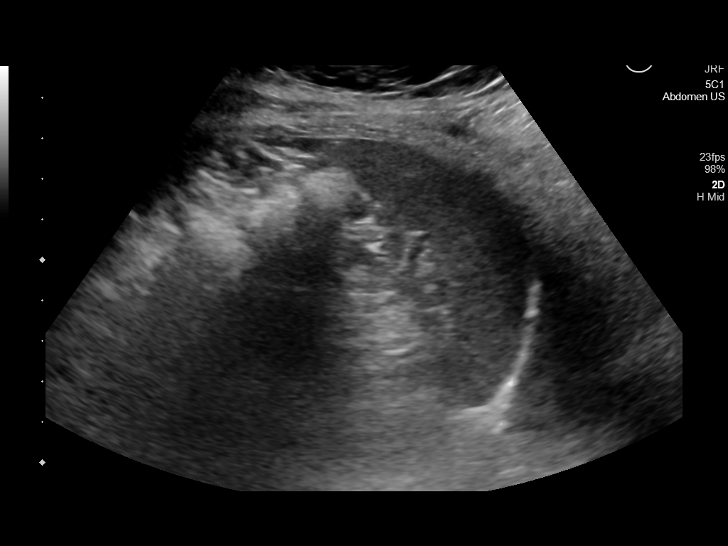
[im 63/101]
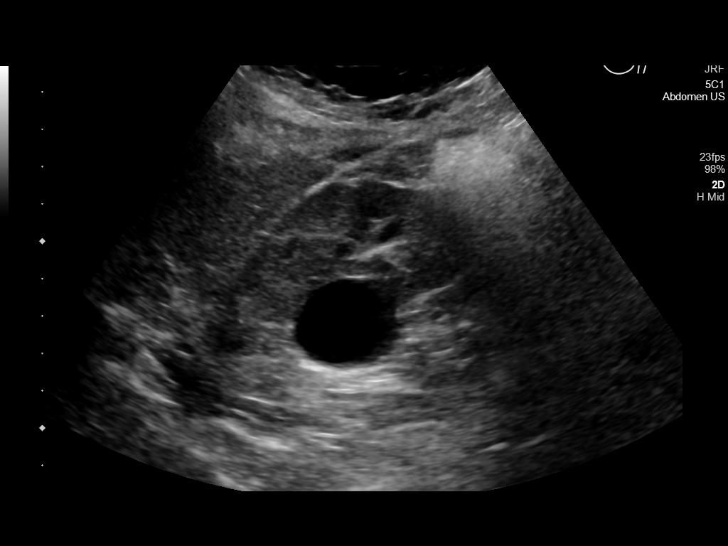
[im 67/101]
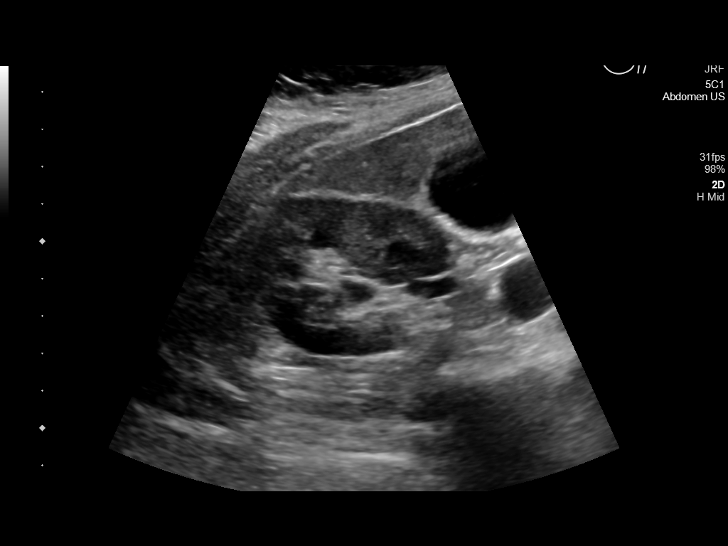
[im 76/101]
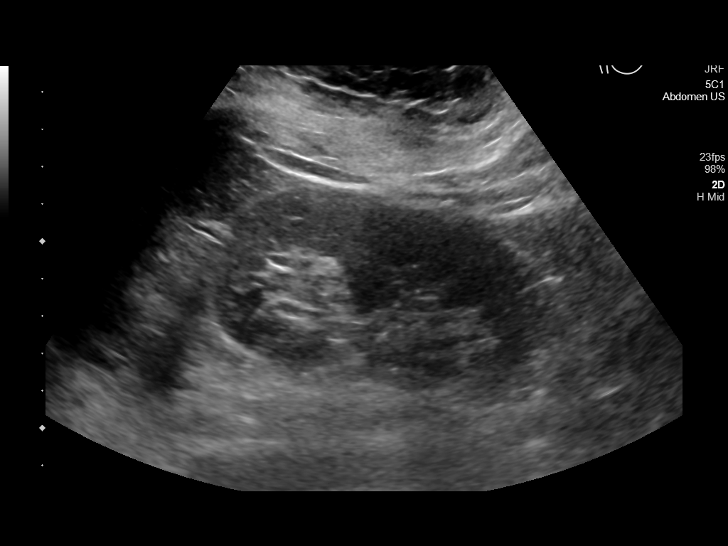
[im 84/101]
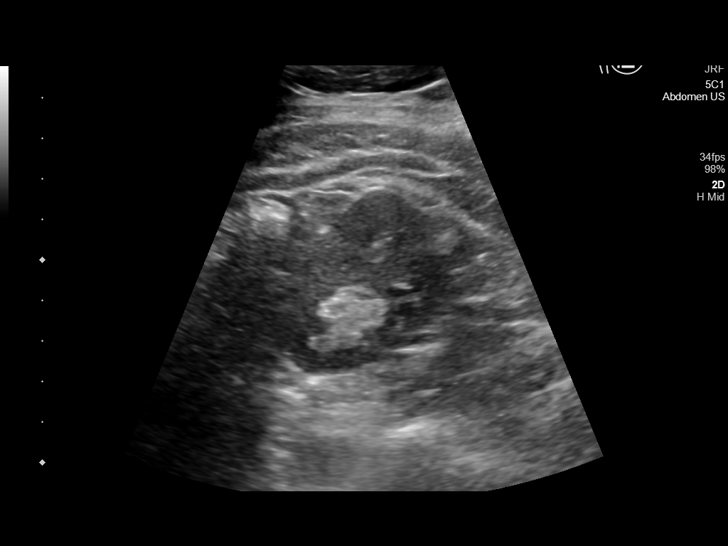
[im 92/101]
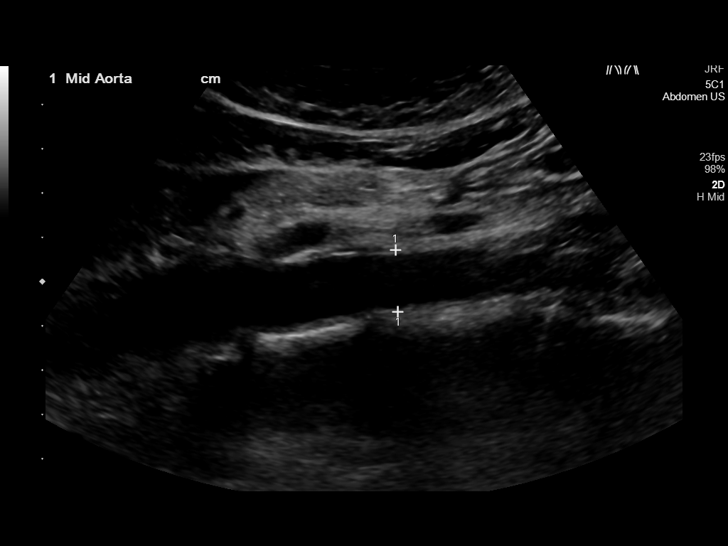
[im 101/101]
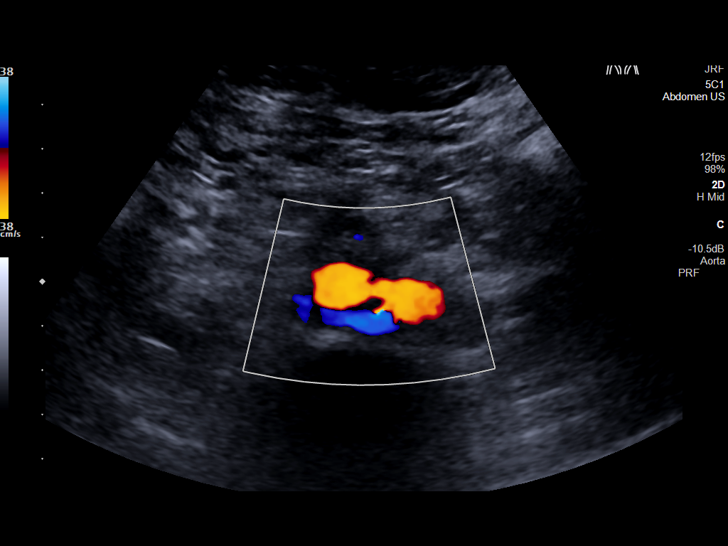

[14 of 25 positions shown; findings below may reference images not displayed]

FINDINGS: Gallbladder: Calcified gallstone identified measuring up to 1.1 cm.
No gallbladder wall thickening. No pericholecystic fluid. The
sonographer reports no sonographic Murphy's sign.

Common bile duct: Diameter: 2-3 mm

Liver: No focal lesion identified. Within normal limits in
parenchymal echogenicity. Portal vein is patent on color Doppler
imaging with normal direction of blood flow towards the liver.

IVC: No abnormality visualized.

Pancreas: Visualized portion unremarkable.

Spleen: Size and appearance within normal limits.

Right Kidney: Length: 9.3.  3.0 cm simple cyst.  No hydronephrosis.

Left Kidney: Length: 9.1 cm. Echogenicity within normal limits. No
mass or hydronephrosis visualized.

Abdominal aorta: No aneurysm visualized.

Other findings: None.
IMPRESSION: 1. Cholelithiasis.
2. 3.0 cm simple cyst right kidney

## 2022-11-17 IMAGING — US US THYROID
1 series · 14 of 25 positions shown · non-contrast
Comparison: None.

CLINICAL DATA: Abnormal thyroid function tests

EXAM:
THYROID ULTRASOUND
TECHNIQUE: Ultrasound examination of the thyroid gland and adjacent soft
tissues was performed.

[Series 1: us thyroid · 14 of 40 slices shown]
[im 1/40]
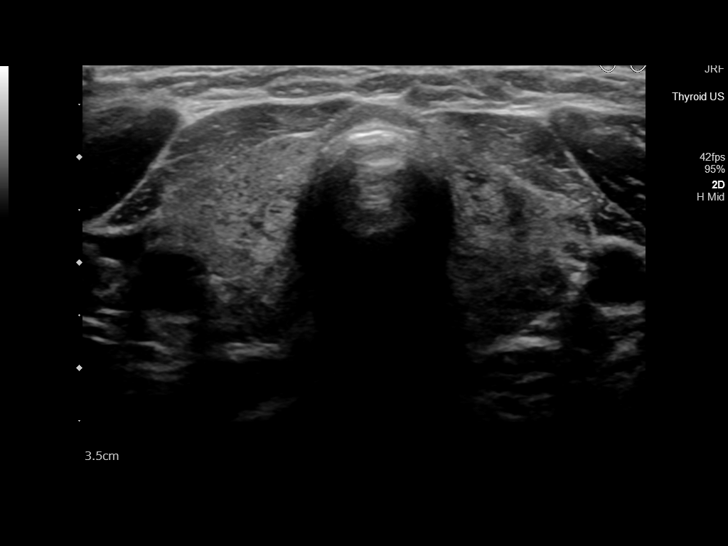
[im 4/40]
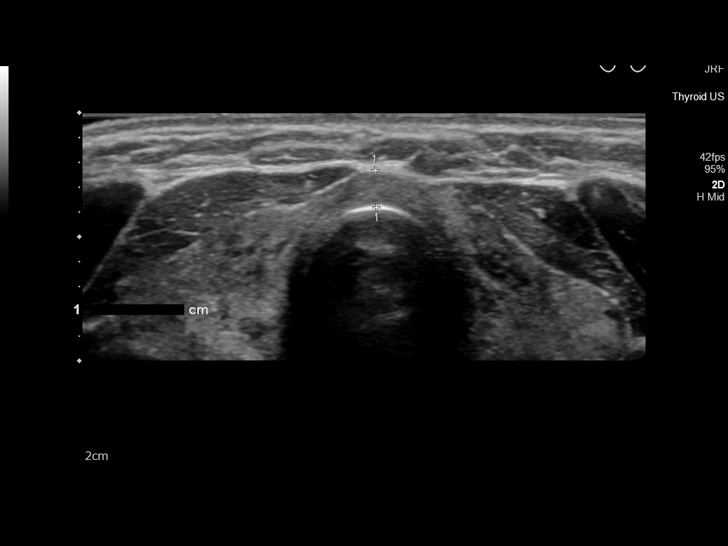
[im 7/40]
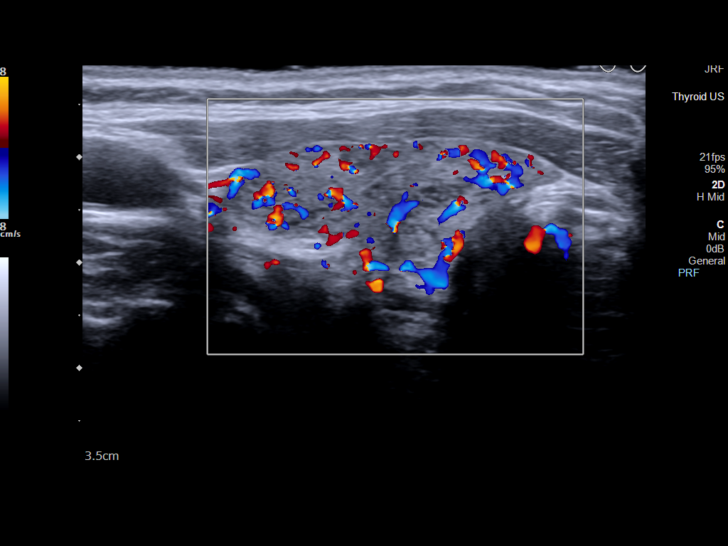
[im 10/40]
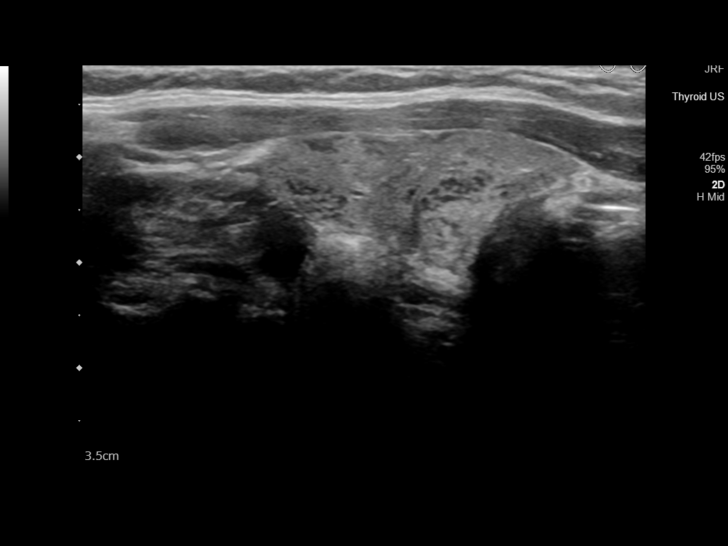
[im 14/40]
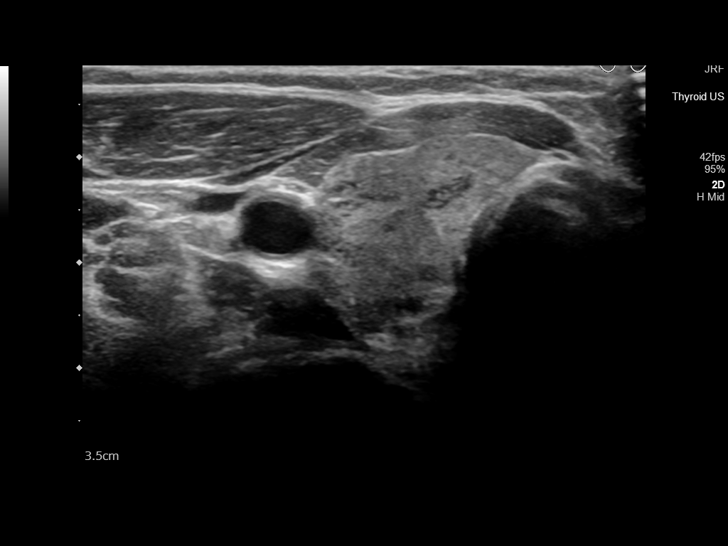
[im 15/40]
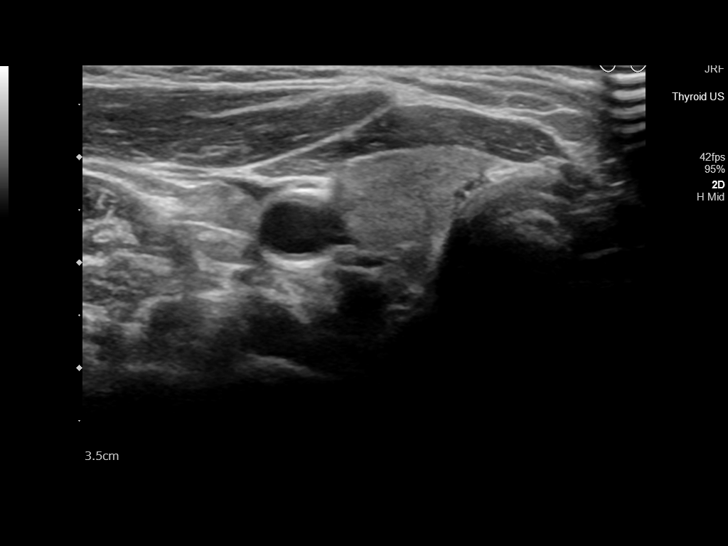
[im 18/40]
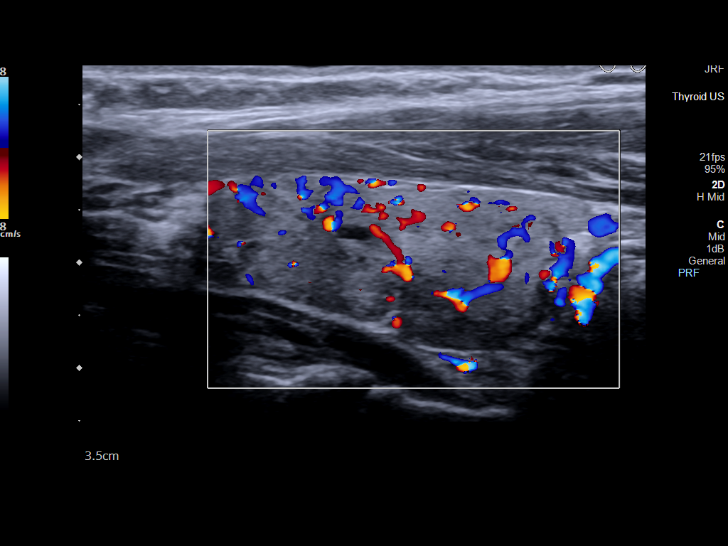
[im 22/40]
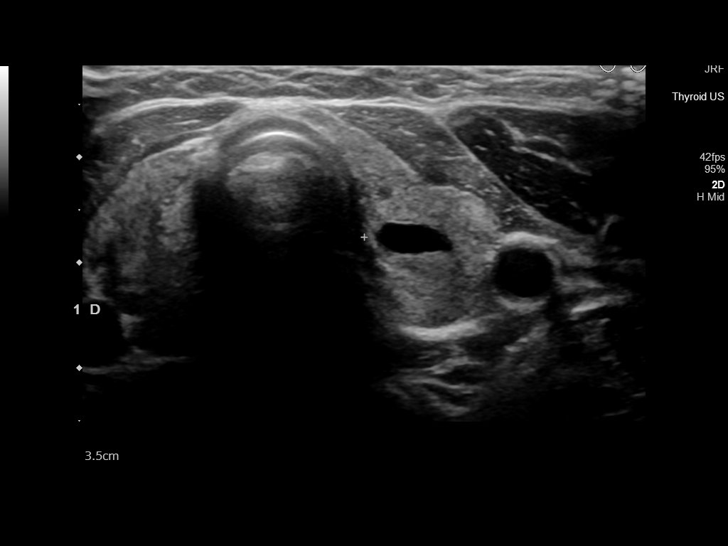
[im 25/40]
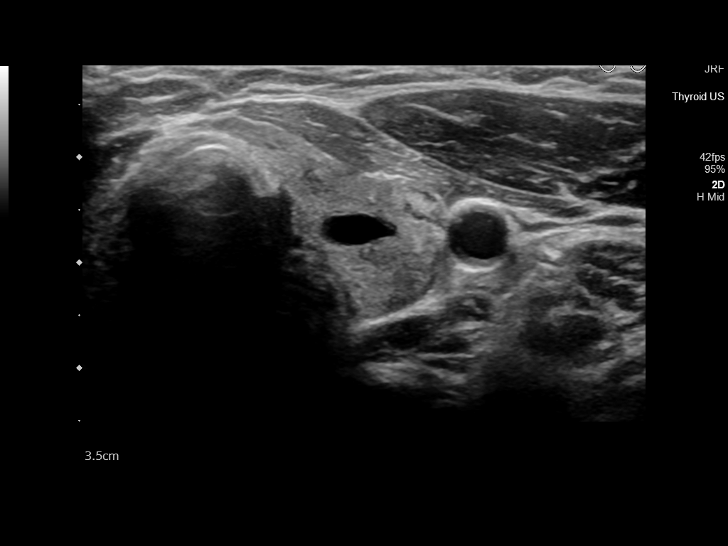
[im 27/40]
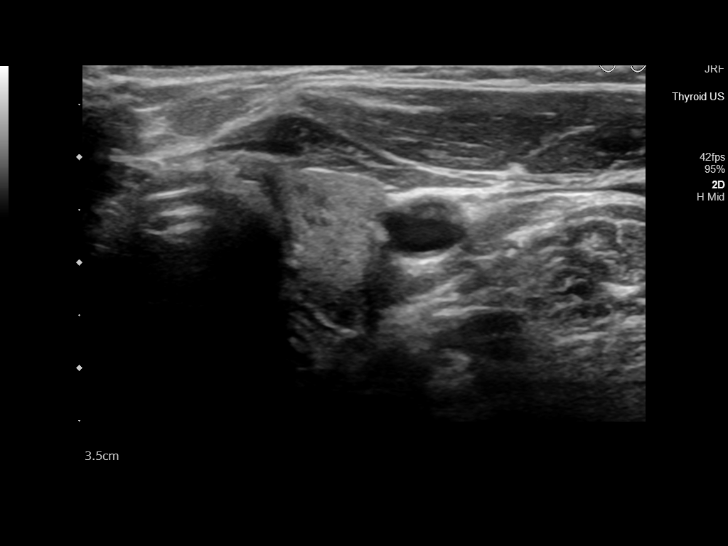
[im 30/40]
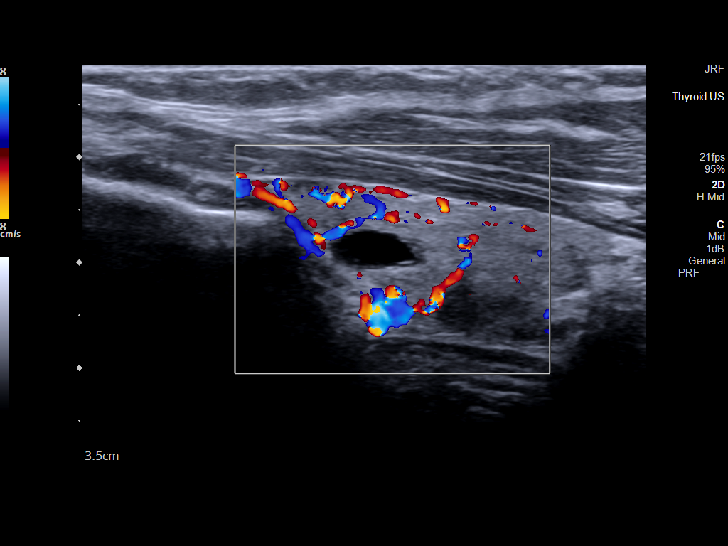
[im 33/40]
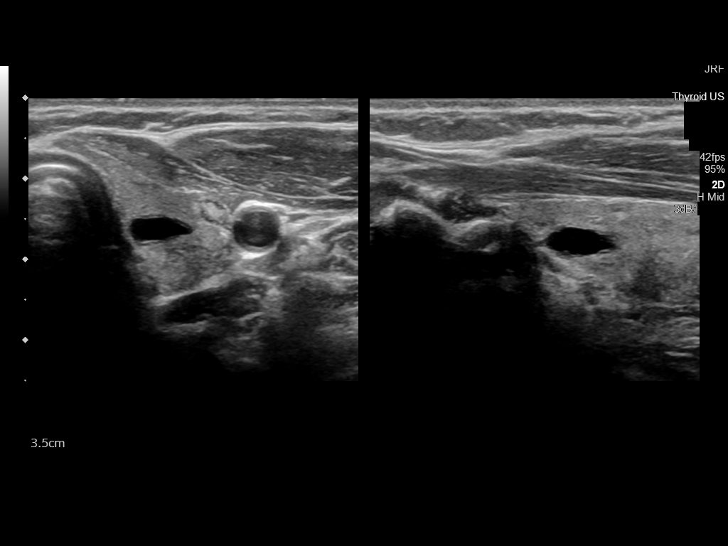
[im 36/40]
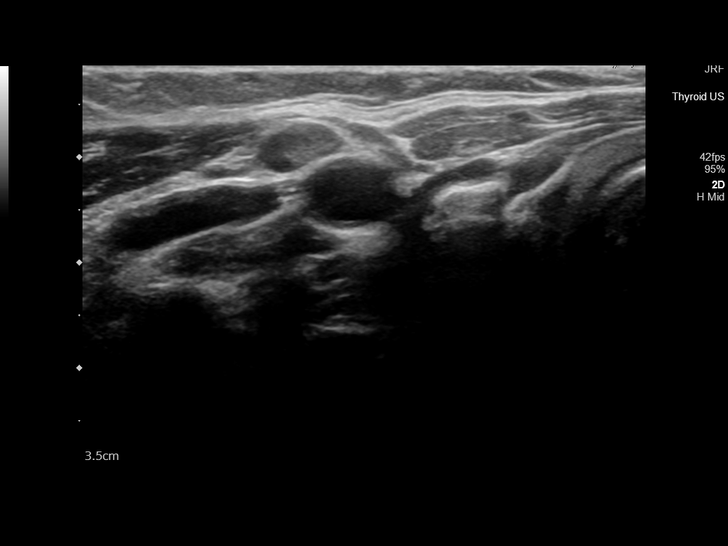
[im 40/40]
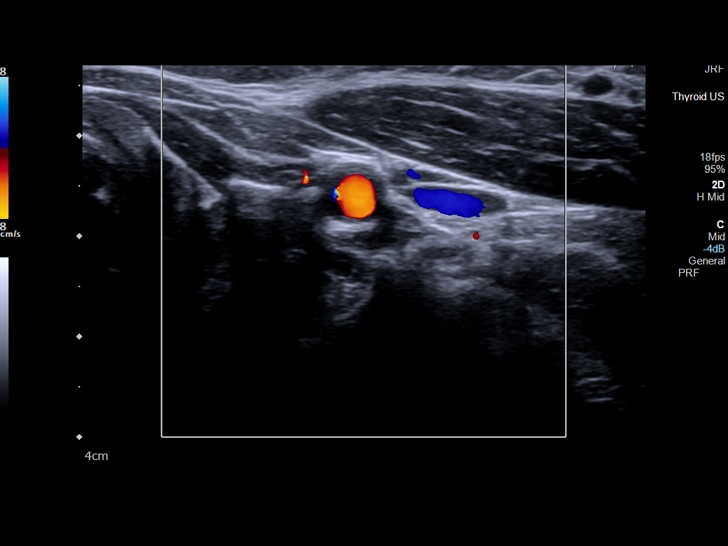

[14 of 25 positions shown; findings below may reference images not displayed]

FINDINGS: Parenchymal Echotexture: Moderately heterogeneous

Isthmus: 0.3 cm

Right lobe: 3.3 x 1.5 x 1.5 cm

Left lobe: 3.7 x 1.6 x 1.4 cm

_________________________________________________________

Estimated total number of nodules >/= 1 cm: 0

Number of spongiform nodules >/=  2 cm not described below (TR1): 0

Number of mixed cystic and solid nodules >/= 1.5 cm not described
below (TR2): 0

_________________________________________________________

0.9 cm cystic nodule in the mid left thyroid lobe does not meet
criteria for FNA or imaging follow-up.
IMPRESSION: No suspicious thyroid nodules.

The above is in keeping with the ACR TI-RADS recommendations - [HOSPITAL] 0029;[DATE].

## 2022-11-28 DIAGNOSIS — R7309 Other abnormal glucose: Secondary | ICD-10-CM | POA: Diagnosis not present

## 2022-12-08 ENCOUNTER — Ambulatory Visit: Payer: BC Managed Care – PPO | Admitting: Family Medicine

## 2022-12-08 ENCOUNTER — Other Ambulatory Visit: Payer: Self-pay | Admitting: Family Medicine

## 2022-12-08 ENCOUNTER — Encounter: Payer: Self-pay | Admitting: Family Medicine

## 2022-12-08 VITALS — BP 130/64 | HR 84 | Temp 98.2°F | Resp 16 | Ht 59.0 in | Wt 135.4 lb

## 2022-12-08 DIAGNOSIS — E039 Hypothyroidism, unspecified: Secondary | ICD-10-CM | POA: Diagnosis not present

## 2022-12-08 DIAGNOSIS — E559 Vitamin D deficiency, unspecified: Secondary | ICD-10-CM

## 2022-12-08 DIAGNOSIS — Z780 Asymptomatic menopausal state: Secondary | ICD-10-CM | POA: Insufficient documentation

## 2022-12-08 DIAGNOSIS — I10 Essential (primary) hypertension: Secondary | ICD-10-CM

## 2022-12-08 DIAGNOSIS — R7309 Other abnormal glucose: Secondary | ICD-10-CM | POA: Insufficient documentation

## 2022-12-08 DIAGNOSIS — Z8679 Personal history of other diseases of the circulatory system: Secondary | ICD-10-CM | POA: Insufficient documentation

## 2022-12-08 DIAGNOSIS — Z23 Encounter for immunization: Secondary | ICD-10-CM | POA: Diagnosis not present

## 2022-12-08 DIAGNOSIS — E785 Hyperlipidemia, unspecified: Secondary | ICD-10-CM | POA: Diagnosis not present

## 2022-12-08 DIAGNOSIS — Z1231 Encounter for screening mammogram for malignant neoplasm of breast: Secondary | ICD-10-CM | POA: Insufficient documentation

## 2022-12-08 DIAGNOSIS — E539 Vitamin B deficiency, unspecified: Secondary | ICD-10-CM

## 2022-12-08 MED ORDER — VITAMIN D (ERGOCALCIFEROL) 1.25 MG (50000 UNIT) PO CAPS
50000.0000 [IU] | ORAL_CAPSULE | ORAL | 1 refills | Status: DC
Start: 2022-12-08 — End: 2023-05-12

## 2022-12-08 MED ORDER — AMLODIPINE BESYLATE 5 MG PO TABS
5.0000 mg | ORAL_TABLET | Freq: Every day | ORAL | 3 refills | Status: DC
Start: 2022-12-08 — End: 2023-06-30

## 2022-12-08 MED ORDER — LEVOTHYROXINE SODIUM 50 MCG PO TABS
ORAL_TABLET | ORAL | 3 refills | Status: DC
Start: 2022-12-08 — End: 2023-05-12

## 2022-12-08 NOTE — Assessment & Plan Note (Signed)
No recent episodes. Metoprolol prescribed as needed, but patient has not taken. -No changes to current plan. -Continue to follow up with Cardiology

## 2022-12-08 NOTE — Patient Instructions (Addendum)
It was a pleasure meeting you today. Thank you for allowing me to take part in your health care.  Our goals for today as we discussed include:  Refills sent for medications  Compression stockings to help with lower extremity swelling  Limit sodium intake   This is a list of the screening recommended for you and due dates:  Health Maintenance  Topic Date Due   Zoster (Shingles) Vaccine (1 of 2) Never done   COVID-19 Vaccine (4 - 2023-24 season) 09/28/2022   Mammogram  07/15/2024   Pap with HPV screening  07/15/2027   Colon Cancer Screening  02/10/2029   DTaP/Tdap/Td vaccine (4 - Td or Tdap) 01/18/2030   Flu Shot  Completed   Hepatitis C Screening  Completed   HIV Screening  Completed   HPV Vaccine  Aged Out    Referral sent for Mammogram and Dexa scan. Please call to schedule appointment.  Due 12/11/2023 [facility] [address] [place], [place] [address] [phone]    Schedule fasting blood work 1 week prior to annual visit in 6 months  Follow up as needed  If you have any questions or concerns, please do not hesitate to call the office at [phone].  I look forward to our next visit and until then take care and stay safe.  Regards,   Dana Allan, MD   [facility]

## 2022-12-08 NOTE — Progress Notes (Signed)
SUBJECTIVE:   Chief Complaint  Patient presents with   Medical Management of Chronic Issues    6 month follow up on HTN and TSH   HPI Presents to clinic for follow up chronic disease management  Discussed the use of AI scribe software for clinical note transcription with the patient, who gave verbal consent to proceed.  History of Present Illness The patient, with a history of hypertension and hypothyroidism, presents for a follow-up visit to review lab results and discuss medication refills. The patient's most recent labs were conducted approximately six months ago, with results indicating stable thyroid function. The patient is currently on levothyroxine for thyroid management and amlodipine for hypertension. The patient reports that the amlodipine dosage was increased by a nurse practitioner, although the specific reason for this adjustment is unclear. The patient's home blood pressure readings have been consistent with today's reading of 130/64.  The patient also reports not taking prescribed metoprolol, which was given as a precautionary measure for palpitations experienced in February. The patient had a cardiology follow-up two months ago, with no significant findings and a recommendation for another follow-up in six months.  The patient is also on vitamin D, calcium, and vitamin B12 supplements, which are taken intermittently throughout the week. The patient reports needing a refill on the vitamin D supplement.  The patient has been experiencing some diarrhea, which she attributes to dietary choices. There have been no further episodes of heart palpitations. The patient also reports some leg swelling, which she believes may be related to the amlodipine. The patient has a bruise on one leg, which she attributes to a minor injury.  The patient's preventative health measures are up to date, including a recent flu vaccine and mammogram. The patient inquires about bone density testing, as  she had one done approximately 15 years ago and has been experiencing some bone discomfort. The patient will be turning 62 soon.    PERTINENT PMH / PSH: As above  OBJECTIVE:  BP 130/64   Pulse 84   Temp 98.2 F (36.8 C)   Resp 16   Ht 4\' 11"  (1.499 m)   Wt 135 lb 6 oz (61.4 kg)   SpO2 100%   BMI 27.34 kg/m    Physical Exam Vitals reviewed.  Constitutional:      General: She is not in acute distress.    Appearance: She is not ill-appearing.  HENT:     Head: Normocephalic.     Right Ear: Tympanic membrane, ear canal and external ear normal.     Left Ear: Tympanic membrane, ear canal and external ear normal.     Nose: Nose normal.     Mouth/Throat:     Mouth: Mucous membranes are moist.  Eyes:     Extraocular Movements: Extraocular movements intact.     Conjunctiva/sclera: Conjunctivae normal.     Pupils: Pupils are equal, round, and reactive to light.  Neck:     Thyroid: No thyromegaly or thyroid tenderness.     Vascular: No carotid bruit.  Cardiovascular:     Rate and Rhythm: Normal rate and regular rhythm.     Pulses: Normal pulses.     Heart sounds: Normal heart sounds.  Pulmonary:     Effort: Pulmonary effort is normal.     Breath sounds: Normal breath sounds.  Abdominal:     General: Bowel sounds are normal. There is no distension.     Palpations: Abdomen is soft.     Tenderness: There  is no abdominal tenderness. There is no right CVA tenderness, left CVA tenderness, guarding or rebound.  Musculoskeletal:        General: Normal range of motion.     Cervical back: Normal range of motion.     Right lower leg: No edema.     Left lower leg: No edema.  Lymphadenopathy:     Cervical: No cervical adenopathy.  Skin:    Capillary Refill: Capillary refill takes less than 2 seconds.  Neurological:     General: No focal deficit present.     Mental Status: She is alert and oriented to person, place, and time. Mental status is at baseline.     Motor: No weakness.   Psychiatric:        Mood and Affect: Mood normal.        Behavior: Behavior normal.        Thought Content: Thought content normal.        Judgment: Judgment normal.        12/08/2022   10:09 AM 06/04/2022   11:12 AM 05/16/2020    8:13 AM 04/22/2019    1:10 PM  Depression screen PHQ 2/9  Decreased Interest 0 0 0 0  Down, Depressed, Hopeless 0 0 0 0  PHQ - 2 Score 0 0 0 0  Altered sleeping 1 1    Tired, decreased energy 1 1    Change in appetite 0 0    Feeling bad or failure about yourself  0 0    Trouble concentrating 0 0    Moving slowly or fidgety/restless 0 0    Suicidal thoughts 0 0    PHQ-9 Score 2 2    Difficult doing work/chores Not difficult at all Not difficult at all        12/08/2022   10:09 AM 06/04/2022   11:13 AM 04/22/2019    1:10 PM  GAD 7 : Generalized Anxiety Score  Nervous, Anxious, on Edge 1 0 0  Control/stop worrying 0 0 0  Worry too much - different things 0 0 0  Trouble relaxing 0 0 0  Restless 0 0 0  Easily annoyed or irritable 0 0 0  Afraid - awful might happen 0 0 0  Total GAD 7 Score 1 0 0  Anxiety Difficulty Not difficult at all Not difficult at all Not difficult at all    ASSESSMENT/PLAN:  Hypothyroidism, unspecified type Assessment & Plan: Stable on Levothyroxine, with last TSH at 2.29. -Refill Levothyroxine at current dose.   Orders: -     Levothyroxine Sodium; TAKE 1 TABLET BY MOUTH EVERY MORNING 30 MINUTES BEFORE BREAKFAST  Dispense: 90 tablet; Refill: 3 -     TSH; Future  Need for influenza vaccination -     Flu vaccine trivalent PF, 6mos and older(Flulaval,Afluria,Fluarix,Fluzone)  Essential hypertension Assessment & Plan: Stable on Amlodipine 5mg  daily, with home readings around 130. Mild bilateral lower extremity edema noted, possibly secondary to Amlodipine. -Continue Amlodipine 5mg  daily. -Consider adding a diuretic or adjusting medication if edema persists despite lifestyle modifications. -Encourage reduced salt intake  and regular movement throughout the day.  Orders: -     amLODIPine Besylate; Take 1 tablet (5 mg total) by mouth daily.  Dispense: 90 tablet; Refill: 3 -     Comprehensive metabolic panel; Future -     CBC; Future  Vitamin D deficiency Assessment & Plan: On weekly high-dose Vitamin D supplementation. -Continue weekly Vitamin D supplementation. -Check refill status and provide  if needed. -Plan to recheck Vitamin D level at next annual visit   Orders: -     Vitamin D (Ergocalciferol); Take 1 capsule (50,000 Units total) by mouth every 7 (seven) days.  Dispense: 12 capsule; Refill: 1 -     VITAMIN D 25 Hydroxy (Vit-D Deficiency, Fractures); Future  Breast cancer screening by mammogram -     3D Screening Mammogram, Left and Right; Future  Hyperlipidemia, unspecified hyperlipidemia type -     Lipid panel; Future  Vitamin B deficiency -     Vitamin B12; Future  Abnormal glucose -     Hemoglobin A1c; Future  Postmenopausal estrogen deficiency -     DG Bone Density; Future  History of palpitations in adulthood Assessment & Plan: No recent episodes. Metoprolol prescribed as needed, but patient has not taken. -No changes to current plan. -Continue to follow up with Cardiology    General Health Maintenance -Plan for DEXA scan at age 19, future order placed - Up-to-date on mammogram, Pap smear, and colonoscopy. -Check status of pneumonia vaccine. -Return for annual visit in April or May 2025. -Order labs prior to next visit. -Consider earlier follow-up if changes in health status.    PDMP reviewed  Return in about 6 months (around 06/07/2023), or if symptoms worsen or fail to improve, for PCP.  Dana Allan, MD

## 2022-12-08 NOTE — Assessment & Plan Note (Signed)
Stable on Levothyroxine, with last TSH at 2.29. -Refill Levothyroxine at current dose.

## 2022-12-08 NOTE — Assessment & Plan Note (Signed)
On weekly high-dose Vitamin D supplementation. -Continue weekly Vitamin D supplementation. -Check refill status and provide if needed. -Plan to recheck Vitamin D level at next annual visit

## 2022-12-08 NOTE — Assessment & Plan Note (Signed)
Stable on Amlodipine 5mg  daily, with home readings around 130. Mild bilateral lower extremity edema noted, possibly secondary to Amlodipine. -Continue Amlodipine 5mg  daily. -Consider adding a diuretic or adjusting medication if edema persists despite lifestyle modifications. -Encourage reduced salt intake and regular movement throughout the day.

## 2023-01-19 DIAGNOSIS — M79671 Pain in right foot: Secondary | ICD-10-CM | POA: Diagnosis not present

## 2023-01-19 DIAGNOSIS — M25571 Pain in right ankle and joints of right foot: Secondary | ICD-10-CM | POA: Diagnosis not present

## 2023-01-19 DIAGNOSIS — M79672 Pain in left foot: Secondary | ICD-10-CM | POA: Diagnosis not present

## 2023-01-19 DIAGNOSIS — S93401A Sprain of unspecified ligament of right ankle, initial encounter: Secondary | ICD-10-CM | POA: Diagnosis not present

## 2023-01-19 DIAGNOSIS — M2012 Hallux valgus (acquired), left foot: Secondary | ICD-10-CM | POA: Diagnosis not present

## 2023-01-27 DIAGNOSIS — R448 Other symptoms and signs involving general sensations and perceptions: Secondary | ICD-10-CM | POA: Diagnosis not present

## 2023-04-13 DIAGNOSIS — I38 Endocarditis, valve unspecified: Secondary | ICD-10-CM | POA: Diagnosis not present

## 2023-04-13 DIAGNOSIS — I10 Essential (primary) hypertension: Secondary | ICD-10-CM | POA: Diagnosis not present

## 2023-04-13 DIAGNOSIS — R002 Palpitations: Secondary | ICD-10-CM | POA: Diagnosis not present

## 2023-04-13 DIAGNOSIS — R0602 Shortness of breath: Secondary | ICD-10-CM | POA: Diagnosis not present

## 2023-04-13 DIAGNOSIS — I272 Pulmonary hypertension, unspecified: Secondary | ICD-10-CM | POA: Diagnosis not present

## 2023-04-13 DIAGNOSIS — R011 Cardiac murmur, unspecified: Secondary | ICD-10-CM | POA: Diagnosis not present

## 2023-04-21 DIAGNOSIS — H6982 Other specified disorders of Eustachian tube, left ear: Secondary | ICD-10-CM | POA: Diagnosis not present

## 2023-04-21 DIAGNOSIS — J301 Allergic rhinitis due to pollen: Secondary | ICD-10-CM | POA: Diagnosis not present

## 2023-05-12 ENCOUNTER — Other Ambulatory Visit: Payer: Self-pay | Admitting: Family Medicine

## 2023-05-12 DIAGNOSIS — E559 Vitamin D deficiency, unspecified: Secondary | ICD-10-CM

## 2023-05-12 DIAGNOSIS — E039 Hypothyroidism, unspecified: Secondary | ICD-10-CM

## 2023-05-12 MED ORDER — LEVOTHYROXINE SODIUM 50 MCG PO TABS: ORAL_TABLET | ORAL | 3 refills | Status: DC

## 2023-05-12 NOTE — Telephone Encounter (Signed)
 Copied from CRM [id number]. Topic: Clinical - Medication Refill >> May 12, 2023 10:37 AM Deaijah H wrote: Most Recent Primary Care Visit:  Provider: WALSH, TANYA  Department: [facility]  Visit Type: OFFICE VISIT  Date: 12/08/2022  Medication: levothyroxine (SYNTHROID) 50 MCG tablet  Has the patient contacted their pharmacy? Yes (Agent: If no, request that the patient contact the pharmacy for the refill. If patient does not wish to contact the pharmacy document the reason why and proceed with request.) (Agent: If yes, when and what did the pharmacy advise?)  Is this the correct pharmacy for this prescription? Yes If no, delete pharmacy and type the correct one.  This is the patient's preferred pharmacy:  [facility] [place], [place] - [address] [place] [address] Phone: [phone] Fax: [phone]    [id number] [place], [place] - [place] [address] AT [facility] [address] [place] [address] Phone: [phone] Fax: [phone]    Has the prescription been filled recently? Yes  Is the patient out of the medication? No, 2 pills left  Has the patient been seen for an appointment in the last year OR does the patient have an upcoming appointment? Yes  Can we respond through MyChart? Yes  Agent: Please be advised that Rx refills may take up to 3 business days. We ask that you follow-up with your pharmacy.

## 2023-06-24 ENCOUNTER — Ambulatory Visit (INDEPENDENT_AMBULATORY_CARE_PROVIDER_SITE_OTHER): Admitting: Family Medicine

## 2023-06-24 ENCOUNTER — Encounter: Payer: Self-pay | Admitting: Family Medicine

## 2023-06-24 VITALS — BP 138/74 | HR 65 | Temp 98.2°F | Resp 20 | Ht 59.25 in | Wt 132.4 lb

## 2023-06-24 DIAGNOSIS — E539 Vitamin B deficiency, unspecified: Secondary | ICD-10-CM | POA: Diagnosis not present

## 2023-06-24 DIAGNOSIS — E039 Hypothyroidism, unspecified: Secondary | ICD-10-CM | POA: Diagnosis not present

## 2023-06-24 DIAGNOSIS — R7309 Other abnormal glucose: Secondary | ICD-10-CM

## 2023-06-24 DIAGNOSIS — E785 Hyperlipidemia, unspecified: Secondary | ICD-10-CM

## 2023-06-24 DIAGNOSIS — I10 Essential (primary) hypertension: Secondary | ICD-10-CM | POA: Diagnosis not present

## 2023-06-24 DIAGNOSIS — Z0000 Encounter for general adult medical examination without abnormal findings: Secondary | ICD-10-CM | POA: Diagnosis not present

## 2023-06-24 DIAGNOSIS — E559 Vitamin D deficiency, unspecified: Secondary | ICD-10-CM

## 2023-06-24 NOTE — Patient Instructions (Addendum)
 It was a pleasure meeting you today. Thank you for allowing me to take part in your health care.  Our goals for today as we discussed include:  Schedule lab appointment.  Fast for 10 hours  Referral sent for Mammogram and Dexa scan. Please call to schedule appointment. [facility] [address] [place], [place] [address] [phone]      This is a list of the screening recommended for you and due dates:  Health Maintenance  Topic Date Due   Zoster (Shingles) Vaccine (1 of 2) Never done   COVID-19 Vaccine (4 - 2024-25 season) 09/28/2022   Mammogram  07/16/2023   Flu Shot  08/28/2023   Pap with HPV screening  07/15/2027   Colon Cancer Screening  02/10/2029   DTaP/Tdap/Td vaccine (4 - Td or Tdap) 01/18/2030   Hepatitis C Screening  Completed   HIV Screening  Completed   HPV Vaccine  Aged Out   Meningitis B Vaccine  Aged Out      If you have any questions or concerns, please do not hesitate to call the office at [phone].  I look forward to our next visit and until then take care and stay safe.  Regards,   Valli Gaw, MD   [facility]

## 2023-06-24 NOTE — Progress Notes (Signed)
 SUBJECTIVE:   Chief Complaint  Patient presents with   Annual Exam   HPI Presents for annual physical  Discussed the use of AI scribe software for clinical note transcription with the patient, who gave verbal consent to proceed.  History of Present Illness Susan Allison is a 63 year old female who presents for an annual physical exam.  She experiences occasional sensations in her hip bones, describing it as feeling like 'sitting on a heating pad.' She attributes this to aging but does not express significant concern.  She has a history of temporomandibular joint disorder (TMJ) and experiences occasional jawbone cracking.  Her blood pressure was noted to be 123/79 at home, and she is currently taking Norvasc  (amlodipine ) for hypertension. Her blood pressure has been stable at home. She has previously been prescribed Loprox or metoprolol  but has never taken it.  She is on thyroid  medication, which she takes every morning on an empty stomach, and she recently refilled her prescription last week. She is awaiting lab results to determine if any adjustments are needed.  She takes vitamin D  weekly and calcium carbonate once or twice a week. Vitamin D  levels are to be rechecked.  She uses Flonase  and does not report any chest pain, shortness of breath, or significant leg swelling. She elevates her legs daily to manage mild swelling.  She has a history of colonoscopy, which is not due for another six years, and she had a Pap smear last year.     PERTINENT PMH / PSH: As above  OBJECTIVE:  BP 138/74   Pulse 65   Temp 98.2 F (36.8 C)   Resp 20   Ht 4' 11.25" (1.505 m)   Wt 132 lb 6 oz (60 kg)   SpO2 99%   BMI 26.51 kg/m    Physical Exam Vitals reviewed.  Constitutional:      General: She is not in acute distress.    Appearance: Normal appearance. She is not ill-appearing, toxic-appearing or diaphoretic.  HENT:     Right Ear: Tympanic membrane, ear canal and external ear  normal.     Left Ear: Tympanic membrane, ear canal and external ear normal.     Nose: Nose normal.     Mouth/Throat:     Mouth: Mucous membranes are moist.  Eyes:     General:        Right eye: No discharge.        Left eye: No discharge.     Conjunctiva/sclera: Conjunctivae normal.  Neck:     Thyroid : No thyromegaly or thyroid  tenderness.  Cardiovascular:     Rate and Rhythm: Normal rate and regular rhythm.     Heart sounds: Normal heart sounds.  Pulmonary:     Effort: Pulmonary effort is normal.     Breath sounds: Normal breath sounds.  Abdominal:     General: Bowel sounds are normal.  Musculoskeletal:        General: Normal range of motion.  Skin:    General: Skin is warm and dry.  Neurological:     General: No focal deficit present.     Mental Status: She is alert and oriented to person, place, and time. Mental status is at baseline.  Psychiatric:        Mood and Affect: Mood normal.        Behavior: Behavior normal.        Thought Content: Thought content normal.        Judgment: Judgment  normal.           06/24/2023    1:43 PM 12/08/2022   10:09 AM 06/04/2022   11:12 AM 05/16/2020    8:13 AM 04/22/2019    1:10 PM  Depression screen PHQ 2/9  Decreased Interest 0 0 0 0 0  Down, Depressed, Hopeless 0 0 0 0 0  PHQ - 2 Score 0 0 0 0 0  Altered sleeping 1 1 1     Tired, decreased energy 0 1 1    Change in appetite 0 0 0    Feeling bad or failure about yourself  0 0 0    Trouble concentrating 0 0 0    Moving slowly or fidgety/restless 0 0 0    Suicidal thoughts 0 0 0    PHQ-9 Score 1 2 2     Difficult doing work/chores Not difficult at all Not difficult at all Not difficult at all        06/24/2023    1:43 PM 12/08/2022   10:09 AM 06/04/2022   11:13 AM 04/22/2019    1:10 PM  GAD 7 : Generalized Anxiety Score  Nervous, Anxious, on Edge 0 1 0 0  Control/stop worrying 0 0 0 0  Worry too much - different things 0 0 0 0  Trouble relaxing 0 0 0 0  Restless 0 0 0 0   Easily annoyed or irritable 0 0 0 0  Afraid - awful might happen 0 0 0 0  Total GAD 7 Score 0 1 0 0  Anxiety Difficulty Not difficult at all Not difficult at all Not difficult at all Not difficult at all    ASSESSMENT/PLAN:  Annual physical exam Assessment & Plan: Annual labs reviewed Mammogram scheduled 06/25 Recommend regular self breast exams Colonoscopy up to date Dexa at age 3. Order has been placed Cervical cancer screening up to date.  Due 2029.  Follows with OBGYN Depression screening negative    Essential hypertension Assessment & Plan: Well controlled. -Continue Amlodipine  5 mg daily. -Limit salt intake and regular movement throughout the day.  Orders: -     CBC -     Comprehensive metabolic panel with GFR; Future -     CBC with Differential/Platelet; Future -     amLODIPine  Besylate; Take 1 tablet (5 mg total) by mouth daily.  Dispense: 90 tablet; Refill: 3  Abnormal glucose -     Hemoglobin A1c; Future  Hyperlipidemia, unspecified hyperlipidemia type Assessment & Plan: Chronic.   The 10-year ASCVD risk score (Arnett DK, et al., 2019) is: 9.3%  Consider statin  Check lipids     Orders: -     Lipid panel; Future  Hypothyroidism, unspecified type Assessment & Plan: Stable  -Refill Levothyroxine  50 mcg daily -Check TSH   Orders: -     TSH; Future -     Levothyroxine  Sodium; TAKE 1 TABLET BY MOUTH EVERY MORNING 30 MINUTES BEFORE BREAKFAST  Dispense: 90 tablet; Refill: 3  Vitamin D  deficiency Assessment & Plan: On weekly high-dose Vitamin D  supplementation. -Recheck Vitamin D  level   Orders: -     VITAMIN D  25 Hydroxy (Vit-D Deficiency, Fractures); Future  Vitamin B deficiency Assessment & Plan: Check Vitamin B 12 level  Orders: -     Vitamin B12; Future     PDMP reviewed  Return if symptoms worsen or fail to improve, for PCP.  Valli Gaw, MD

## 2023-06-25 ENCOUNTER — Other Ambulatory Visit (INDEPENDENT_AMBULATORY_CARE_PROVIDER_SITE_OTHER)

## 2023-06-25 DIAGNOSIS — I10 Essential (primary) hypertension: Secondary | ICD-10-CM | POA: Diagnosis not present

## 2023-06-25 DIAGNOSIS — R7309 Other abnormal glucose: Secondary | ICD-10-CM

## 2023-06-25 DIAGNOSIS — E785 Hyperlipidemia, unspecified: Secondary | ICD-10-CM | POA: Diagnosis not present

## 2023-06-25 DIAGNOSIS — E539 Vitamin B deficiency, unspecified: Secondary | ICD-10-CM | POA: Diagnosis not present

## 2023-06-25 DIAGNOSIS — E039 Hypothyroidism, unspecified: Secondary | ICD-10-CM

## 2023-06-25 DIAGNOSIS — E559 Vitamin D deficiency, unspecified: Secondary | ICD-10-CM

## 2023-06-25 LAB — VITAMIN D 25 HYDROXY (VIT D DEFICIENCY, FRACTURES): VITD: 50.92 ng/mL (ref 30.00–100.00)

## 2023-06-25 LAB — CBC WITH DIFFERENTIAL/PLATELET
Basophils Absolute: 0 10*3/uL (ref 0.0–0.1)
Basophils Relative: 0.9 % (ref 0.0–3.0)
Eosinophils Absolute: 0.1 10*3/uL (ref 0.0–0.7)
Eosinophils Relative: 1.9 % (ref 0.0–5.0)
HCT: 38.9 % (ref 36.0–46.0)
Hemoglobin: 12.7 g/dL (ref 12.0–15.0)
Lymphocytes Relative: 45.2 % (ref 12.0–46.0)
Lymphs Abs: 1.6 10*3/uL (ref 0.7–4.0)
MCHC: 32.7 g/dL (ref 30.0–36.0)
MCV: 85.3 fl (ref 78.0–100.0)
Monocytes Absolute: 0.3 10*3/uL (ref 0.1–1.0)
Monocytes Relative: 9 % (ref 3.0–12.0)
Neutro Abs: 1.5 10*3/uL (ref 1.4–7.7)
Neutrophils Relative %: 43 % (ref 43.0–77.0)
Platelets: 269 10*3/uL (ref 150.0–400.0)
RBC: 4.56 Mil/uL (ref 3.87–5.11)
RDW: 14.7 % (ref 11.5–15.5)
WBC: 3.5 10*3/uL — ABNORMAL LOW (ref 4.0–10.5)

## 2023-06-25 LAB — COMPREHENSIVE METABOLIC PANEL WITH GFR
ALT: 14 U/L (ref 0–35)
AST: 20 U/L (ref 0–37)
Albumin: 4.4 g/dL (ref 3.5–5.2)
Alkaline Phosphatase: 105 U/L (ref 39–117)
BUN: 8 mg/dL (ref 6–23)
CO2: 28 meq/L (ref 19–32)
Calcium: 9.3 mg/dL (ref 8.4–10.5)
Chloride: 107 meq/L (ref 96–112)
Creatinine, Ser: 0.93 mg/dL (ref 0.40–1.20)
GFR: 64.94 mL/min (ref >60.00)
Glucose, Bld: 91 mg/dL (ref 70–99)
Potassium: 4.2 meq/L (ref 3.5–5.1)
Sodium: 141 meq/L (ref 135–145)
Total Bilirubin: 0.2 mg/dL (ref 0.2–1.2)
Total Protein: 7 g/dL (ref 6.0–8.3)

## 2023-06-25 LAB — HEMOGLOBIN A1C: Hgb A1c MFr Bld: 5.9 % (ref 4.6–6.5)

## 2023-06-25 LAB — LIPID PANEL
Cholesterol: 168 mg/dL (ref 0–200)
HDL: 46.6 mg/dL (ref >39.00)
LDL Cholesterol: 111 mg/dL — ABNORMAL HIGH (ref 0–99)
NonHDL: 121.23
Total CHOL/HDL Ratio: 4
Triglycerides: 53 mg/dL (ref 0.0–149.0)
VLDL: 10.6 mg/dL (ref 0.0–40.0)

## 2023-06-25 LAB — TSH: TSH: 1.5 u[IU]/mL (ref 0.35–5.50)

## 2023-06-25 LAB — VITAMIN B12: Vitamin B-12: 705 pg/mL (ref 211–911)

## 2023-06-29 MED ORDER — LEVOTHYROXINE SODIUM 50 MCG PO TABS
ORAL_TABLET | ORAL | 3 refills | Status: DC
Start: 2023-06-29 — End: 2023-09-04

## 2023-06-29 NOTE — Assessment & Plan Note (Signed)
 Check Vitamin B 12 level

## 2023-06-29 NOTE — Assessment & Plan Note (Signed)
 Stable  -Refill Levothyroxine  50 mcg daily -Check TSH

## 2023-06-29 NOTE — Assessment & Plan Note (Signed)
 Chronic.   The 10-year ASCVD risk score (Arnett DK, et al., 2019) is: 9.3%  Consider statin  Check lipids

## 2023-06-29 NOTE — Assessment & Plan Note (Signed)
 On weekly high-dose Vitamin D  supplementation. -Recheck Vitamin D  level

## 2023-06-30 ENCOUNTER — Ambulatory Visit: Payer: Self-pay | Admitting: Family Medicine

## 2023-06-30 ENCOUNTER — Encounter: Payer: Self-pay | Admitting: Family Medicine

## 2023-06-30 MED ORDER — AMLODIPINE BESYLATE 5 MG PO TABS: 5.0000 mg | ORAL_TABLET | Freq: Every day | ORAL | 3 refills | Status: DC

## 2023-06-30 NOTE — Assessment & Plan Note (Signed)
 Well controlled. -Continue Amlodipine  5 mg daily. -Limit salt intake and regular movement throughout the day.

## 2023-06-30 NOTE — Assessment & Plan Note (Addendum)
 Annual labs reviewed Mammogram scheduled 06/25 Recommend regular self breast exams Colonoscopy up to date Dexa at age 63. Order has been placed Cervical cancer screening up to date.  Due 2029.  Follows with OBGYN Depression screening negative

## 2023-07-16 ENCOUNTER — Encounter: Admitting: Family Medicine

## 2023-07-17 ENCOUNTER — Encounter

## 2023-07-20 NOTE — Progress Notes (Unsigned)
 PCP: Hope Merle, MD   No chief complaint on file.   HPI:      [name] is a 63 y.o. [phone] who LMP was No LMP recorded. Patient is postmenopausal., presents today for her annual examination.  Her menses are absent due to menopause. She does not have PMB. She does not have vasomotor sx.   Sex activity: Not sex active. She does not have vaginal dryness/pain/issues.  Last Pap: 07/15/22  Results were: no abnormalities /neg HPV DNA.  Hx of STDs: none  Last mammogram: 07/16/22  Results were: normal--routine follow-up in 12 months; has appt 6/25 There is a FH of breast cancer in her mat niece, genetic testing not indicated for pt. Pt unsure if niece had genetic testing. There is no FH of ovarian cancer. The patient does do self-breast exams. Has noticed LT breast/axilla ache intermittently for past 6 months. No meds needed. Pt thinks she may feel lymph node, no change in size. PCP ordered mammo/u/s but pt states unable to schedule with [facility]. Pt also with arthritis in LT shoulder.   Colonoscopy: colonoscopy 1/21 at [facility] without abnormalities.  Repeat due after 10 years per pt  Tobacco use: The patient denies current or previous tobacco use. Alcohol use: none  No drug use Exercise: mod active  She does get adequate calcium and Vitamin D  in her diet.  Labs with PCP.  Hx of mild heart murmur. Saw [facility] in past    Past Medical History:  Diagnosis Date   Arthritis    COVID-19    10/2020   GERD (gastroesophageal reflux disease)    Hypertension    Joint pain    Leukocytosis 09/26/2020   New dx   Mastodynia    Sinus problem     Past Surgical History:  Procedure Laterality Date   COLONOSCOPY  2011, 2021   ESOPHAGOGASTRODUODENOSCOPY N/A 08/14/2014   Procedure: ESOPHAGOGASTRODUODENOSCOPY (EGD);  Surgeon: Deward CINDERELLA Piedmont, MD;  Location: [facility] ENDOSCOPY;  Service: Gastroenterology;  Laterality: N/A;    Family History  Problem Relation Age of Onset   Hypertension Mother     Liver disease Mother        nash with cirrhosis   Other Mother        brain tumor    Prostate cancer Father 60   Arthritis/Rheumatoid Sister    Diabetes Brother    Heart attack Maternal Grandmother    Lupus Maternal Aunt    Breast cancer Other        69s, has contact    Social History   Socioeconomic History   Marital status: Married    Spouse name: Not on file   Number of children: 2   Years of education: Not on file   Highest education level: Bachelor's degree (e.g., BA, AB, BS)  Occupational History   Not on file  Tobacco Use   Smoking status: Never   Smokeless tobacco: Never  Vaping Use   Vaping status: Never Used  Substance and Sexual Activity   Alcohol use: Never   Drug use: Never   Sexual activity: Yes    Birth control/protection: Post-menopausal  Other Topics Concern   Not on file  Social History Narrative   Married.   2 children. 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    Lives at home with spouse & children   Left handed  Caffeine: no   Social Drivers of Corporate investment banker Strain: Medium Risk (01/19/2023)   Received from [facility]   Overall Financial Resource Strain (CARDIA)    Difficulty of Paying Living Expenses: Somewhat hard  Food Insecurity: Food Insecurity Present (01/19/2023)   Received from [facility]   Hunger Vital Sign    Worried About Running Out of Food in the Last Year: Sometimes true    Ran Out of Food in the Last Year: Sometimes true  Transportation Needs: No Transportation Needs (01/19/2023)   [facility] - [facility]    In the past 12 months, has lack of transportation kept you from medical appointments or from getting medications?: No    Lack of Transportation (Non-Medical): No  Physical Activity: Insufficiently Active (06/04/2022)   Exercise Vital Sign     Days of Exercise per Week: 3 days    Minutes of Exercise per Session: 10 min  Stress: No Stress Concern Present (06/04/2022)   [employer] of Occupational Health - Occupational Stress Questionnaire    Feeling of Stress : Only a little  Social Connections: Socially Integrated (06/04/2022)   Social Connection and Isolation Panel    Frequency of Communication with Friends and Family: More than three times a week    Frequency of Social Gatherings with Friends and Family: Once a week    Attends Religious Services: More than 4 times per year    Active Member of [employer] or Organizations: Yes    Attends Banker Meetings: 1 to 4 times per year    Marital Status: Married  Catering manager Violence: Not At Risk (06/04/2022)   Humiliation, Afraid, Rape, and Kick questionnaire    Fear of Current or Ex-Partner: No    Emotionally Abused: No    Physically Abused: No    Sexually Abused: No    No outpatient medications have been marked as taking for the 07/21/23 encounter (Appointment) with Jeanne Terrance B, PA-C.      ROS:  Review of Systems  Constitutional:  Negative for fatigue, fever and unexpected weight change.  Respiratory:  Negative for cough, shortness of breath and wheezing.   Cardiovascular:  Negative for chest pain, palpitations and leg swelling.  Gastrointestinal:  Negative for blood in stool, constipation, diarrhea, nausea and vomiting.  Endocrine: Negative for cold intolerance, heat intolerance and polyuria.  Genitourinary:  Negative for dyspareunia, dysuria, flank pain, frequency, genital sores, hematuria, menstrual problem, pelvic pain, urgency, vaginal bleeding, vaginal discharge and vaginal pain.  Musculoskeletal:  Positive for arthralgias. Negative for back pain, joint swelling and myalgias.  Skin:  Negative for rash.  Neurological:  Negative for dizziness, syncope, light-headedness, numbness and headaches.  Hematological:  Negative for adenopathy.   Psychiatric/Behavioral:  Negative for agitation, confusion, sleep disturbance and suicidal ideas. The patient is not nervous/anxious.      Objective: There were no vitals taken for this visit.   Physical Exam Constitutional:      Appearance: She is well-developed.  Genitourinary:     Vulva normal.     Genitourinary Comments: NEG BREAST/AXILLARY EXAM BILAT     Right Labia: No rash, tenderness or lesions.    Left Labia: No tenderness, lesions or rash.    No vaginal discharge, erythema or tenderness.      Right Adnexa: not tender and no mass present.    Left Adnexa: not tender and no mass present.    No cervical  friability or polyp.     Uterus is not enlarged or tender.  Breasts:    Right: No mass, nipple discharge, skin change or tenderness.     Left: No mass, nipple discharge, skin change or tenderness.  Neck:     Thyroid : No thyromegaly.   Cardiovascular:     Rate and Rhythm: Normal rate and regular rhythm.     Heart sounds: Murmur heard.     Systolic murmur is present with a grade of 2/6.     No diastolic murmur is present.  Pulmonary:     Effort: Pulmonary effort is normal.     Breath sounds: Normal breath sounds.  Abdominal:     Palpations: Abdomen is soft.     Tenderness: There is no abdominal tenderness. There is no guarding or rebound.   Musculoskeletal:        General: Normal range of motion.     Cervical back: Normal range of motion.  Lymphadenopathy:     Cervical: No cervical adenopathy.   Neurological:     General: No focal deficit present.     Mental Status: She is alert and oriented to person, place, and time.     Cranial Nerves: No cranial nerve deficit.   Skin:    General: Skin is warm and dry.   Psychiatric:        Mood and Affect: Mood normal.        Behavior: Behavior normal.        Thought Content: Thought content normal.        Judgment: Judgment normal.  Vitals reviewed.    Assessment/Plan: Encounter for annual routine gynecological  examination  Cervical cancer screening - Plan: Cytology - PAP  Screening for HPV (human papillomavirus) - Plan: Cytology - PAP  Encounter for screening mammogram for malignant neoplasm of breast - Plan: US  LIMITED ULTRASOUND INCLUDING AXILLA LEFT BREAST , US  LIMITED ULTRASOUND INCLUDING AXILLA RIGHT BREAST, MM 3D DIAGNOSTIC MAMMOGRAM BILATERAL BREAST,   Breast pain - Plan: US  LIMITED ULTRASOUND INCLUDING AXILLA LEFT BREAST , US  LIMITED ULTRASOUND INCLUDING AXILLA RIGHT BREAST, MM 3D DIAGNOSTIC MAMMOGRAM BILATERAL BREAST; pt to schedule mammo and u/s. Will f/u with results. Neg exam. If WNL, most likely related to shoulder.     GYN counsel breast self exam, mammography screening, menopause, adequate intake of calcium and vitamin D , diet and exercise    F/U  No follow-ups on file.  Eyva Califano B. Gurshaan Matsuoka, PA-C 07/20/2023 10:21 AM

## 2023-07-21 ENCOUNTER — Encounter: Payer: Self-pay | Admitting: Obstetrics and Gynecology

## 2023-07-21 ENCOUNTER — Ambulatory Visit (INDEPENDENT_AMBULATORY_CARE_PROVIDER_SITE_OTHER): Admitting: Obstetrics and Gynecology

## 2023-07-21 VITALS — BP 132/75 | HR 75 | Ht 59.0 in | Wt 133.0 lb

## 2023-07-21 DIAGNOSIS — Z1231 Encounter for screening mammogram for malignant neoplasm of breast: Secondary | ICD-10-CM

## 2023-07-21 DIAGNOSIS — Z01419 Encounter for gynecological examination (general) (routine) without abnormal findings: Secondary | ICD-10-CM

## 2023-07-21 NOTE — Patient Instructions (Signed)
 I value your feedback and you entrusting [place] with your care. If you get a [place] patient survey, I would appreciate you taking the time to let us know about your experience today. Thank you! ? ? ?

## 2023-07-22 ENCOUNTER — Ambulatory Visit
Admission: RE | Admit: 2023-07-22 | Discharge: 2023-07-22 | Disposition: A | Discharge status: Home / Self Care | Source: Ambulatory Visit | Attending: Family Medicine | Admitting: Family Medicine

## 2023-07-22 DIAGNOSIS — Z1231 Encounter for screening mammogram for malignant neoplasm of breast: Secondary | ICD-10-CM | POA: Diagnosis not present

## 2023-09-04 ENCOUNTER — Telehealth: Payer: Self-pay

## 2023-09-04 DIAGNOSIS — E039 Hypothyroidism, unspecified: Secondary | ICD-10-CM

## 2023-09-04 DIAGNOSIS — E559 Vitamin D deficiency, unspecified: Secondary | ICD-10-CM

## 2023-09-04 MED ORDER — LEVOTHYROXINE SODIUM 50 MCG PO TABS: ORAL_TABLET | ORAL | 0 refills | Status: DC

## 2023-09-04 NOTE — Telephone Encounter (Signed)
 Copied from CRM #[id number]. Topic: Clinical - Medication Refill >> Sep 04, 2023  9:51 AM [place] M wrote: Medication: Vitamin D , Ergocalciferol , (DRISDOL ) 1.25 MG (50000 UNIT) CAPS capsule ; levothyroxine  (SYNTHROID ) 50 MCG tablet  Has the patient contacted their pharmacy? Yes (Agent: If no, request that the patient contact the pharmacy for the refill. If patient does not wish to contact the pharmacy document the reason why and proceed with request.) (Agent: If yes, when and what did the pharmacy advise?)  This is the patient's preferred pharmacy:  [id number] - [place], [place] - [address] [place] [address] Phone: [phone] Fax: [phone]  Is this the correct pharmacy for this prescription? Yes If no, delete pharmacy and type the correct one.   Has the prescription been filled recently? Yes  Is the patient out of the medication? Yes  Has the patient been seen for an appointment in the last year OR does the patient have an upcoming appointment? Yes  Can we respond through MyChart? Yes  Agent: Please be advised that Rx refills may take up to 3 business days. We ask that you follow-up with your pharmacy.

## 2023-09-09 NOTE — Telephone Encounter (Unsigned)
 Copied from CRM #[id number]. Topic: Clinical - Medication Refill >> Sep 04, 2023  9:51 AM [place] M wrote: Medication: Vitamin D , Ergocalciferol , (DRISDOL ) 1.25 MG (50000 UNIT) CAPS capsule ; levothyroxine  (SYNTHROID ) 50 MCG tablet  Has the patient contacted their pharmacy? Yes (Agent: If no, request that the patient contact the pharmacy for the refill. If patient does not wish to contact the pharmacy document the reason why and proceed with request.) (Agent: If yes, when and what did the pharmacy advise?)  This is the patient's preferred pharmacy:  [id number] - [place], [place] - [address] [place] [address] Phone: [phone] Fax: [phone]  Is this the correct pharmacy for this prescription? Yes If no, delete pharmacy and type the correct one.   Has the prescription been filled recently? Yes  Is the patient out of the medication? Yes  Has the patient been seen for an appointment in the last year OR does the patient have an upcoming appointment? Yes  Can we respond through MyChart? Yes  Agent: Please be advised that Rx refills may take up to 3 business days. We ask that you follow-up with your pharmacy. >> Sep 09, 2023 11:54 AM Chiquita SQUIBB wrote: Patient is calling in stating that the pharmacy has not received her medications yet, please advise patient.

## 2023-09-11 NOTE — Telephone Encounter (Signed)
 Patient was made aware that she had a #90 day supply sent over back on 09/04/23. Also, that her Vitamin D were within normal limits from her last lab results on 06/25/23. Patient was advised to take OTC 2000U of Vitamin D until scheduled appointment. Pt verbalized understanding.

## 2023-09-11 NOTE — Telephone Encounter (Unsigned)
 Copied from CRM #[id number]. Topic: Clinical - Medication Refill >> Sep 11, 2023 10:07 AM Franky GRADE wrote: Patient is calling to follow up on the request, she is a previous patient of Dr.Walsh and has a transfer of care to [facility] but it's not until 2026.

## 2023-09-16 DIAGNOSIS — J301 Allergic rhinitis due to pollen: Secondary | ICD-10-CM | POA: Diagnosis not present

## 2023-09-16 DIAGNOSIS — H6982 Other specified disorders of Eustachian tube, left ear: Secondary | ICD-10-CM | POA: Diagnosis not present

## 2023-12-31 DIAGNOSIS — H40003 Preglaucoma, unspecified, bilateral: Secondary | ICD-10-CM | POA: Diagnosis not present

## 2023-12-31 DIAGNOSIS — Z0100 Encounter for examination of eyes and vision without abnormal findings: Secondary | ICD-10-CM | POA: Diagnosis not present

## 2023-12-31 DIAGNOSIS — H2513 Age-related nuclear cataract, bilateral: Secondary | ICD-10-CM | POA: Diagnosis not present

## 2024-03-02 ENCOUNTER — Ambulatory Visit

## 2024-03-02 VITALS — BP 130/70 | HR 88 | Temp 98.6°F | Ht 60.0 in | Wt 130.0 lb

## 2024-03-02 DIAGNOSIS — Z780 Asymptomatic menopausal state: Secondary | ICD-10-CM

## 2024-03-02 DIAGNOSIS — E039 Hypothyroidism, unspecified: Secondary | ICD-10-CM

## 2024-03-02 DIAGNOSIS — K8020 Calculus of gallbladder without cholecystitis without obstruction: Secondary | ICD-10-CM

## 2024-03-02 DIAGNOSIS — J301 Allergic rhinitis due to pollen: Secondary | ICD-10-CM

## 2024-03-02 DIAGNOSIS — Z8639 Personal history of other endocrine, nutritional and metabolic disease: Secondary | ICD-10-CM | POA: Insufficient documentation

## 2024-03-02 DIAGNOSIS — E782 Mixed hyperlipidemia: Secondary | ICD-10-CM

## 2024-03-02 DIAGNOSIS — E785 Hyperlipidemia, unspecified: Secondary | ICD-10-CM

## 2024-03-02 DIAGNOSIS — R7303 Prediabetes: Secondary | ICD-10-CM

## 2024-03-02 DIAGNOSIS — R7309 Other abnormal glucose: Secondary | ICD-10-CM

## 2024-03-02 DIAGNOSIS — I10 Essential (primary) hypertension: Secondary | ICD-10-CM

## 2024-03-02 DIAGNOSIS — D709 Neutropenia, unspecified: Secondary | ICD-10-CM

## 2024-03-02 MED ORDER — LEVOTHYROXINE SODIUM 50 MCG PO TABS: ORAL_TABLET | ORAL | 3 refills | Status: AC

## 2024-03-02 MED ORDER — AMLODIPINE BESYLATE 5 MG PO TABS: 5.0000 mg | ORAL_TABLET | Freq: Every day | ORAL | 3 refills | Status: AC

## 2024-03-02 NOTE — Patient Instructions (Addendum)
 YOUR BONE DENISTY SCAN (dexa)  IS DUE, PLEASE CALL AND GET THIS SCHEDULED! [facility] - call [phone]   Add DASH diet to your lifestyle to help with blood pressure. Goal BP in 130/80 or less. Check BP 2-3 times a week and please keep a log of this. Bring your BP cuff and home reading for a review during next visit.   Schedule fasting lab at your convenience. Welcome to medicare visit at your convenience as well.

## 2024-03-02 NOTE — Assessment & Plan Note (Addendum)
-   Blood pressure on arrival elevated, above goal.  Repeat blood pressure within goal.  - Continue amlodipine  5 mg daily.  Refill sent.  - Check CMP, urine microalbumin, future lab ordered. - Provided information on DASH diet. - Check blood pressure at home 2-3 times a week and keep a log. - Bring home blood pressure cuff and log to next visit. Orders:   amLODipine  (NORVASC ) 5 MG tablet; Take 1 tablet (5 mg total) by mouth daily.   Comprehensive metabolic panel with GFR; Future   Urine Microalbumin w/creat. ratio; Future

## 2024-03-02 NOTE — Assessment & Plan Note (Addendum)
 History of vitamin D  deficiency, previously treated with once weekly 50,000 units vitamin D  and currently taking vitamin D  1000 units weekly.  Will check vitamin D .  Future lab ordered. Orders:   Vitamin D  (25 hydroxy); Future

## 2024-03-02 NOTE — Assessment & Plan Note (Addendum)
 Blood glucose has been stable in the past.  Continue healthy lifestyle.  Check A1c. Orders:   Hemoglobin A1c; Future

## 2024-03-02 NOTE — Assessment & Plan Note (Addendum)
-   Chronic, stable - Previously seen hematologist Dr. Babara. Last visit 04/22/21 where patient was discharged from the hematology clinic with the recommendation to continue periodic CBC monitoring and to re-establish hematology care if labs worsen.  Was recommended to continue vitamin B12 1000 mcg few days a week.  - Repeat CBC, vitamin B12.  Continue vitamin B12 once weekly.  Management pending results.  Orders:   CBC with Differential/Platelet; Future   B12; Future

## 2024-03-02 NOTE — Assessment & Plan Note (Addendum)
-   Well-managed on levothyroxine  50 mcg daily. Continue.  Prescription sent.  Previous TSH stable.  - Ordered TSH.  - Also reviewed thyroid  ultrasound from 04/16/2020 which showed left mid thyroid  cyst not meeting criteria for FNA or imaging follow-up at that time.  Orders:   levothyroxine  (SYNTHROID ) 50 MCG tablet; TAKE 1 TABLET BY MOUTH EVERY MORNING 30 MINUTES BEFORE BREAKFAST   TSH; Future

## 2024-03-02 NOTE — Assessment & Plan Note (Deleted)
 Susan Allison

## 2024-03-02 NOTE — Assessment & Plan Note (Addendum)
 Qualifies for DEXA scan due to age and postmenopausal status to screen for osteoporosis.  DEXA ordered.  Phone number provided  to the patient to schedule for DEXA at her convenience. Orders:   DG Bone Density; Future

## 2024-03-02 NOTE — Assessment & Plan Note (Signed)
 Asymptomatic cholelithiasis, noted on RUQ ultrasound on 04/16/2020. Discussed potential need for surgical consultation if symptoms develop.

## 2024-03-02 NOTE — Progress Notes (Signed)
 "  Established Patient Office Visit TOC from Dr. Hope    Subjective  Patient ID: Susan Allison, female    DOB: 02-10-1960  Age: 64 y.o. MRN: [id number]  Chief Complaint  Patient presents with   Establish Care    Discussed the use of AI scribe software for clinical note transcription with the patient, who gave verbal consent to proceed.  History of Present Illness Susan Allison is a 64 year old female who presents for transfer of care from previous PCP and chronic medication management.  She has a history of hypertension, managed with daily amlodipine . Her home blood pressure monitoring shows a recent reading of 131/70 mmHg. She experiences no chest pain, leg swelling, or palpitations, except when rushing.   She maintains an active lifestyle with home exercises and walking, and her diet consists of home-cooked meals, avoiding frequent dining out.  She has hypothyroidism, managed with levothyroxine  for the past two to three years. She takes her medication in the morning and has not had thyroid  surgery, though she had biopsy done about 30 years ago which was reassuring.  She experiences seasonal allergies, using Zyrtec twice a week for itching, nasal Flonase , and moisturizer after showers for dry skin.  She has a history of vitamin D  deficiency, taking a weekly supplement, and vitamin B12 weekly due to a previously noted mildly low white blood cell count.  She has gallstones, not causing discomfort, prefers conservative management, opting to monitor the condition unless symptoms worsen.  No current issues with bowel movements or urination.  No new concerns.     ROS As per HPI    Objective:     BP 130/70   Pulse 88   Temp 98.6 F (37 C) (Oral)   Ht 5' (1.524 m)   Wt 130 lb (59 kg)   SpO2 99%   BMI 25.39 kg/m      03/02/2024    1:16 PM 06/24/2023    1:43 PM 12/08/2022   10:09 AM  Depression screen PHQ 2/9  Decreased Interest 0 0 0  Down, Depressed, Hopeless 0 0 0   PHQ - 2 Score 0 0 0  Altered sleeping 1 1 1   Tired, decreased energy 0 0 1  Change in appetite 0 0 0  Feeling bad or failure about yourself  0 0 0  Trouble concentrating 0 0 0  Moving slowly or fidgety/restless 0 0 0  Suicidal thoughts 0 0 0  PHQ-9 Score 1 1  2    Difficult doing work/chores Not difficult at all Not difficult at all Not difficult at all     Data saved with a previous flowsheet row definition      03/02/2024    1:16 PM 06/24/2023    1:43 PM 12/08/2022   10:09 AM 06/04/2022   11:13 AM  GAD 7 : Generalized Anxiety Score  Nervous, Anxious, on Edge 0 0  1  0   Control/stop worrying 0 0  0  0   Worry too much - different things 0 0  0  0   Trouble relaxing 0 0  0  0   Restless 0 0  0  0   Easily annoyed or irritable 0 0  0  0   Afraid - awful might happen 0 0  0  0   Total GAD 7 Score 0 0 1 0  Anxiety Difficulty Not difficult at all Not difficult at all Not difficult at all Not difficult at all  Data saved with a previous flowsheet row definition      03/02/2024    1:16 PM 06/24/2023    1:43 PM 12/08/2022   10:09 AM  Depression screen PHQ 2/9  Decreased Interest 0 0 0  Down, Depressed, Hopeless 0 0 0  PHQ - 2 Score 0 0 0  Altered sleeping 1 1 1   Tired, decreased energy 0 0 1  Change in appetite 0 0 0  Feeling bad or failure about yourself  0 0 0  Trouble concentrating 0 0 0  Moving slowly or fidgety/restless 0 0 0  Suicidal thoughts 0 0 0  PHQ-9 Score 1 1  2    Difficult doing work/chores Not difficult at all Not difficult at all Not difficult at all     Data saved with a previous flowsheet row definition      03/02/2024    1:16 PM 06/24/2023    1:43 PM 12/08/2022   10:09 AM 06/04/2022   11:13 AM  GAD 7 : Generalized Anxiety Score  Nervous, Anxious, on Edge 0 0  1  0   Control/stop worrying 0 0  0  0   Worry too much - different things 0 0  0  0   Trouble relaxing 0 0  0  0   Restless 0 0  0  0   Easily annoyed or irritable 0 0  0  0   Afraid - awful  might happen 0 0  0  0   Total GAD 7 Score 0 0 1 0  Anxiety Difficulty Not difficult at all Not difficult at all Not difficult at all Not difficult at all     Data saved with a previous flowsheet row definition   SDOH Screenings   Food Insecurity: Food Insecurity Present (01/19/2023)   Received from [facility]  Housing: Low Risk  (01/19/2023)   Received from [facility]  Transportation Needs: No Transportation Needs (01/19/2023)   Received from [facility]  Utilities: Not At Risk (01/19/2023)   Received from [facility]  Depression [phone]): Low Risk (03/02/2024)  Financial Resource Strain: Medium Risk (01/19/2023)   Received from [facility]  Physical Activity: Insufficiently Active (06/04/2022)  Social Connections: Socially Integrated (06/04/2022)  Stress: No Stress Concern Present (06/04/2022)  Tobacco Use: Low Risk (03/02/2024)     Physical Exam Constitutional:      General: She is not in acute distress.    Appearance: Normal appearance.  HENT:     Head: Normocephalic and atraumatic.     Mouth/Throat:     Mouth: Mucous membranes are moist.  Neck:     Thyroid : No thyroid  mass or thyroid  tenderness.  Cardiovascular:     Rate and Rhythm: Normal rate and regular rhythm.     Heart sounds: No murmur heard. Pulmonary:     Effort: Pulmonary effort is normal.     Breath sounds: Normal breath sounds. No wheezing.  Abdominal:     General: Bowel sounds are normal.     Palpations: Abdomen is soft.     Tenderness: There is no abdominal tenderness. There is no guarding.  Musculoskeletal:     Cervical back: Neck supple. No rigidity or tenderness.     Right lower leg: No edema.     Left lower leg: No edema.  Lymphadenopathy:     Cervical: No cervical adenopathy.  Skin:    General: Skin is warm.  Neurological:  Mental Status: She is alert and oriented to person, place, and time.  Psychiatric:         Mood and Affect: Mood normal.        Behavior: Behavior normal.        No results found for any visits on 03/02/24.  The 10-year ASCVD risk score (Arnett DK, et al., 2019) is: 8.4%     Assessment & Plan:  Recommended updating pneumonia and shingles vaccine, patient politely declined.  Heart rate slightly elevated on arrival.   Repeat was normal. Assessment & Plan Essential hypertension - Blood pressure on arrival elevated, above goal.  Repeat blood pressure within goal.  - Continue amlodipine  5 mg daily.  Refill sent.  - Check CMP, urine microalbumin, future lab ordered. - Provided information on DASH diet. - Check blood pressure at home 2-3 times a week and keep a log. - Bring home blood pressure cuff and log to next visit. Orders:   amLODipine  (NORVASC ) 5 MG tablet; Take 1 tablet (5 mg total) by mouth daily.   Comprehensive metabolic panel with GFR; Future   Urine Microalbumin w/creat. ratio; Future  Acquired hypothyroidism - Well-managed on levothyroxine  50 mcg daily. Continue.  Prescription sent.  Previous TSH stable.  - Ordered TSH.  - Also reviewed thyroid  ultrasound from 04/16/2020 which showed left mid thyroid  cyst not meeting criteria for FNA or imaging follow-up at that time.  Orders:   levothyroxine  (SYNTHROID ) 50 MCG tablet; TAKE 1 TABLET BY MOUTH EVERY MORNING 30 MINUTES BEFORE BREAKFAST   TSH; Future  Mixed hyperlipidemia - History of mildly elevated LDL cholesterol. The 10-year ASCVD risk score (Arnett DK, et al., 2019) is: 8.4% - No statin recommended at this time.  Continue healthy lifestyle choices.   - Will repeat fasting cholesterol levels, future lab order. Orders:   Lipid panel; Future  Neutropenia, unspecified type - Chronic, stable - Previously seen hematologist Dr. Babara. Last visit 04/22/21 where patient was discharged from the hematology clinic with the recommendation to continue periodic CBC monitoring and to re-establish hematology care if  labs worsen.  Was recommended to continue vitamin B12 1000 mcg few days a week.  - Repeat CBC, vitamin B12.  Continue vitamin B12 once weekly.  Management pending results.  Orders:   CBC with Differential/Platelet; Future   B12; Future  Prediabetes Blood glucose has been stable in the past.  Continue healthy lifestyle.  Check A1c. Orders:   Hemoglobin A1c; Future  Postmenopausal estrogen deficiency Qualifies for DEXA scan due to age and postmenopausal status to screen for osteoporosis.  DEXA ordered.  Phone number provided  to the patient to schedule for DEXA at her convenience. Orders:   DG Bone Density; Future  History of vitamin D  deficiency History of vitamin D  deficiency, previously treated with once weekly 50,000 units vitamin D  and currently taking vitamin D  1000 units weekly.  Will check vitamin D .  Future lab ordered. Orders:   Vitamin D  (25 hydroxy); Future  Seasonal allergic rhinitis due to pollen Managed with as needed nasal Flonase , cetirizine 10 mg as needed.  Continue    Gallstones Asymptomatic cholelithiasis, noted on RUQ ultrasound on 04/16/2020. Discussed potential need for surgical consultation if symptoms develop.     I personally spent a total of 40 minutes in the care of the patient today including preparing to see the patient, getting/reviewing separately obtained history, performing a medically appropriate exam/evaluation, counseling and educating, placing orders, documenting clinical information in the EHR, independently interpreting results, and  communicating results.  Return in about 3 months (around 05/30/2024) for Fasting labs at patient's convience. F/U for welcome to medicare in about 3 months.   Luke Shade, MD "

## 2024-03-02 NOTE — Assessment & Plan Note (Signed)
 Managed with as needed nasal Flonase , cetirizine 10 mg as needed.  Continue

## 2024-03-02 NOTE — Assessment & Plan Note (Addendum)
-   History of mildly elevated LDL cholesterol. The 10-year ASCVD risk score (Arnett DK, et al., 2019) is: 8.4% - No statin recommended at this time.  Continue healthy lifestyle choices.   - Will repeat fasting cholesterol levels, future lab order. Orders:   Lipid panel; Future

## 2024-03-10 ENCOUNTER — Other Ambulatory Visit

## 2024-03-31 ENCOUNTER — Other Ambulatory Visit

## 2024-06-06 ENCOUNTER — Encounter
